# Patient Record
Sex: Male | Born: 1960 | Hispanic: Yes | Marital: Single | Smoking: Former smoker
Health system: Southern US, Community
[De-identification: ages and names within clinical notes are randomized; demographics above are authoritative.]

## PROBLEM LIST (undated history)

## (undated) MED FILL — Doxorubicin HCl Inj 2 MG/ML: INTRAVENOUS | Qty: 11 | Status: AC

---

## 2006-11-22 ENCOUNTER — Ambulatory Visit: Payer: Self-pay | Admitting: Urology

## 2006-12-04 ENCOUNTER — Ambulatory Visit: Payer: Self-pay | Admitting: Internal Medicine

## 2006-12-18 ENCOUNTER — Ambulatory Visit: Payer: Self-pay | Admitting: Family Medicine

## 2006-12-29 ENCOUNTER — Ambulatory Visit: Payer: Self-pay | Admitting: Internal Medicine

## 2007-01-02 ENCOUNTER — Ambulatory Visit: Payer: Self-pay | Admitting: Gastroenterology

## 2007-01-12 ENCOUNTER — Ambulatory Visit: Payer: Self-pay | Admitting: Internal Medicine

## 2007-12-11 IMAGING — US US RENAL KIDNEY
1 series · 17 of 19 positions shown · non-contrast
Comparison: none

REASON FOR EXAM: Flank pain
COMMENTS:

[Series 1: us renal kidney · 17 of 19 slices shown]
[im 1/19]
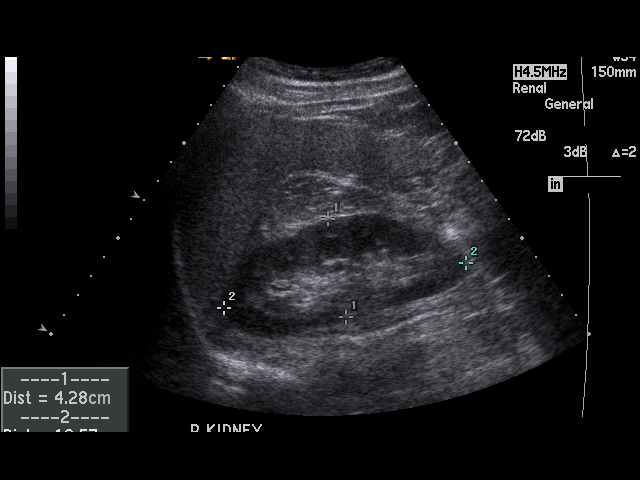
[im 2/19]
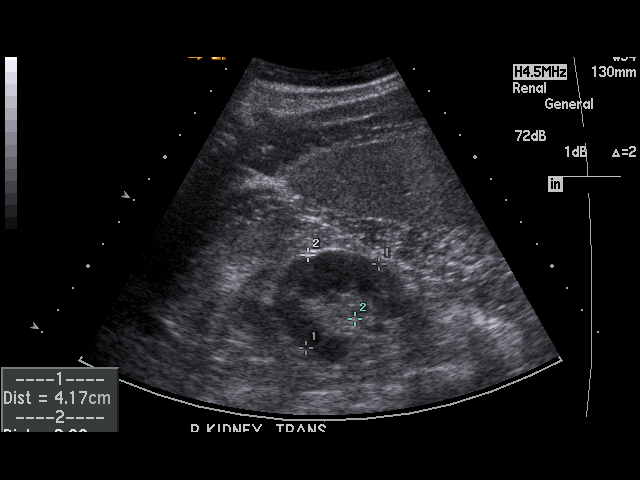
[im 3/19]
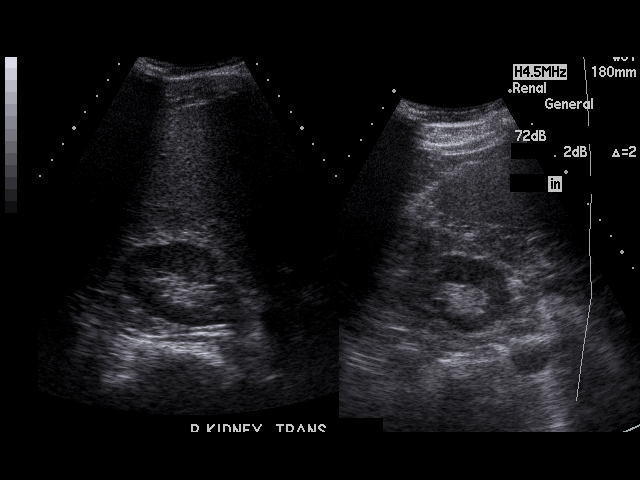
[im 4/19]
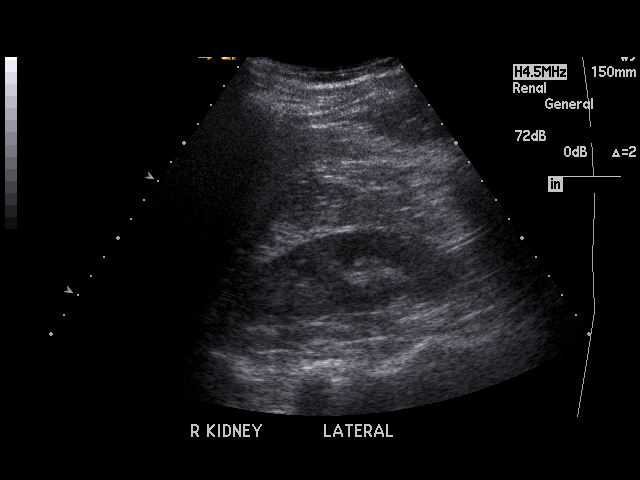
[im 6/19]
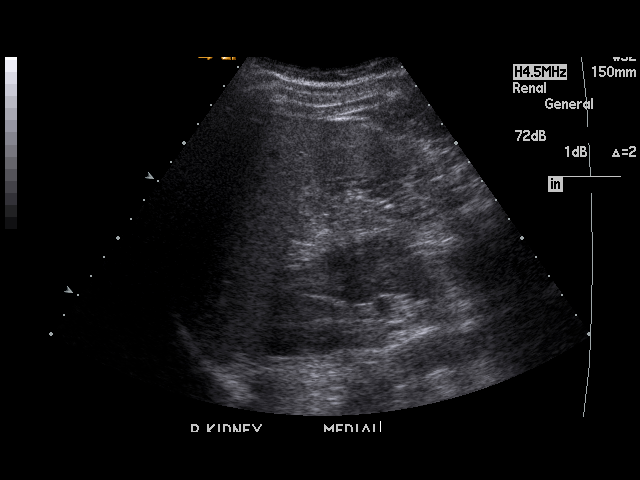
[im 7/19]
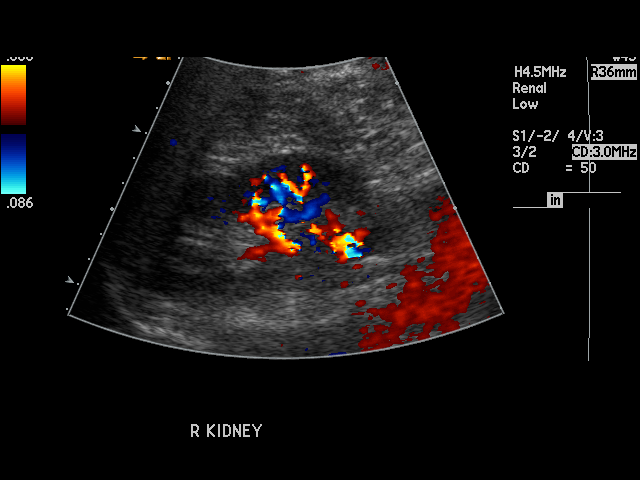
[im 8/19]
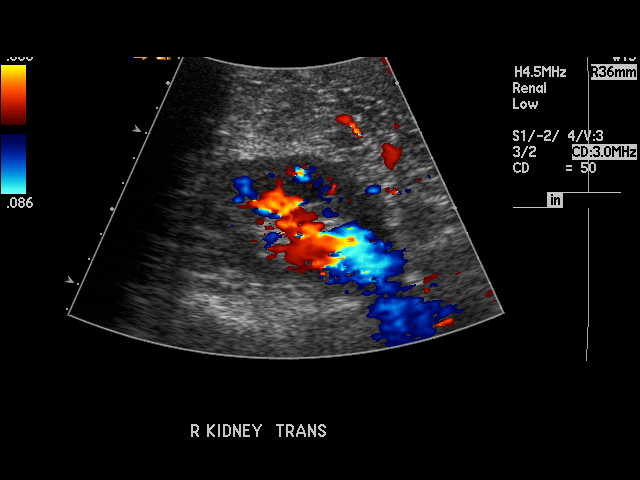
[im 9/19]
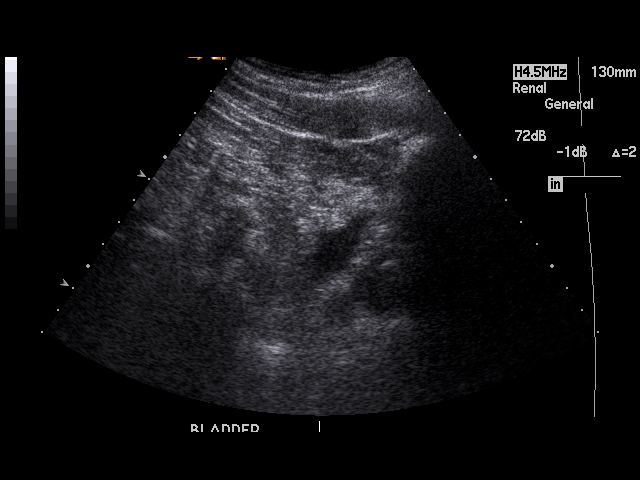
[im 10/19]
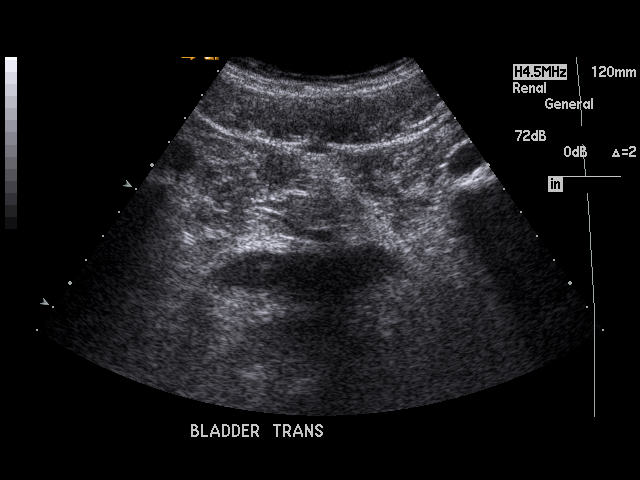
[im 11/19]
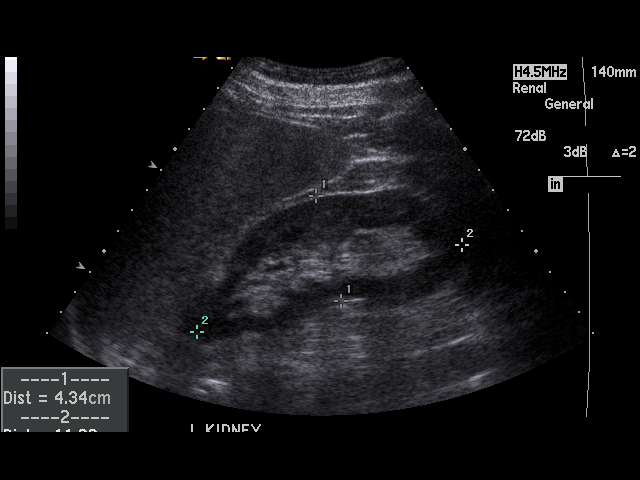
[im 12/19]
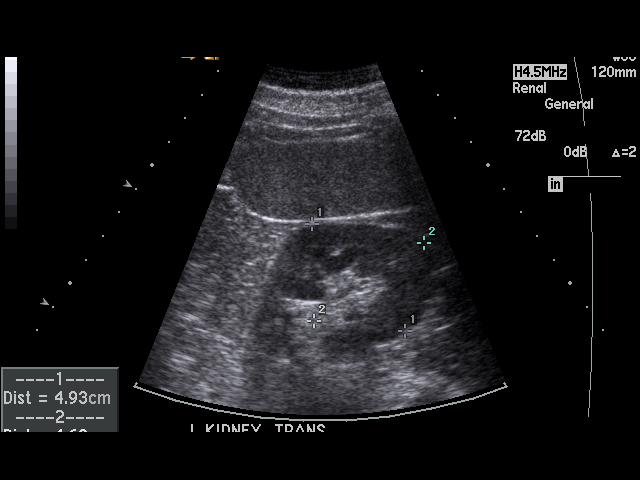
[im 13/19]
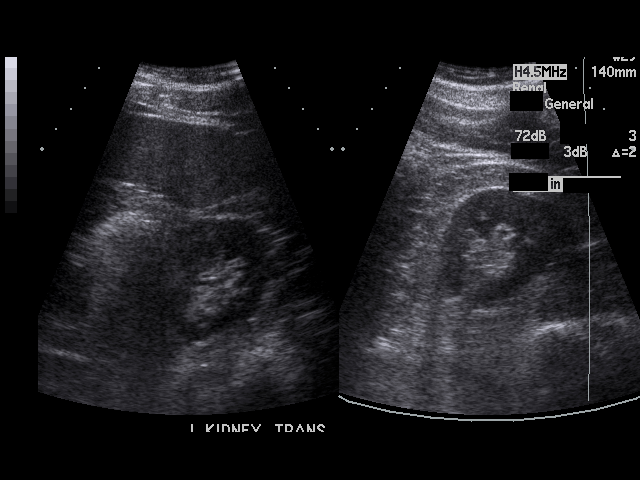
[im 14/19]
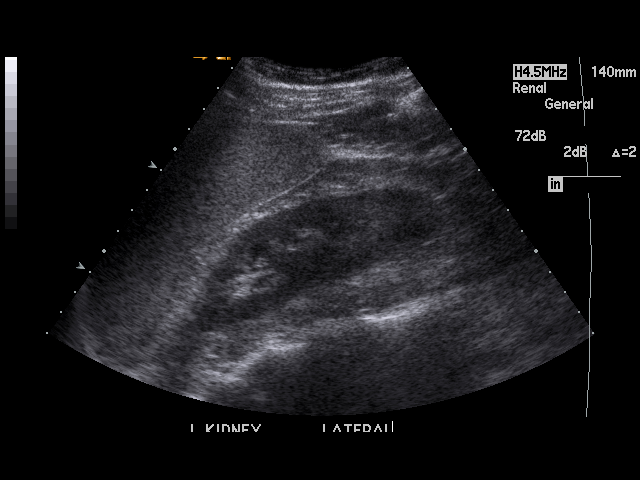
[im 16/19]
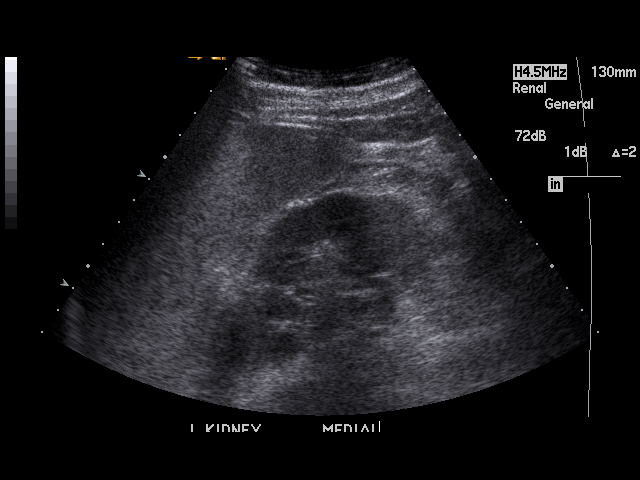
[im 17/19]
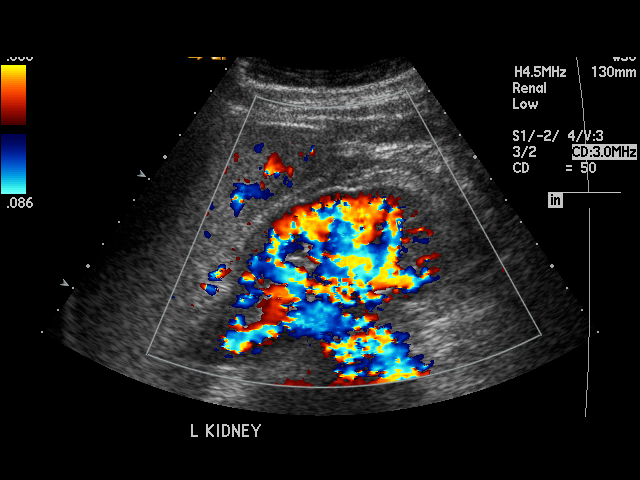
[im 18/19]
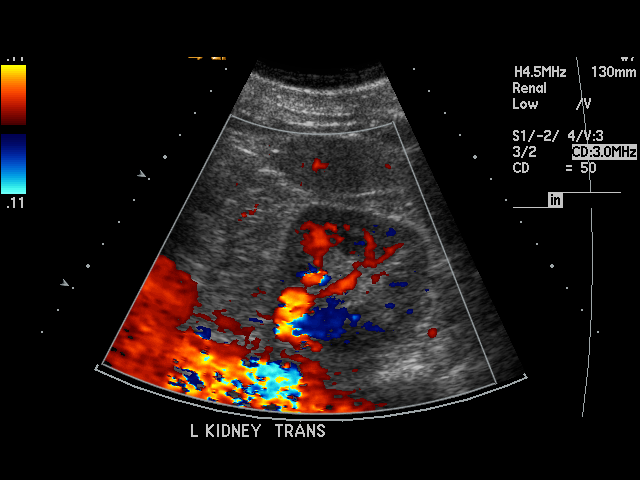
[im 19/19]
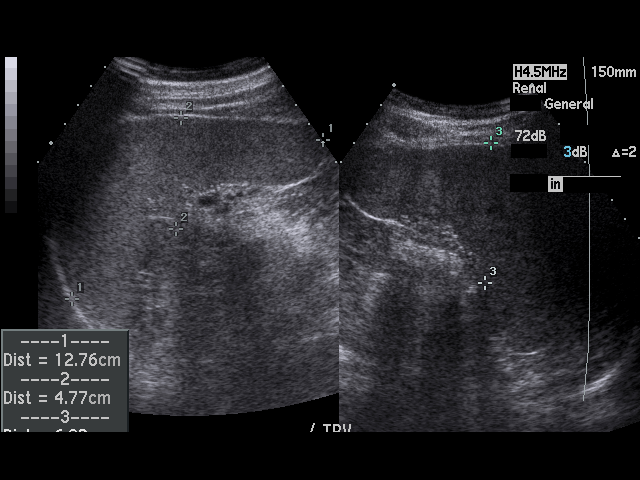

[17 of 19 positions shown; findings below may reference images not displayed]

PROCEDURE:     US  - US KIDNEY BILATERAL  - November 22, 2006  [DATE]

RESULT:       The RIGHT kidney measures 10.57 cm x 4.28 cm x 4.17 cm and the
LEFT kidney measures 11.22 cm x 4.34 cm x 4.69 cm.  No renal mass lesions
are seen.  No renal calcifications are identified.  There is no
hydronephrosis.  The renal cortical margins are smooth.  Incidental note is
made that the spleen is upper limits for normal in size or slightly
enlarged.  The spleen measures approximately 12.75 cm x 4.77 cm x 6.02 cm.
IMPRESSION: 1.     No specific renal abnormalities are identified.
2.     The spleen is upper limits for normal in size or slightly enlarged.

## 2008-01-06 IMAGING — US US PELVIS LIMITED
1 series · 17 of 25 positions shown · non-contrast
Comparison: none

REASON FOR EXAM: Testiculalr pain urethritis
COMMENTS:

[Series 1: us pelvis limited · 17 of 64 slices shown]
[im 1/64]
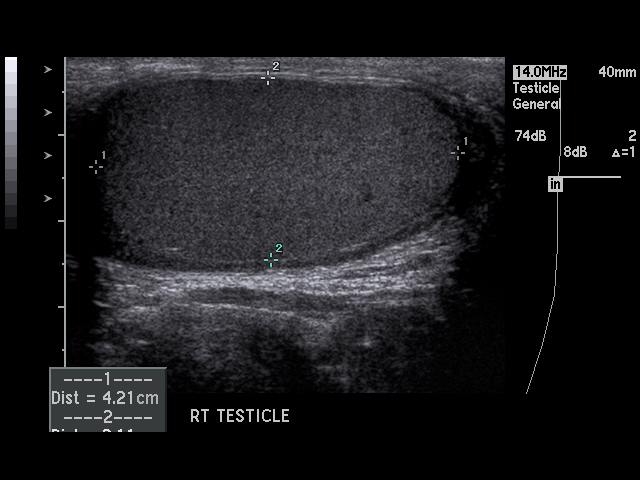
[im 6/64]
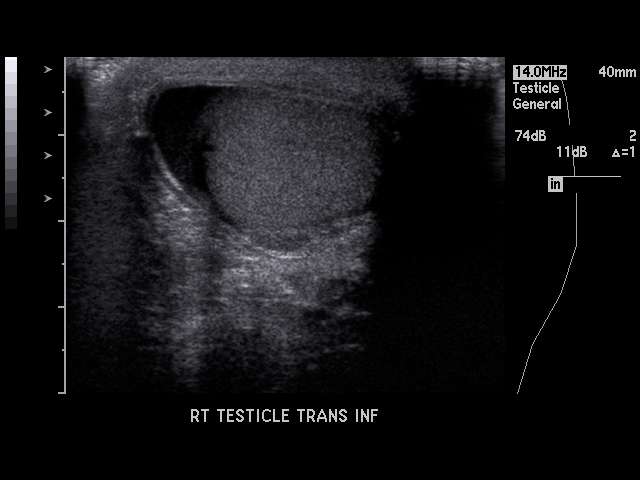
[im 8/64]
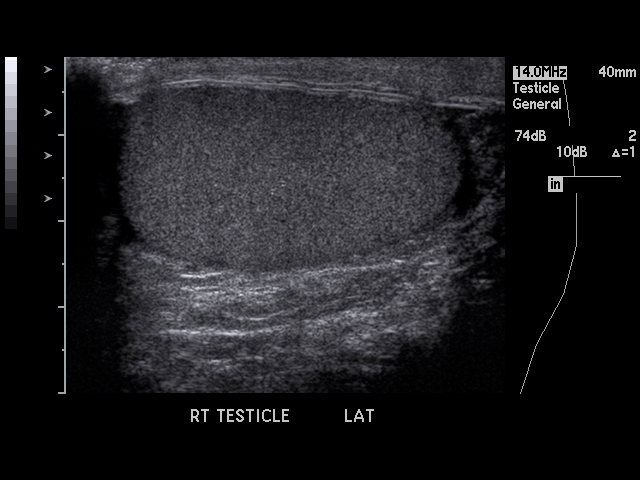
[im 14/64]
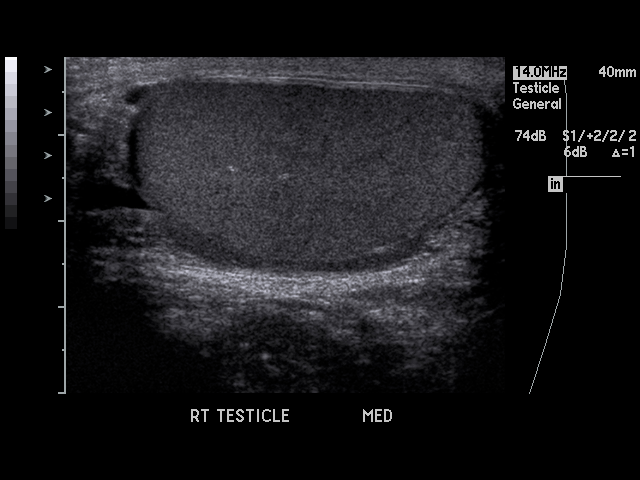
[im 16/64]
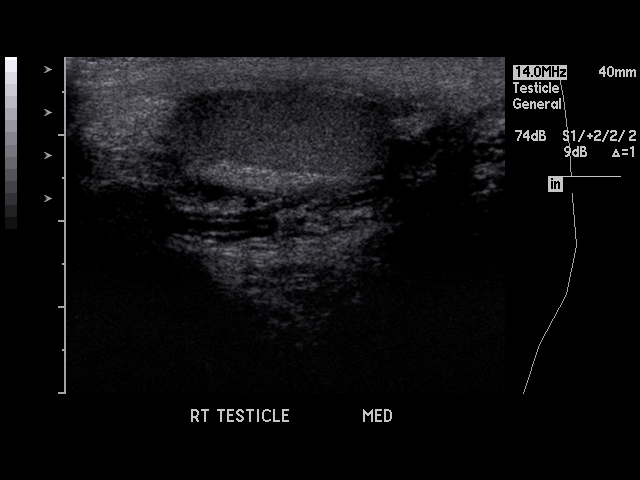
[im 22/64]
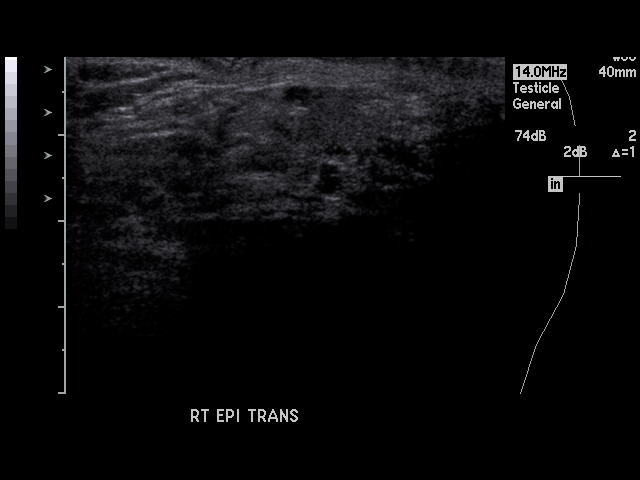
[im 24/64]
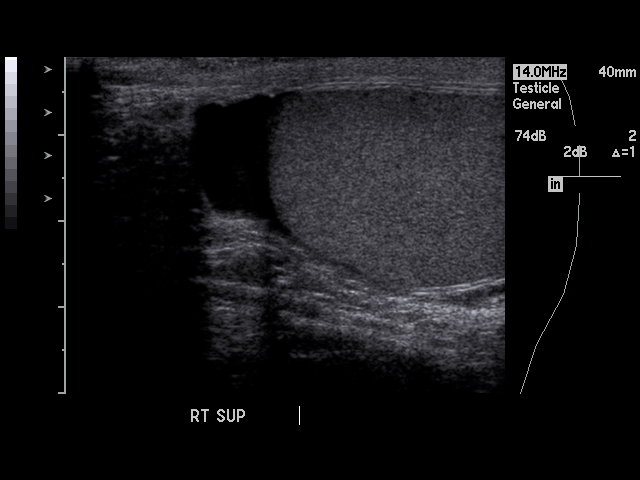
[im 29/64]
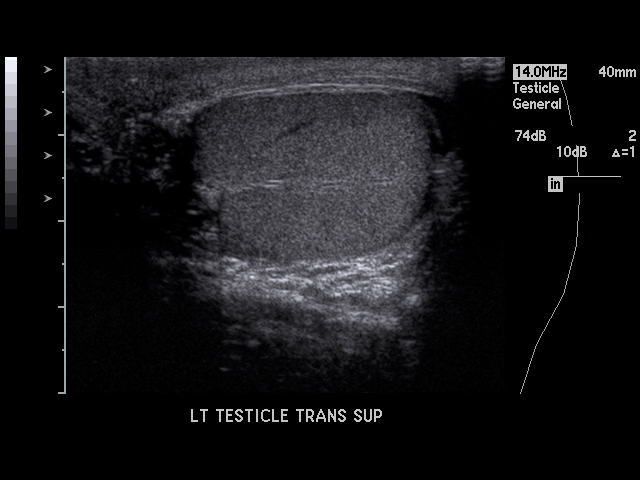
[im 32/64]
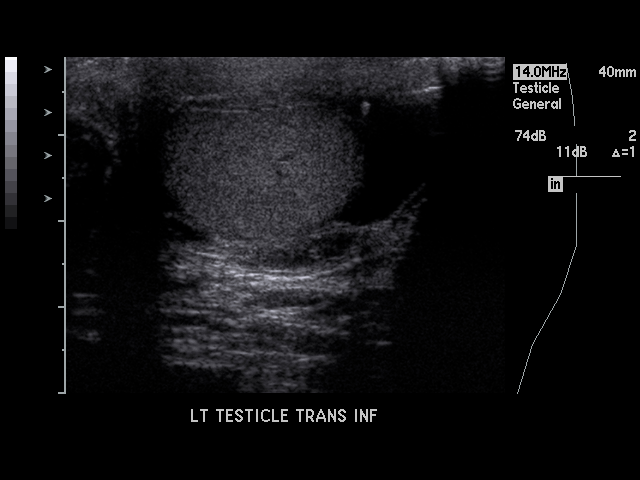
[im 35/64]
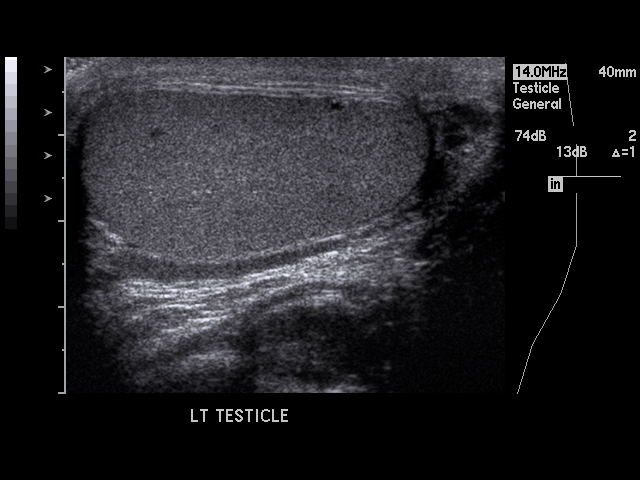
[im 40/64]
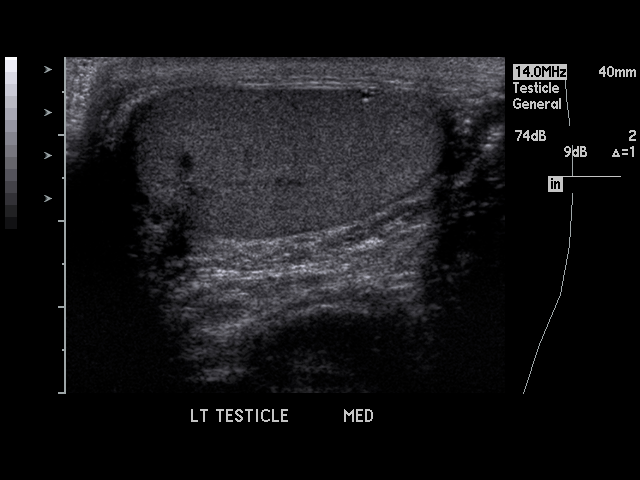
[im 43/64]
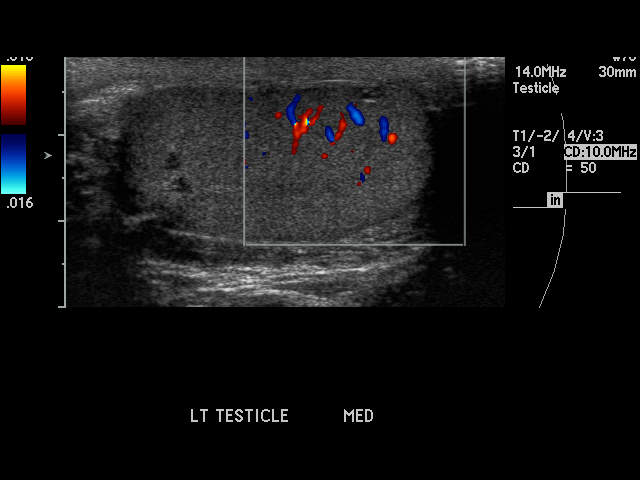
[im 48/64]
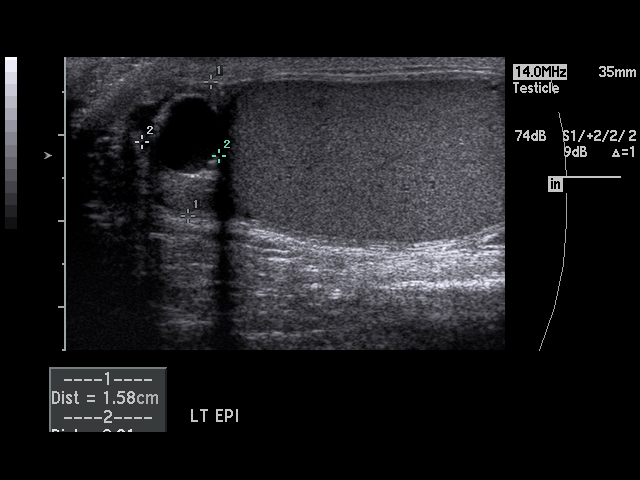
[im 50/64]
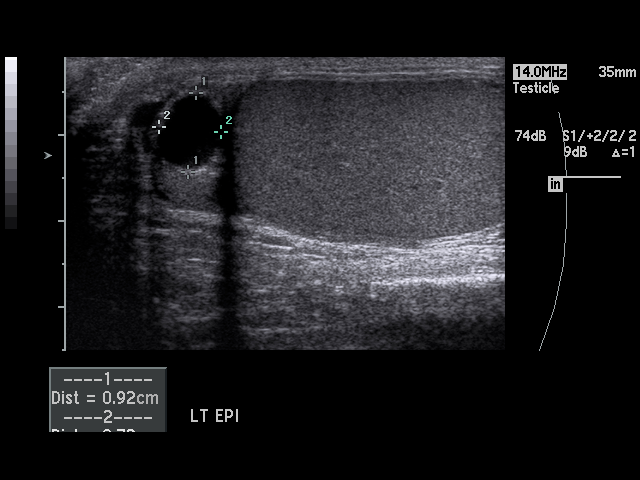
[im 56/64]
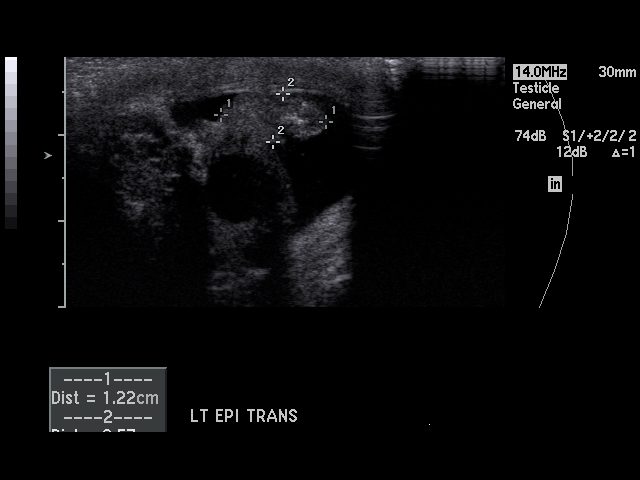
[im 58/64]
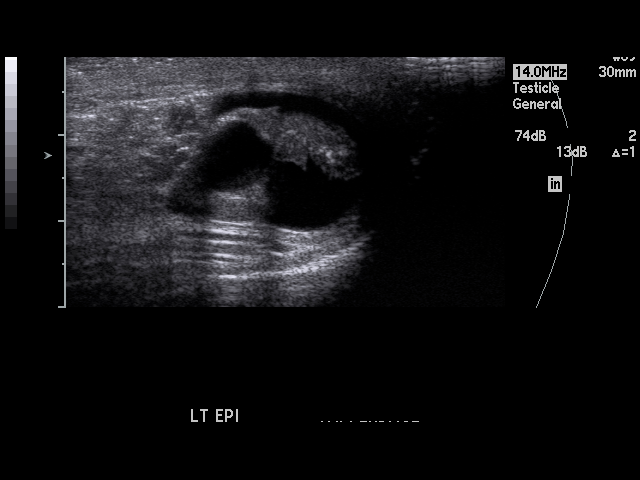
[im 64/64]
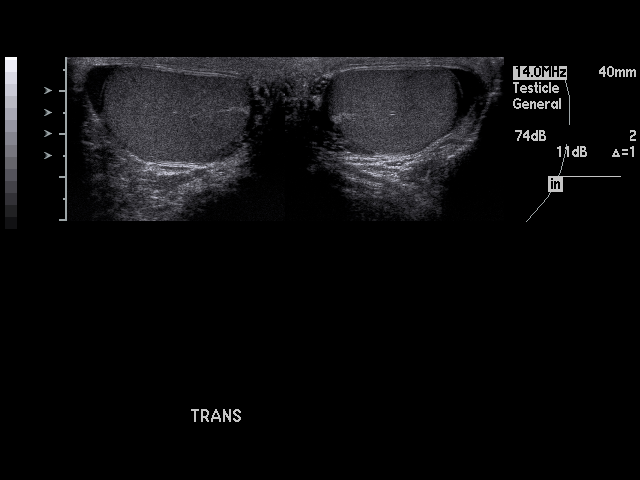

[17 of 25 positions shown; findings below may reference images not displayed]

PROCEDURE:     US  - US TESTICULAR  - December 18, 2006  [DATE]

RESULT:      The patient is complaining of testicular discomfort.

The testicles demonstrate normal echotexture.  No intratesticular masses are
identified.  Vascularity of the testicles is normal.  There are epididymal
cysts present.  These measure less than 1.0 cm in diameter.  There may be a
tiny appendix from the epididymis on the LEFT.  There are small hydroceles
bilaterally.

The RIGHT testicle measures 4.2 x 2.9 x 2.1 cm.  The LEFT testicle measures
4.1 x 2.9 x 2.0 cm.
IMPRESSION: 1.     I do not see evidence of a intratesticular mass.
2.     There are epididymal cysts bilaterally and there are small hydroceles.
3.     The vascularity of the testes is normal and symmetrical.

## 2016-10-18 ENCOUNTER — Encounter: Payer: Self-pay | Admitting: Emergency Medicine

## 2016-10-18 ENCOUNTER — Emergency Department: Payer: No Typology Code available for payment source

## 2016-10-18 ENCOUNTER — Emergency Department
Admission: EM | Admit: 2016-10-18 | Discharge: 2016-10-18 | Disposition: A | Discharge status: Home / Self Care | Payer: No Typology Code available for payment source | Attending: Emergency Medicine | Admitting: Emergency Medicine

## 2016-10-18 DIAGNOSIS — S46911A Strain of unspecified muscle, fascia and tendon at shoulder and upper arm level, right arm, initial encounter: Secondary | ICD-10-CM | POA: Insufficient documentation

## 2016-10-18 DIAGNOSIS — Y92410 Unspecified street and highway as the place of occurrence of the external cause: Secondary | ICD-10-CM | POA: Diagnosis not present

## 2016-10-18 DIAGNOSIS — S161XXA Strain of muscle, fascia and tendon at neck level, initial encounter: Secondary | ICD-10-CM

## 2016-10-18 DIAGNOSIS — Y9389 Activity, other specified: Secondary | ICD-10-CM | POA: Diagnosis not present

## 2016-10-18 DIAGNOSIS — Y999 Unspecified external cause status: Secondary | ICD-10-CM | POA: Diagnosis not present

## 2016-10-18 DIAGNOSIS — M7918 Myalgia, other site: Secondary | ICD-10-CM

## 2016-10-18 DIAGNOSIS — S199XXA Unspecified injury of neck, initial encounter: Secondary | ICD-10-CM | POA: Diagnosis present

## 2016-10-18 MED ORDER — CYCLOBENZAPRINE HCL 10 MG PO TABS
10.0000 mg | ORAL_TABLET | Freq: Once | ORAL | Status: DC
  Filled 2016-10-18: qty 1

## 2016-10-18 MED ORDER — IBUPROFEN 600 MG PO TABS
ORAL_TABLET | ORAL | Status: AC
  Filled 2016-10-18: qty 1

## 2016-10-18 MED ORDER — IBUPROFEN 600 MG PO TABS: 600.0000 mg | ORAL_TABLET | Freq: Three times a day (TID) | ORAL | 0 refills | Status: DC | PRN

## 2016-10-18 MED ORDER — TRAMADOL HCL 50 MG PO TABS: 50.0000 mg | ORAL_TABLET | Freq: Four times a day (QID) | ORAL | 0 refills | Status: DC | PRN

## 2016-10-18 MED ORDER — IBUPROFEN 100 MG/5ML PO SUSP: 600.0000 mg | Freq: Once | ORAL | Status: DC

## 2016-10-18 MED ORDER — CYCLOBENZAPRINE HCL 10 MG PO TABS: 10.0000 mg | ORAL_TABLET | Freq: Three times a day (TID) | ORAL | 0 refills | Status: DC | PRN

## 2016-10-18 MED ORDER — IBUPROFEN 600 MG PO TABS: 600.0000 mg | ORAL_TABLET | Freq: Once | ORAL | Status: DC

## 2016-10-18 MED ORDER — TRAMADOL HCL 50 MG PO TABS
50.0000 mg | ORAL_TABLET | Freq: Once | ORAL | Status: DC
  Filled 2016-10-18: qty 1
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
## 2016-12-27 DIAGNOSIS — K648 Other hemorrhoids: Secondary | ICD-10-CM | POA: Diagnosis not present
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
## 2017-12-01 DIAGNOSIS — Z13220 Encounter for screening for lipoid disorders: Secondary | ICD-10-CM | POA: Diagnosis not present

## 2017-12-01 DIAGNOSIS — Z0001 Encounter for general adult medical examination with abnormal findings: Secondary | ICD-10-CM | POA: Diagnosis not present

## 2017-12-28 DIAGNOSIS — K603 Anal fistula: Secondary | ICD-10-CM | POA: Diagnosis not present

## 2017-12-29 ENCOUNTER — Ambulatory Visit: Payer: Self-pay | Admitting: General Surgery

## 2017-12-29 NOTE — H&P (Signed)
PATIENT PROFILE: William Mathews is a 57 y.o. male who presents to the Clinic for consultation at the request of Dr. Edwina Barth for evaluation of hemorrhoids.  PCP:  Velna Ochs, MD  HISTORY OF PRESENT ILLNESS: William Mathews reports feeling a painful area on the right posterior anal area. Patient refers that it "popped spontaneously" and drained a purulent drainage. After that he has not feel pain but has to drain the area daily. Every day, the area gets filled with secretion and he drained with his finger. The patient refers that after draining the area he feels well and has no pain. Patient do refers that he needs to put toilet paper on the underwear to absorb secretions during the day. Denies fever or chills. Pain improved with drainage every day. Pain gets worse with drainage accumulation. Denies fever or chills.  Pain does not radiates to other part of the body. Denies rectal bleeding.    PROBLEM LIST:         Problem List  Date Reviewed: 12/01/2017         Noted   Hemorrhoids, external 12/01/2017   ED (erectile dysfunction) Unknown      GENERAL REVIEW OF SYSTEMS:   General ROS: negative for - chills, fatigue, fever, weight gain or weight loss Allergy and Immunology ROS: negative for - hives  Hematological and Lymphatic ROS: negative for - bleeding problems or bruising, negative for palpable nodes Endocrine ROS: negative for - heat or cold intolerance, hair changes Respiratory ROS: negative for - cough, shortness of breath or wheezing Cardiovascular ROS: no chest pain or palpitations GI ROS: negative for nausea, vomiting, abdominal pain, diarrhea, constipation Musculoskeletal ROS: negative for - joint swelling or muscle pain Neurological ROS: negative for - confusion, syncope Dermatological ROS: negative for pruritus and rash Psychiatric: negative for anxiety, depression, difficulty sleeping and memory loss  MEDICATIONS: Current Medications        Current Outpatient  Medications  Medication Sig Dispense Refill  . hydrocortisone (ANUSOL-HC) 25 mg suppository      No current facility-administered medications for this visit.       ALLERGIES: Patient has no known allergies.  PAST MEDICAL HISTORY:     Past Medical History:  Diagnosis Date  . ED (erectile dysfunction)   . ED (erectile dysfunction)     PAST SURGICAL HISTORY: History reviewed. No pertinent surgical history.   FAMILY HISTORY:      Family History  Problem Relation Age of Onset  . Alcohol abuse Father      SOCIAL HISTORY: Social History        Socioeconomic History  . Marital status: Married    Spouse name: Not on file  . Number of children: Not on file  . Years of education: Not on file  . Highest education level: Not on file  Social Needs  . Financial resource strain: Not on file  . Food insecurity - worry: Not on file  . Food insecurity - inability: Not on file  . Transportation needs - medical: Not on file  . Transportation needs - non-medical: Not on file  Occupational History  . Not on file  Tobacco Use  . Smoking status: Former Research scientist (life sciences)  . Smokeless tobacco: Never Used  Substance and Sexual Activity  . Alcohol use: No    Alcohol/week: 0.0 oz  . Drug use: Not on file  . Sexual activity: Not on file  Other Topics Concern  . Not on file  Social History Narrative  .  Not on file    PHYSICAL EXAM:    Vitals:   12/28/17 1505  BP: 127/76  Pulse: 79  Temp: 36.8 C (98.2 F)   Body mass index is 31.57 kg/m. Weight: 80.8 kg (178 lb 3.2 oz)   GENERAL: Alert, active, oriented x3  HEENT: Pupils equal reactive to light. Extraocular movements are intact. Sclera clear. Palpebral conjunctiva normal red color.Pharynx clear.  NECK: Supple with no palpable mass and no adenopathy.  LUNGS: Sound clear with no rales rhonchi or wheezes.  HEART: Regular rhythm S1 and S2 without murmur.  ABDOMEN: Soft and depressible, nontender with  no palpable mass, no hepatomegaly.   RECTAL: posterior fistula opening with purulent secretions. Fistula probed but to painful to found the internal opening. No significant hemorrhoid palpated.   EXTREMITIES: Well-developed well-nourished symmetrical with no dependent edema.  NEUROLOGICAL: Awake alert oriented, facial expression symmetrical, moving all extremities.  REVIEW OF DATA: I have reviewed the following data today:      Office Visit on 12/01/2017  Component Date Value  . WBC (White Blood Cell Co* 12/01/2017 8.9   . RBC (Red Blood Cell Coun* 12/01/2017 4.84   . Hemoglobin 12/01/2017 14.9   . Hematocrit 12/01/2017 43.7   . MCV (Mean Corpuscular Vo* 12/01/2017 90.3   . MCH (Mean Corpuscular He* 12/01/2017 30.8   . MCHC (Mean Corpuscular H* 12/01/2017 34.1   . Platelet Count 12/01/2017 225   . RDW-CV (Red Cell Distrib* 12/01/2017 12.3   . MPV (Mean Platelet Volum* 12/01/2017 11.8   . Neutrophils 12/01/2017 6.20   . Lymphocytes 12/01/2017 1.78   . Monocytes 12/01/2017 0.67   . Eosinophils 12/01/2017 0.18   . Basophils 12/01/2017 0.03   . Neutrophil % 12/01/2017 69.8   . Lymphocyte % 12/01/2017 20.0   . Monocyte % 12/01/2017 7.5   . Eosinophil % 12/01/2017 2.0   . Basophil% 12/01/2017 0.3   . Immature Granulocyte % 12/01/2017 0.4   . Immature Granulocyte Cou* 12/01/2017 0.04   . Glucose 12/01/2017 86   . Sodium 12/01/2017 140   . Potassium 12/01/2017 4.0   . Chloride 12/01/2017 103   . Carbon Dioxide (CO2) 12/01/2017 28.8   . Urea Nitrogen (BUN) 12/01/2017 15   . Creatinine 12/01/2017 1.0   . Glomerular Filtration Ra* 12/01/2017 77   . Calcium 12/01/2017 9.2   . AST  12/01/2017 24   . ALT  12/01/2017 30   . Alk Phos (alkaline Phosp* 12/01/2017 63   . Albumin 12/01/2017 4.3   . Bilirubin, Total 12/01/2017 0.4   . Protein, Total 12/01/2017 7.1   . A/G Ratio 12/01/2017 1.5      ASSESSMENT: William Mathews is a 56 y.o. male presenting for consultation for  hemorrhoids. On physical exam the patient was not found with hemorrhoid but he has perianal fistula. Dr. Smith assisted on the physical exam probing the fistula canal but it was to tender for the patient to be able to find the internal opening. 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 Patient was oriented about the surgical procedure, fistulotomy, its benefits and its complication. Having the discussion between the patient, Dr. Smith and myself we agree that the patient will benefit to proceed with fistulotomy to heal the fistula.   PLAN: 1. Schedule for rectal exam under anesthesia (45990) and fistulotomy (46275) 2. CBC, CMP - already done 3. Internal Medicine clearance  4. Follow up staff instruction regarding surgery pre admission process  Patient   verbalized understanding, all questions were answered, and were agreeable with the plan outlined above.   Pressley Barsky Cintron-Diaz, MD  Electronically signed by Kennon Encinas Cintron-Diaz, MD  

## 2017-12-29 NOTE — H&P (View-Only) (Signed)
PATIENT PROFILE: NYLE LIMB is a 57 y.o. male who presents to the Clinic for consultation at the request of Dr. Edwina Barth for evaluation of hemorrhoids.  PCP:  Velna Ochs, MD  HISTORY OF PRESENT ILLNESS: [name] reports feeling a painful area on the right posterior anal area. Patient refers that it "popped spontaneously" and drained a purulent drainage. After that he has not feel pain but has to drain the area daily. Every day, the area gets filled with secretion and he drained with his finger. The patient refers that after draining the area he feels well and has no pain. Patient do refers that he needs to put toilet paper on the underwear to absorb secretions during the day. Denies fever or chills. Pain improved with drainage every day. Pain gets worse with drainage accumulation. Denies fever or chills.  Pain does not radiates to other part of the body. Denies rectal bleeding.    PROBLEM LIST:         Problem List  Date Reviewed: 12/01/2017         Noted   Hemorrhoids, external 12/01/2017   ED (erectile dysfunction) Unknown      GENERAL REVIEW OF SYSTEMS:   General ROS: negative for - chills, fatigue, fever, weight gain or weight loss Allergy and Immunology ROS: negative for - hives  Hematological and Lymphatic ROS: negative for - bleeding problems or bruising, negative for palpable nodes Endocrine ROS: negative for - heat or cold intolerance, hair changes Respiratory ROS: negative for - cough, shortness of breath or wheezing Cardiovascular ROS: no chest pain or palpitations GI ROS: negative for nausea, vomiting, abdominal pain, diarrhea, constipation Musculoskeletal ROS: negative for - joint swelling or muscle pain Neurological ROS: negative for - confusion, syncope Dermatological ROS: negative for pruritus and rash Psychiatric: negative for anxiety, depression, difficulty sleeping and memory loss  MEDICATIONS: Current Medications        Current Outpatient  Medications  Medication Sig Dispense Refill  . hydrocortisone (ANUSOL-HC) 25 mg suppository      No current facility-administered medications for this visit.       ALLERGIES: Patient has no known allergies.  PAST MEDICAL HISTORY:     Past Medical History:  Diagnosis Date  . ED (erectile dysfunction)   . ED (erectile dysfunction)     PAST SURGICAL HISTORY: History reviewed. No pertinent surgical history.   FAMILY HISTORY:      Family History  Problem Relation Age of Onset  . Alcohol abuse Father      SOCIAL HISTORY: Social History        Socioeconomic History  . Marital status: Married    Spouse name: Not on file  . Number of children: Not on file  . Years of education: Not on file  . Highest education level: Not on file  Social Needs  . Financial resource strain: Not on file  . Food insecurity - worry: Not on file  . Food insecurity - inability: Not on file  . Transportation needs - medical: Not on file  . Transportation needs - non-medical: Not on file  Occupational History  . Not on file  Tobacco Use  . Smoking status: Former Research scientist (life sciences)  . Smokeless tobacco: Never Used  Substance and Sexual Activity  . Alcohol use: No    Alcohol/week: 0.0 oz  . Drug use: Not on file  . Sexual activity: Not on file  Other Topics Concern  . Not on file  Social History Narrative  .  Not on file    PHYSICAL EXAM:    Vitals:   12/28/17 1505  BP: 127/76  Pulse: 79  Temp: 36.8 C (98.2 F)   Body mass index is 31.57 kg/m. Weight: 80.8 kg (178 lb 3.2 oz)   GENERAL: Alert, active, oriented x3  HEENT: Pupils equal reactive to light. Extraocular movements are intact. Sclera clear. Palpebral conjunctiva normal red color.Pharynx clear.  NECK: Supple with no palpable mass and no adenopathy.  LUNGS: Sound clear with no rales rhonchi or wheezes.  HEART: Regular rhythm S1 and S2 without murmur.  ABDOMEN: Soft and depressible, nontender with  no palpable mass, no hepatomegaly.   RECTAL: posterior fistula opening with purulent secretions. Fistula probed but to painful to found the internal opening. No significant hemorrhoid palpated.   EXTREMITIES: Well-developed well-nourished symmetrical with no dependent edema.  NEUROLOGICAL: Awake alert oriented, facial expression symmetrical, moving all extremities.  REVIEW OF DATA: I have reviewed the following data today:      Office Visit on 12/01/2017  Component Date Value  . WBC (White Blood Cell Co* 12/01/2017 8.9   . RBC (Red Blood Cell Coun* 12/01/2017 4.84   . Hemoglobin 12/01/2017 14.9   . Hematocrit 12/01/2017 43.7   . MCV (Mean Corpuscular Vo* 12/01/2017 90.3   . MCH (Mean Corpuscular He* 12/01/2017 30.8   . MCHC (Mean Corpuscular H* 12/01/2017 34.1   . Platelet Count 12/01/2017 225   . RDW-CV (Red Cell Distrib* 12/01/2017 12.3   . MPV (Mean Platelet Volum* 12/01/2017 11.8   . Neutrophils 12/01/2017 6.20   . Lymphocytes 12/01/2017 1.78   . Monocytes 12/01/2017 0.67   . Eosinophils 12/01/2017 0.18   . Basophils 12/01/2017 0.03   . Neutrophil % 12/01/2017 69.8   . Lymphocyte % 12/01/2017 20.0   . Monocyte % 12/01/2017 7.5   . Eosinophil % 12/01/2017 2.0   . Basophil% 12/01/2017 0.3   . Immature Granulocyte % 12/01/2017 0.4   . Immature Granulocyte Cou* 12/01/2017 0.04   . Glucose 12/01/2017 86   . Sodium 12/01/2017 140   . Potassium 12/01/2017 4.0   . Chloride 12/01/2017 103   . Carbon Dioxide (CO2) 12/01/2017 28.8   . Urea Nitrogen (BUN) 12/01/2017 15   . Creatinine 12/01/2017 1.0   . Glomerular Filtration Ra* 12/01/2017 77   . Calcium 12/01/2017 9.2   . AST  12/01/2017 24   . ALT  12/01/2017 30   . Alk Phos (alkaline Phosp* 12/01/2017 63   . Albumin 12/01/2017 4.3   . Bilirubin, Total 12/01/2017 0.4   . Protein, Total 12/01/2017 7.1   . A/G Ratio 12/01/2017 1.5      ASSESSMENT: Mr. Bachar is a 57 y.o. male presenting for consultation for  hemorrhoids. On physical exam the patient was not found with hemorrhoid but he has perianal fistula. Dr. Tamala Julian assisted on the physical exam probing the fistula canal but it was to tender for the patient to be able to find the internal opening. 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 Patient was oriented about the surgical procedure, fistulotomy, its benefits and its complication. Having the discussion between the patient, Dr. Tamala Julian and myself we agree that the patient will benefit to proceed with fistulotomy to heal the fistula.   PLAN: 1. Schedule for rectal exam under anesthesia (50093) and fistulotomy (81829) 2. CBC, CMP - already done 3. Internal Medicine clearance  4. Follow up staff instruction regarding surgery pre admission process  Patient  verbalized understanding, all questions were answered, and were agreeable with the plan outlined above.   Herbert Pun, MD  Electronically signed by Herbert Pun, MD

## 2018-01-03 ENCOUNTER — Ambulatory Visit: Payer: Self-pay | Admitting: General Surgery

## 2018-01-04 ENCOUNTER — Other Ambulatory Visit: Payer: Self-pay

## 2018-01-04 ENCOUNTER — Encounter
Admission: RE | Admit: 2018-01-04 | Discharge: 2018-01-04 | Disposition: A | Discharge status: Home / Self Care | Payer: 59 | Source: Ambulatory Visit | Attending: General Surgery | Admitting: General Surgery

## 2018-01-04 NOTE — Patient Instructions (Signed)
Your procedure is scheduled on: Su procedimiento est programado para: 01/10/2018 Report to [facility] P[facility] Presntese a: To find out your arrival time please call [phone] between 1PM - 3PM on . Para saber su hora de llegada por favor llame al [phone] entre la 1PM - 3PM el da: 01/09/2018  Remember: Instructions that are not followed completely may result in serious medical risk, up to and including death, or upon the discretion of your surgeon and anesthesiologist your surgery may need to be rescheduled.  Recuerde: Las instrucciones que no se siguen completamente Armed forces logistics/support/administrative officerpueden resultar en un riesgo de salud grave, incluyendo hasta la [place] o a discrecin de su cirujano y Scientific laboratory techniciananestesilogo, su ciruga se puede posponer.   __X_ 1.Do not eat food after midnight the night before your procedure. No   gum chewing or hard candies. You may drink clear liquids up to 2 hours   before you are scheduled to arrive for your surgery- DO not drink clear   liquids within 2 hours of the start of your surgery.     Clear Liquids include:    water, apple juice without pulp, clear carbohydrate drink such as    Clearfast of Gartorade, Black Coffee or Tea (Do not add anything to   coffee or tea).      No coma nada despus de la medianoche de la noche anterior a su procedimiento. No coma chicles ni caramelos duros. Puede tomar lquidos claros hasta 2 horas antes de su hora programada de llegada al   hospital para su procedimiento. No tome lquidos claros durante el  transcurso de las 2 horas de su llegada programada al hospital para su procedimiento, ya que esto puede llevar a que su procedimiento se retrase o tenga que volver a Magazine features editorprogramarse.  Los lquidos claros incluyen:         - Agua o jugo de [place] sin pulpa         - Bebidas claras con carbohidratos como ClearFast o Gatorade         - Caf negro o t claro (sin leche, sin cremas, no agregue nada al caf ni al  t)  No tome nada que no est en esta  lista.  Los pacientes con diabetes tipo 1 y tipo 2 solo deben Printmakertomar agua.  Llame a la clnica de PreCare o a la unidad de Same Day Surgery si  tiene alguna pregunta sobre estas instrucciones.              _X__ 2.Do Not Smoke or use e-cigarettes For 24 Hours Prior to Your    Surgery.  Do not use any chewable tobacco products for at least 6   hours prior to surgery.    No fume ni use cigarrillos electrnicos durante las 24 horas previas   a su [place].  No use ningn producto de tabaco masticable durante  al menos 6 horas antes de la [place].     __X_ 3. No alcohol for 24 hours before or after surgery.    No tome alcohol durante las 24 horas antes ni despus de la [place].   ____4. Bring all medications with you on the day of surgery if instructed.    Lleve todos los medicamentos neuvos con usted el da de su ciruga si se le   ha indicado as.   ____ 5. Notify your doctor if there is any change in your medical condition (cold,   fever, infections).    Informe a su mdico  si hay algn cambio en su condicin mdica   (resfriado, fiebre, infecciones).   Do not wear jewelry, make-up, hairpins, clips or nail polish.  No use joyas, maquillajes, pinzas/ganchos para el cabello ni esmalte de uas.  Do not wear lotions, powders, or perfumes. You may  Not wear deodorant.  No use lociones, polvos o perfumes. No Puede usar desodorante.    Do not shave 48 hours prior to surgery. Men may shave face and neck.  No se afeite 48 horas antes de la [place].  Los hombres pueden [employer] cara  y el cuello.   Do not bring valuables to the hospital.   No lleve objetos de valor al hospital.  [facility] is not responsible for any belongings or valuables.  [place] no se hace responsable de ningn tipo de pertenencias u objetos de Licensed conveyancer.               Contacts, dentures or bridgework may not be worn into surgery.  Los lentes de [place], las dentaduras postizas o puentes no se pueden usar en la  [place].     For patients admitted to the hospital, discharge time is determined by your treatment team.  Para los pacientes que sean ingresados al hospital, el tiempo en el cual se le dar de alta es determinado por su equipo de [place].   Patients discharged the day of surgery will not be allowed to drive home. A los pacientes que se les da de alta el mismo da de la ciruga no se les permitir conducir a Higher education careers adviser.   Please read over the following fact sheets that you were given: Por favor lea las siguientes hojas de informacin que le dieron:      __x__ Take these medicines the morning of surgery with A SIP OF WATER:          [employer] medicinas la maana de la ciruga con UN SORBO DE AGUA:  1. Nada  2.   3.   4.       5.  6.   __x__ Stop Anti-inflammatories           Deje de tomar antiinflamatorios el da:  Tome solo Tylenol si lo necesita   __x__ Stop supplements until after surgery            Deje de tomar suplementos hasta despus de la [place]

## 2018-01-04 NOTE — Pre-Procedure Instructions (Signed)
Clearance request faxed to Dr Letitia LibraJohnston office

## 2018-01-09 MED ORDER — METRONIDAZOLE IN NACL 5-0.79 MG/ML-% IV SOLN
500.0000 mg | INTRAVENOUS | Status: AC
  Administered 2018-01-10: 10 mg via INTRAVENOUS
  Filled 2018-01-09: qty 100

## 2018-01-09 MED ORDER — CEFAZOLIN SODIUM-DEXTROSE 2-4 GM/100ML-% IV SOLN
2.0000 g | INTRAVENOUS | Status: AC
  Administered 2018-01-10: 2 g via INTRAVENOUS

## 2018-01-10 ENCOUNTER — Ambulatory Visit: Payer: Commercial Managed Care - HMO | Admitting: Anesthesiology

## 2018-01-10 ENCOUNTER — Ambulatory Visit
Admission: RE | Admit: 2018-01-10 | Discharge: 2018-01-10 | Disposition: A | Discharge status: Home / Self Care | Payer: Commercial Managed Care - HMO | Source: Ambulatory Visit | Attending: General Surgery | Admitting: General Surgery

## 2018-01-10 ENCOUNTER — Other Ambulatory Visit: Payer: Self-pay

## 2018-01-10 ENCOUNTER — Encounter: Payer: Self-pay | Admitting: *Deleted

## 2018-01-10 ENCOUNTER — Encounter
Admission: RE | Disposition: A | Discharge status: Home / Self Care | Payer: Self-pay | Source: Ambulatory Visit | Attending: General Surgery

## 2018-01-10 DIAGNOSIS — K640 First degree hemorrhoids: Secondary | ICD-10-CM | POA: Diagnosis not present

## 2018-01-10 DIAGNOSIS — N529 Male erectile dysfunction, unspecified: Secondary | ICD-10-CM | POA: Diagnosis not present

## 2018-01-10 DIAGNOSIS — Z87891 Personal history of nicotine dependence: Secondary | ICD-10-CM | POA: Insufficient documentation

## 2018-01-10 DIAGNOSIS — K603 Anal fistula: Secondary | ICD-10-CM | POA: Diagnosis not present

## 2018-01-10 DIAGNOSIS — K620 Anal polyp: Secondary | ICD-10-CM | POA: Diagnosis not present

## 2018-01-10 HISTORY — PX: FISTULOTOMY: SHX6413

## 2018-01-10 LAB — URINE DRUG SCREEN, QUALITATIVE (ARMC ONLY)
Amphetamines, Ur Screen: NOT DETECTED
BARBITURATES, UR SCREEN: NOT DETECTED
Benzodiazepine, Ur Scrn: NOT DETECTED
COCAINE METABOLITE, UR ~~LOC~~: NOT DETECTED
Cannabinoid 50 Ng, Ur ~~LOC~~: NOT DETECTED
MDMA (Ecstasy)Ur Screen: NOT DETECTED
Methadone Scn, Ur: NOT DETECTED
Opiate, Ur Screen: NOT DETECTED
Phencyclidine (PCP) Ur S: NOT DETECTED
TRICYCLIC, UR SCREEN: NOT DETECTED

## 2018-01-10 SURGERY — FISTULOTOMY
Anesthesia: Choice | Wound class: Clean Contaminated

## 2018-01-10 MED ORDER — FAMOTIDINE 20 MG PO TABS
20.0000 mg | ORAL_TABLET | Freq: Once | ORAL | Status: AC
  Administered 2018-01-10: 20 mg via ORAL

## 2018-01-10 MED ORDER — MIDAZOLAM HCL 2 MG/2ML IJ SOLN
INTRAMUSCULAR | Status: AC
  Filled 2018-01-10: qty 2

## 2018-01-10 MED ORDER — DEXAMETHASONE SODIUM PHOSPHATE 10 MG/ML IJ SOLN
INTRAMUSCULAR | Status: AC
Start: 2018-01-10 — End: ?
  Filled 2018-01-10: qty 1

## 2018-01-10 MED ORDER — LACTATED RINGERS IV SOLN
INTRAVENOUS | Status: DC
  Administered 2018-01-10: 13:00:00 via INTRAVENOUS

## 2018-01-10 MED ORDER — ACETAMINOPHEN 10 MG/ML IV SOLN
INTRAVENOUS | Status: AC
  Filled 2018-01-10: qty 100

## 2018-01-10 MED ORDER — ONDANSETRON HCL 4 MG/2ML IJ SOLN
INTRAMUSCULAR | Status: DC | PRN
  Administered 2018-01-10: 4 mg via INTRAVENOUS

## 2018-01-10 MED ORDER — SUCCINYLCHOLINE CHLORIDE 20 MG/ML IJ SOLN
INTRAMUSCULAR | Status: AC
  Filled 2018-01-10: qty 1

## 2018-01-10 MED ORDER — HYDROCODONE-ACETAMINOPHEN 5-325 MG PO TABS: 1.0000 | ORAL_TABLET | ORAL | 0 refills | Status: AC | PRN

## 2018-01-10 MED ORDER — DEXAMETHASONE SODIUM PHOSPHATE 10 MG/ML IJ SOLN
INTRAMUSCULAR | Status: DC | PRN
  Administered 2018-01-10: 10 mg via INTRAVENOUS

## 2018-01-10 MED ORDER — CEFAZOLIN SODIUM-DEXTROSE 2-4 GM/100ML-% IV SOLN
INTRAVENOUS | Status: AC
  Filled 2018-01-10: qty 100

## 2018-01-10 MED ORDER — LIDOCAINE HCL (CARDIAC) 20 MG/ML IV SOLN
INTRAVENOUS | Status: DC | PRN
  Administered 2018-01-10: 100 mg via INTRAVENOUS

## 2018-01-10 MED ORDER — MIDAZOLAM HCL 2 MG/2ML IJ SOLN
INTRAMUSCULAR | Status: DC | PRN
  Administered 2018-01-10: 2 mg via INTRAVENOUS

## 2018-01-10 MED ORDER — ACETAMINOPHEN 10 MG/ML IV SOLN
INTRAVENOUS | Status: DC | PRN
  Administered 2018-01-10: 1000 mg via INTRAVENOUS

## 2018-01-10 MED ORDER — LIDOCAINE HCL (PF) 2 % IJ SOLN
INTRAMUSCULAR | Status: AC
  Filled 2018-01-10: qty 10

## 2018-01-10 MED ORDER — FENTANYL CITRATE (PF) 100 MCG/2ML IJ SOLN: 25.0000 ug | INTRAMUSCULAR | Status: DC | PRN

## 2018-01-10 MED ORDER — ROCURONIUM BROMIDE 50 MG/5ML IV SOLN
INTRAVENOUS | Status: AC
  Filled 2018-01-10: qty 1

## 2018-01-10 MED ORDER — FENTANYL CITRATE (PF) 100 MCG/2ML IJ SOLN
INTRAMUSCULAR | Status: DC | PRN
  Administered 2018-01-10 (×2): 50 ug via INTRAVENOUS
  Administered 2018-01-10: 100 ug via INTRAVENOUS

## 2018-01-10 MED ORDER — PROPOFOL 10 MG/ML IV BOLUS
INTRAVENOUS | Status: DC | PRN
  Administered 2018-01-10: 20 mg via INTRAVENOUS
  Administered 2018-01-10: 180 mg via INTRAVENOUS

## 2018-01-10 MED ORDER — ONDANSETRON HCL 4 MG/2ML IJ SOLN
INTRAMUSCULAR | Status: AC
  Filled 2018-01-10: qty 2

## 2018-01-10 MED ORDER — FENTANYL CITRATE (PF) 100 MCG/2ML IJ SOLN
INTRAMUSCULAR | Status: AC
  Filled 2018-01-10: qty 4

## 2018-01-10 MED ORDER — FAMOTIDINE 20 MG PO TABS
ORAL_TABLET | ORAL | Status: AC
  Administered 2018-01-10: 20 mg via ORAL
  Filled 2018-01-10: qty 1

## 2018-01-10 MED ORDER — ONDANSETRON HCL 4 MG/2ML IJ SOLN: 4.0000 mg | Freq: Once | INTRAMUSCULAR | Status: DC | PRN

## 2018-01-10 MED ORDER — BUPIVACAINE-EPINEPHRINE (PF) 0.5% -1:200000 IJ SOLN
INTRAMUSCULAR | Status: AC
  Filled 2018-01-10: qty 30

## 2018-01-10 MED ORDER — SEVOFLURANE IN SOLN
RESPIRATORY_TRACT | Status: AC
  Filled 2018-01-10: qty 250

## 2018-01-10 MED ORDER — BUPIVACAINE-EPINEPHRINE (PF) 0.5% -1:200000 IJ SOLN
INTRAMUSCULAR | Status: DC | PRN
  Administered 2018-01-10: 6 mL via PERINEURAL

## 2018-01-10 SURGICAL SUPPLY — 27 items
BLADE SURG 15 STRL LF DISP TIS (BLADE) ×1 IMPLANT
BLADE SURG 15 STRL SS (BLADE) ×1
CANISTER SUCT 1200ML W/VALVE (MISCELLANEOUS) ×2 IMPLANT
DRAPE LAPAROTOMY 100X77 ABD (DRAPES) ×2 IMPLANT
DRAPE LEGGINS SURG 28X43 STRL (DRAPES) ×2 IMPLANT
DRSG TELFA 3X8 NADH (GAUZE/BANDAGES/DRESSINGS) ×2 IMPLANT
ELECT REM PT RETURN 9FT ADLT (ELECTROSURGICAL) ×2
ELECTRODE REM PT RTRN 9FT ADLT (ELECTROSURGICAL) ×1 IMPLANT
GAUZE SPONGE 4X4 12PLY STRL (GAUZE/BANDAGES/DRESSINGS) ×2 IMPLANT
GLOVE BIO SURGEON STRL SZ7.5 (GLOVE) ×2 IMPLANT
GOWN STRL REUS W/ TWL LRG LVL3 (GOWN DISPOSABLE) ×2 IMPLANT
GOWN STRL REUS W/TWL LRG LVL3 (GOWN DISPOSABLE) ×2
HEMOSTAT SURGICEL 2X14 (HEMOSTASIS) ×2 IMPLANT
KIT TURNOVER CYSTO (KITS) ×2 IMPLANT
LABEL OR SOLS (LABEL) ×2 IMPLANT
NEEDLE HYPO 25X1 1.5 SAFETY (NEEDLE) ×2 IMPLANT
NS IRRIG 500ML POUR BTL (IV SOLUTION) ×2 IMPLANT
PACK BASIN MINOR ARMC (MISCELLANEOUS) ×2 IMPLANT
PAD PREP 24X41 OB/GYN DISP (PERSONAL CARE ITEMS) ×2 IMPLANT
SOL PREP PVP 2OZ (MISCELLANEOUS) ×2
SOLUTION PREP PVP 2OZ (MISCELLANEOUS) ×1 IMPLANT
SURGILUBE 2OZ TUBE FLIPTOP (MISCELLANEOUS) ×2 IMPLANT
SUT CHROMIC 3 0 SH 27 (SUTURE) ×2 IMPLANT
SUT CHROMIC 5 0 RB 1 27 (SUTURE) ×4 IMPLANT
SUT VIC AB 3-0 SH 27 (SUTURE) ×1
SUT VIC AB 3-0 SH 27X BRD (SUTURE) ×1 IMPLANT
SYR 10ML LL (SYRINGE) ×2 IMPLANT

## 2018-01-10 NOTE — Anesthesia Preprocedure Evaluation (Addendum)
Anesthesia Evaluation  Patient identified by MRN, date of birth, ID band Patient awake    Reviewed: Allergy & Precautions, NPO status , Patient's Chart, lab work & pertinent test results  History of Anesthesia Complications Negative for: history of anesthetic complications  Airway Mallampati: II       Dental   Pulmonary neg sleep apnea, neg COPD, former smoker,           Cardiovascular (-) hypertension(-) Past MI and (-) CHF (-) dysrhythmias (-) Valvular Problems/Murmurs     Neuro/Psych neg Seizures    GI/Hepatic Neg liver ROS, neg GERD  ,  Endo/Other  neg diabetes  Renal/GU negative Renal ROS     Musculoskeletal   Abdominal   Peds  Hematology   Anesthesia Other Findings   Reproductive/Obstetrics                            Anesthesia Physical Anesthesia Plan  ASA: II  Anesthesia Plan: General   Post-op Pain Management:    Induction: Intravenous  PONV Risk Score and Plan:   Airway Management Planned: LMA  Additional Equipment:   Intra-op Plan:   Post-operative Plan:   Informed Consent: I have reviewed the patients History and Physical, chart, labs and discussed the procedure including the risks, benefits and alternatives for the proposed anesthesia with the patient or authorized representative who has indicated his/her understanding and acceptance.     Plan Discussed with:   Anesthesia Plan Comments:         Anesthesia Quick Evaluation

## 2018-01-10 NOTE — Anesthesia Postprocedure Evaluation (Signed)
Anesthesia Post Note  Patient: William Mathews  Procedure(s) Performed: FISTULOTOMY (N/A )  Patient location during evaluation: PACU Anesthesia Type: General Level of consciousness: awake and alert Pain management: pain level controlled Vital Signs Assessment: post-procedure vital signs reviewed and stable Respiratory status: spontaneous breathing and respiratory function stable Cardiovascular status: stable Anesthetic complications: no     Last Vitals:  Vitals:   01/10/18 1505 01/10/18 1515  BP: 110/71   Pulse: 74 84  Resp: 12 13  Temp: 36.8 C   SpO2: 98% 99%    Last Pain:  Vitals:   01/10/18 1505  TempSrc:   PainSc: Asleep                 [name],WILLIAM K

## 2018-01-10 NOTE — OR Nursing (Signed)
Discharge instructions discussed with pt and daughter. Both voice understanding. Dr Hazle Quantintron-Diaz in to see pt.

## 2018-01-10 NOTE — Anesthesia Post-op Follow-up Note (Signed)
Anesthesia QCDR form completed.        

## 2018-01-10 NOTE — Discharge Instructions (Addendum)
Diet: Resume home heart healthy regular diet.   Activity: No heavy lifting > 20 pounds (children, pets, laundry, garbage) or strenuous activity until follow-up, but light activity and walking are encouraged. Do not drive or drink alcohol if taking narcotic pain medications.  Wound care: Remove dressing today before having a bowel movement. Once dressing removed, may shower with soapy water and pat dry (do not rub incisions).   Medications: Resume all home medications. For mild to moderate pain: acetaminophen (Tylenol) or ibuprofen (if no kidney disease). Combining Tylenol with alcohol can substantially increase your risk of causing liver disease. Narcotic pain medications, if prescribed, can be used for severe pain, though may cause nausea, constipation, and drowsiness. Do not combine Tylenol and Percocet within a 6 hour period as Percocet contains Tylenol. If you do not need the narcotic pain medication, you do not need to fill the prescription.  Call office [phone]) at any time if any questions, worsening pain, fevers/chills, bleeding, drainage from incision site, or other concerns.    AMBULATORY SURGERY  DISCHARGE INSTRUCTIONS   1) The drugs that you were given will stay in your system until tomorrow so for the next 24 hours you should not:  A) Drive an automobile B) Make any legal decisions C) Drink any alcoholic beverage   2) You may resume regular meals tomorrow.  Today it is better to start with liquids and gradually work up to solid foods.  You may eat anything you prefer, but it is better to start with liquids, then soup and crackers, and gradually work up to solid foods.   3) Please notify your doctor immediately if you have any unusual bleeding, trouble breathing, redness and pain at the surgery site, drainage, fever, or pain not relieved by medication.    4) Additional Instructions:        Please contact your physician with any problems or Same Day Surgery  at [phone], Monday through Friday 6 am to 4 pm, or [place] at [facility] number at [phone].        CIRUGIA AMBULATORIA       Instruccionnes de alta    01/10/18   1.  Las drogas que se Dispensing optician en su cuerpo [employer], asi      que por las proximas 24 horas usted no debe:   Conducir Field seismologist) un automovil   Hacer ninguna decision legal   Tomar ninguna bebida alcoholica  2.  A) Manana puede comenzar una dieta regular.  Es mejor que hoy empiece con                    liquidos y gradualmente anada [address]       B) Puede comer cualquier comida que desee pero es mejor empezar con liquidos,               luego sopitas con galletas saladas y gradualmente llegar a las comidas solidas.  3.  Por favor avise a su medico inmediatamente si usted tiene algun sangrado anormal,       tiene dificultad con la respiracion, enrojecimiento y Engineer, mining en el sitio de la cirugia,     [place], fiebro o dolor que se alivia con [place].  4.  A) Su visita posoperatoria (despues de su operacion) es con el  Dr.   Date              Time         B)  Por favor llame  para hacer la cita posoperatoria.  5.  Istrucciones especificas :

## 2018-01-10 NOTE — Op Note (Signed)
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 Hazle Quantintron Diaz  Assistant Surgeon: Dr. Katrinka BlazingSmith  Anesthesia: General (LMA)  Wound Classification: Contaminated  Indications: Patient is a 57 y.o. 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    Findings:  1. Posterior anus fistula opening.  2. Short distance fistula going not going through the sphincter.  3. Adequate hemostasis 4. Grade 1 internal hemorrhoid identified. No bleeding  Description of procedure: The patient was brought to the operating room and general anesthesia was induced. The patient was then positioned in the lithotomy position. A time-out was completed verifying correct patient, procedure, site, positioning, and implant(s) and/or special equipment prior to beginning this procedure. The perineum was prepped and draped in the usual sterile fashion. An anoscopy was performed evaluating all quadrants of the anal canal. Grade 1 hemorrhoids were identified. No masses, ulcers or lesions identified. The external opening of the fistula was identified. Local anesthetic was injected as a perianal block. The anus was gently dilated and a Hill-Ferguson retractor was inserted, exposing the anal crypts. The tract was cannulated from the external opening and the flexible probe passed through to the internal opening with care and without resistance. Electrocautery was used to divide the overlying soft tissues. This was accomplished in stages. A portion of the margin was resected as a biopsy and sent to pathology.   Hemostasis was achieved using electrocautery. A gauze pad was tucked between the buttocks.   Specimen: Perianal fistula tract  Complications: None  Estimated Blood Loss: 5mL

## 2018-01-10 NOTE — Interval H&P Note (Signed)
History and Physical Interval Note:  01/10/2018 1:27 PM  Agustina CaroliJose R Dunigan  has presented today for surgery, with the diagnosis of fistula  The various methods of treatment have been discussed with the patient and family. After consideration of risks, benefits and other options for treatment, the patient has consented to  Procedure(s): FISTULOTOMY (N/A) as a surgical intervention .  The patient's history has been reviewed, patient examined, no change in status, stable for surgery.  I have reviewed the patient's chart and labs.  Questions were answered to the patient's satisfaction.     Carolan ShiverEdgardo Cintron-Diaz

## 2018-01-10 NOTE — Transfer of Care (Signed)
Immediate Anesthesia Transfer of Care Note  Patient: William Mathews  Procedure(s) Performed: FISTULOTOMY (N/A )  Patient Location: PACU  Anesthesia Type:General  Level of Consciousness: sedated  Airway & Oxygen Therapy: Patient Spontanous Breathing and Patient connected to face mask oxygen  Post-op Assessment: Report given to RN and Post -op Vital signs reviewed and stable  Post vital signs: Reviewed and stable  Last Vitals:  Vitals:   01/10/18 1304  BP: (!) 144/98  Pulse: 92  Resp: 18  Temp: 36.8 C  SpO2: 100%    Last Pain:  Vitals:   01/10/18 1304  TempSrc: Temporal         Complications: No apparent anesthesia complications

## 2018-01-10 NOTE — Anesthesia Procedure Notes (Signed)
Procedure Name: LMA Insertion Date/Time: 01/10/2018 2:27 PM Performed by: Junious SilkNoles, Blen Ransome, CRNA Pre-anesthesia Checklist: Patient identified, Patient being monitored, Timeout performed, Emergency Drugs available and Suction available Patient Re-evaluated:Patient Re-evaluated prior to induction Oxygen Delivery Method: Circle system utilized Preoxygenation: Pre-oxygenation with 100% oxygen Induction Type: IV induction Ventilation: Mask ventilation without difficulty LMA: LMA inserted LMA Size: 4.0 Tube type: Oral Number of attempts: 1 Placement Confirmation: positive ETCO2 and breath sounds checked- equal and bilateral Tube secured with: Tape Dental Injury: Teeth and Oropharynx as per pre-operative assessment

## 2018-01-11 ENCOUNTER — Encounter: Payer: Self-pay | Admitting: General Surgery

## 2018-01-12 LAB — SURGICAL PATHOLOGY

## 2023-12-15 ENCOUNTER — Emergency Department
Admission: EM | Admit: 2023-12-15 | Discharge: 2023-12-15 | Disposition: A | Discharge status: Home / Self Care | Payer: 59 | Attending: Emergency Medicine | Admitting: Emergency Medicine

## 2023-12-15 ENCOUNTER — Emergency Department: Payer: 59

## 2023-12-15 ENCOUNTER — Other Ambulatory Visit: Payer: Self-pay

## 2023-12-15 DIAGNOSIS — R1030 Lower abdominal pain, unspecified: Secondary | ICD-10-CM

## 2023-12-15 DIAGNOSIS — R109 Unspecified abdominal pain: Secondary | ICD-10-CM | POA: Diagnosis present

## 2023-12-15 DIAGNOSIS — K603 Anal fistula, unspecified: Secondary | ICD-10-CM | POA: Insufficient documentation

## 2023-12-15 LAB — COMPREHENSIVE METABOLIC PANEL
ALT: 17 U/L (ref 0–44)
AST: 19 U/L (ref 15–41)
Albumin: 4 g/dL (ref 3.5–5.0)
Alkaline Phosphatase: 80 U/L (ref 38–126)
Anion gap: 9 (ref 5–15)
BUN: 16 mg/dL (ref 8–23)
CO2: 23 mmol/L (ref 22–32)
Calcium: 8.5 mg/dL — ABNORMAL LOW (ref 8.9–10.3)
Chloride: 106 mmol/L (ref 98–111)
Creatinine, Ser: 1.42 mg/dL — ABNORMAL HIGH (ref 0.61–1.24)
GFR, Estimated: 55 mL/min — ABNORMAL LOW (ref >60)
Glucose, Bld: 103 mg/dL — ABNORMAL HIGH (ref 70–99)
Potassium: 4.3 mmol/L (ref 3.5–5.1)
Sodium: 138 mmol/L (ref 135–145)
Total Bilirubin: 0.8 mg/dL (ref 0.0–1.2)
Total Protein: 6.5 g/dL (ref 6.5–8.1)

## 2023-12-15 LAB — CBC
HCT: 42.1 % (ref 39.0–52.0)
Hemoglobin: 13.9 g/dL (ref 13.0–17.0)
MCH: 29.3 pg (ref 26.0–34.0)
MCHC: 33 g/dL (ref 30.0–36.0)
MCV: 88.8 fL (ref 80.0–100.0)
Platelets: 290 10*3/uL (ref 150–400)
RBC: 4.74 MIL/uL (ref 4.22–5.81)
RDW: 13.2 % (ref 11.5–15.5)
WBC: 7.4 10*3/uL (ref 4.0–10.5)
nRBC: 0 % (ref 0.0–0.2)

## 2023-12-15 LAB — URINALYSIS, ROUTINE W REFLEX MICROSCOPIC
Bilirubin Urine: NEGATIVE
Glucose, UA: NEGATIVE mg/dL
Hgb urine dipstick: NEGATIVE
Ketones, ur: 5 mg/dL — AB
Leukocytes,Ua: NEGATIVE
Nitrite: NEGATIVE
Protein, ur: NEGATIVE mg/dL
Specific Gravity, Urine: 1.011 (ref 1.005–1.030)
pH: 5 (ref 5.0–8.0)

## 2023-12-15 LAB — LIPASE, BLOOD: Lipase: 32 U/L (ref 11–51)

## 2023-12-15 MED ORDER — IOHEXOL 300 MG/ML  SOLN
80.0000 mL | Freq: Once | INTRAMUSCULAR | Status: AC | PRN
  Administered 2023-12-15: 80 mL via INTRAVENOUS

## 2023-12-15 NOTE — ED Triage Notes (Signed)
Pt sts that he has been having nausea and bloating after he eats. Pt sts that little knot pop up on his abd after eating also. Pt last BM was this morning and pt sts that it was normal.

## 2023-12-15 NOTE — ED Provider Notes (Signed)
[facility] Provider Note    Event Date/Time   First MD Initiated Contact with Patient 12/15/23 1254     (approximate)   History   Abdominal Pain   HPI  William Mathews is a 63 y.o. malel with no significant past medical history presents emergency department with complaints of abdominal pain.  Patient states that when he eats he feels bloated and then gets a small knot in the left upper quadrant.  Also having some lower abdominal pain.  No diarrhea.      Physical Exam   Triage Vital Signs: ED Triage Vitals  Encounter Vitals Group     BP 12/15/23 1123 131/76     Systolic BP Percentile --      Diastolic BP Percentile --      Pulse Rate 12/15/23 1123 83     Resp 12/15/23 1123 17     Temp 12/15/23 1123 97.9 F (36.6 C)     Temp Source 12/15/23 1123 Oral     SpO2 12/15/23 1123 99 %     Weight 12/15/23 1124 175 lb (79.4 kg)     Height 12/15/23 1124 5\' 4"  (1.626 m)     Head Circumference --      Peak Flow --      Pain Score 12/15/23 1124 4     Pain Loc --      Pain Education --      Exclude from Growth Chart --     Most recent vital signs: Vitals:   12/15/23 1123 12/15/23 1401  BP: 131/76 122/81  Pulse: 83 84  Resp: 17 18  Temp: 97.9 F (36.6 C)   SpO2: 99% 98%     General: Awake, no distress.   CV:  Good peripheral perfusion. regular rate and  rhythm Resp:  Normal effort. Lungs cta Abd:  No distention.   Other:      ED Results / Procedures / Treatments   Labs (all labs ordered are listed, but only abnormal results are displayed) Labs Reviewed  COMPREHENSIVE METABOLIC PANEL - Abnormal; Notable for the following components:      Result Value   Glucose, Bld 103 (*)    Creatinine, Ser 1.42 (*)    Calcium 8.5 (*)    GFR, Estimated 55 (*)    All other components within normal limits  URINALYSIS, ROUTINE W REFLEX MICROSCOPIC - Abnormal; Notable for the following components:   Color, Urine YELLOW (*)    APPearance CLEAR (*)     Ketones, ur 5 (*)    All other components within normal limits  LIPASE, BLOOD  CBC     EKG     RADIOLOGY CT abdomen pelvis IV contrast    PROCEDURES:   Procedures   MEDICATIONS ORDERED IN ED: Medications  iohexol (OMNIPAQUE) 300 MG/ML solution 80 mL (80 mLs Intravenous Contrast Given 12/15/23 1416)     IMPRESSION / MDM / ASSESSMENT AND PLAN / ED COURSE  I reviewed the triage vital signs and the nursing notes.                              Differential diagnosis includes, but is not limited to, SBO, hernia, acute cholecystitis, pancreatitis, diverticulosis, diverticulitis  Patient's presentation is most consistent with acute illness / injury with system symptoms.    Patient's labs are reassuring CT abdomen pelvis IV contrast   Care transferred to Cruz Condon, PA-C  FINAL CLINICAL IMPRESSION(S) / ED DIAGNOSES   Final diagnoses:  Abdominal pain, unspecified abdominal location     Rx / DC Orders   ED Discharge Orders     None        Note:  This document was prepared using Dragon voice recognition software and may include unintentional dictation errors.    Faythe Ghee, PA-C 12/15/23 1516    Jene Every, MD 12/15/23 [phone]

## 2023-12-15 NOTE — Consult Note (Signed)
Hematology/Oncology Consult note [facility] Telephone:([phone] [phone]  Patient Care Team: Pcp, No as PCP - General   Name of the patient: William Mathews  [id number]  09/20/1961    Reason for consult: Mesenteric mass   Requesting physician/provider: Cruz Condon, PA  Date of visit: 12/15/2023    History of presenting illness- Patient is a 63 year old Hispanic male with no significant past medical history presented to the ER with symptoms of abdominal pain especially after eating and feeling of abdominal distention.  He had a CT abdomen and pelvis with contrast which showed large soft tissue mass along the mesentery measuring up to 21 cm with encasement of central mesenteric vessels and loss of tissue planes between the mass and several bowel loops.  Caliber change along the third portion of duodenum with partial mild obstruction.  Findings concerning for lymphoma.  Serum creatinine mildly elevated at 1.4.  Spleen slightly enlarged without intrinsic splenic mass.  Patient denies any significant nausea or vomiting.  His last bowel movement was today.  He reports early satiety.  Denies any significant weight loss  ECOG PS- 0  Pain scale- 0   Review of systems- Review of Systems  Constitutional:  Negative for chills, fever, malaise/fatigue and weight loss.  HENT:  Negative for congestion, ear discharge and nosebleeds.   Eyes:  Negative for blurred vision.  Respiratory:  Negative for cough, hemoptysis, sputum production, shortness of breath and wheezing.   Cardiovascular:  Negative for chest pain, palpitations, orthopnea and claudication.  Gastrointestinal:  Positive for abdominal pain. Negative for blood in stool, constipation, diarrhea, heartburn, melena, nausea and vomiting.       Early satiety  Genitourinary:  Negative for dysuria, flank pain, frequency, hematuria and urgency.  Musculoskeletal:  Negative for back pain, joint pain and myalgias.   Skin:  Negative for rash.  Neurological:  Negative for dizziness, tingling, focal weakness, seizures, weakness and headaches.  Endo/Heme/Allergies:  Does not bruise/bleed easily.  Psychiatric/Behavioral:  Negative for depression and suicidal ideas. The patient does not have insomnia.     No Known Allergies  There are no active problems to display for this patient.    No past medical history on file.   Past Surgical History:  Procedure Laterality Date   FISTULOTOMY N/A 01/10/2018   Procedure: FISTULOTOMY;  Surgeon: Carolan Shiver, MD;  Location: [facility] ORS;  Service: General;  Laterality: N/A;    Social History   Socioeconomic History   Marital status: Married    Spouse name: Not on file   Number of children: Not on file   Years of education: Not on file   Highest education level: Not on file  Occupational History   Not on file  Tobacco Use   Smoking status: Former   Smokeless tobacco: Never  Vaping Use   Vaping status: Never Used  Substance and Sexual Activity   Alcohol use: Yes    Alcohol/week: 12.0 standard drinks of alcohol    Types: 12 Cans of beer per week    Comment: week   Drug use: Yes    Types: Cocaine    Comment: years ago    Sexual activity: Not on file  Other Topics Concern   Not on file  Social History Narrative   Not on file   Social Drivers of Health   Financial Resource Strain: Not on file  Food Insecurity: Not on file  Transportation Needs: Not on file  Physical Activity: Not on file  Stress:  Not on file  Social Connections: Not on file  Intimate Partner Violence: Not on file     No family history on file.  No current facility-administered medications for this encounter.  Current Outpatient Medications:    cyclobenzaprine (FLEXERIL) 10 MG tablet, Take 1 tablet (10 mg total) by mouth 3 (three) times daily as needed. (Patient not taking: Reported on 01/10/2018), Disp: 15 tablet, Rfl: 0   hydrocortisone (ANUSOL-HC) 25 MG  suppository, Place 25 mg rectally 2 (two) times daily., Disp: , Rfl:    ibuprofen (ADVIL,MOTRIN) 600 MG tablet, Take 1 tablet (600 mg total) by mouth every 8 (eight) hours as needed., Disp: 15 tablet, Rfl: 0   traMADol (ULTRAM) 50 MG tablet, Take 1 tablet (50 mg total) by mouth every 6 (six) hours as needed for moderate pain. (Patient not taking: Reported on 01/10/2018), Disp: 12 tablet, Rfl: 0   Physical exam:  Vitals:   12/15/23 1123 12/15/23 1124 12/15/23 1401  BP: 131/76  122/81  Pulse: 83  84  Resp: 17  18  Temp: 97.9 F (36.6 C)    TempSrc: Oral    SpO2: 99%  98%  Weight:  175 lb (79.4 kg)   Height:  5\' 4"  (1.626 m)    Physical Exam Cardiovascular:     Rate and Rhythm: Normal rate and regular rhythm.     Heart sounds: Normal heart sounds.  Pulmonary:     Effort: Pulmonary effort is normal.     Breath sounds: Normal breath sounds.  Abdominal:     Palpations: Abdomen is soft.     Comments: There is a palpable mass in the left upper quadrant and epigastrium that is ill-defined  Musculoskeletal:     Cervical back: Normal range of motion.  Lymphadenopathy:     Comments: No palpable cervical, supraclavicular, axillary or inguinal adenopathy    Skin:    General: Skin is warm and dry.  Neurological:     Mental Status: He is alert and oriented to person, place, and time.           Latest Ref Rng & Units 12/15/2023   11:25 AM  CMP  Glucose 70 - 99 mg/dL 161   BUN 8 - 23 mg/dL 16   Creatinine 0.96 - 1.24 mg/dL 0.45   Sodium 409 - 811 mmol/L 138   Potassium 3.5 - 5.1 mmol/L 4.3   Chloride 98 - 111 mmol/L 106   CO2 22 - 32 mmol/L 23   Calcium 8.9 - 10.3 mg/dL 8.5   Total Protein 6.5 - 8.1 g/dL 6.5   Total Bilirubin 0.0 - 1.2 mg/dL 0.8   Alkaline Phos 38 - 126 U/L 80   AST 15 - 41 U/L 19   ALT 0 - 44 U/L 17       Latest Ref Rng & Units 12/15/2023   11:25 AM  CBC  WBC 4.0 - 10.5 K/uL 7.4   Hemoglobin 13.0 - 17.0 g/dL 91.4   Hematocrit 78.2 - 52.0 % 42.1    Platelets 150 - 400 K/uL 290     @IMAGES @  CT ABDOMEN PELVIS W CONTRAST Result Date: 12/15/2023 CLINICAL DATA:  Nausea and bloating. Mass lesion. * Tracking Code: BO * EXAM: CT ABDOMEN AND PELVIS WITH CONTRAST TECHNIQUE: Multidetector CT imaging of the abdomen and pelvis was performed using the standard protocol following bolus administration of intravenous contrast. RADIATION DOSE REDUCTION: This exam was performed according to the departmental dose-optimization program which includes automated exposure control, adjustment  of the mA and/or kV according to patient size and/or use of iterative reconstruction technique. CONTRAST:  80mL OMNIPAQUE IOHEXOL 300 MG/ML  SOLN COMPARISON:  CT 2008 FINDINGS: Lower chest: Breathing motion lung bases. There is some linear opacity at the bases likely scar or atelectasis. Mild ground-glass. No pleural effusion. Hepatobiliary: Gallbladder is mildly distended. No intrahepatic biliary ductal dilatation. Pancreas: No intrinsic pancreatic mass Spleen: Spleen is enlarged with a cephalocaudal length of 14.8 cm. Preserved enhancement. Adrenals/Urinary Tract: Adrenal glands are preserved. No enhancing renal mass. There is mild right-sided renal collecting system dilatation. The course of the ureter in the mid retroperitoneum becomes encased by retroperitoneal mass and may be contributing to the collecting system dilatation. No left-sided renal collecting system dilatation. The left ureter has a normal course and caliber down to the bladder. Preserved contours of the underdistended urinary bladder. Stomach/Bowel: Large bowel has a normal course and caliber scattered colonic stool. Left-sided colonic diverticula. Stomach is distended with fluid. The proximal duodenal is distended with fluid. The third portion is encased by soft tissue mass. The small bowel more distally is nondilated. There are several areas of small bowel which are poorly defined with the extensive mesenteric mass.  Vascular/Lymphatic: Normal caliber aorta with scattered vascular calcifications along the aorta and branch vessels. The IVC is displaced anterior by a retroperitoneal mass and is somewhat narrowed and encased. Lumen is patent. There is also encasement of the renal vasculature, mesenteric vasculature. There is large confluence central mesenteric mass again encasing vessels. In the axial plane on series 3, image 51 for example of the mesenteric lesion measures 21.8 x 8.9 cm. Cephalocaudal extent of the lesion which is confluence approaches 20.7 cm. There is also significant components retroperitoneal as stated above. The mass lesion for example encasing the retroperitoneal vessels and structures on image 36 of series 3 would measured 12.2 x 7.3 cm. Separate retrocrural lesion on the right measuring 2.9 by 1.6 cm. Reproductive: Prostate is unremarkable. Other: Mild ascites. Scattered mesenteric haziness. Few varices identified in the mesentery Musculoskeletal: Scattered degenerative changes of the spine and pelvis. Critical Value/emergent results were called by telephone at the time of interpretation on 12/15/2023 at 15:30 pm to provider PA Cruz Condon, who verbally acknowledged these results. IMPRESSION: Large soft tissue mass along the mesentery measuring up to 21 cm. There is encasement of central mesenteric vessels as well as loss of tissue planes between mass and several bowel loops. There is also a caliber change noted along the bowel involving the third portion of the duodenum. A very mild partial obstruction would be in the differential associated with this mass. Please correlate with any specific symptoms of slow gastric emptying. There are also components of mass along the retroperitoneum with encasement of retroperitoneal structures and narrowing of vasculature with some varices. Overall appearance would be worrisome for malignancy such as lymphoma. The spleen is slightly enlarged without intrinsic  splenic mass. Mild ascites. Colonic diverticula. Electronically Signed   By: Karen Kays M.D.   On: 12/15/2023 15:38    Assessment and plan- Patient is a 63 y.o. male presenting with abdominal pain and early satiety found to have a large 20 cm mesenteric mass  I have reviewed CT abdomen and pelvis images independently and discussed findings with the patient which shows a central mesenteric mass measuring 21.8 x 8.9 cm with involvement of bowel loops causing very mild partial obstruction of the duodenum.  Clinically patient is not overtly in bowel obstruction.  He is able to  tolerate oral intake without significant nausea or vomiting.  He is moving his bowels.  He does report early satiety.  Findings and radiology was concerning for lymphoma although just also remains in the differential.  We discussed admission and inpatient workup with a biopsy on Monday versus outpatient workup.  Given that patient is not overtly in bowel obstruction at this time it would be okay for the patient to be discharged from oncology standpoint and I will plan to get a CT-guided biopsy of the mesenteric mass.  I already discussed this case with Dr. Archer Asa from IR and I hope to get the biopsy early next week.  [facility] will be calling him on Monday with those details.  I will also consider getting a PET scan as an outpatient.  Patient comprehends my plan well   Thank you for this kind referral and the opportunity to participate in the care of this patient   Visit Diagnosis 1. Abdominal pain, unspecified abdominal location   2. Mesenteric mass  Dr. Owens Shark, MD, MPH [facility] [id number] 12/15/2023

## 2023-12-15 NOTE — ED Provider Notes (Addendum)
Shared visit   CT scan concerning for malignancy.  Dr. Smith Robert evaluated the patient in the emergency department.  Shared decision making and offered admission for biopsy patient wants to follow-up as an outpatient.  Pain well-controlled.  Will follow-up as an outpatient with oncology.  Given return precautions.   Corena Herter, MD 12/15/23 1610    Corena Herter, MD 12/15/23 2340

## 2023-12-15 NOTE — ED Provider Notes (Addendum)
----------------------------------------- 4:43 PM on 12/15/2023 -----------------------------------------  Blood pressure 122/81, pulse 84, temperature 97.9 F (36.6 C), temperature source Oral, resp. rate 18, height 5\' 4"  (1.626 m), weight 79.4 kg, SpO2 98%.  Assuming care from Greig Right, PA-C.  In short, William Mathews is a 63 y.o. male with a chief complaint of Abdominal Pain .  Refer to the original H&P for additional details.  The current plan of care is to wait for CT results, discharge if negative.  ____________________________________________    ED Results / Procedures / Treatments   Labs (all labs ordered are listed, but only abnormal results are displayed) Labs Reviewed  COMPREHENSIVE METABOLIC PANEL - Abnormal; Notable for the following components:      Result Value   Glucose, Bld 103 (*)    Creatinine, Ser 1.42 (*)    Calcium 8.5 (*)    GFR, Estimated 55 (*)    All other components within normal limits  URINALYSIS, ROUTINE W REFLEX MICROSCOPIC - Abnormal; Notable for the following components:   Color, Urine YELLOW (*)    APPearance CLEAR (*)    Ketones, ur 5 (*)    All other components within normal limits  LIPASE, BLOOD  CBC    RADIOLOGY  I personally viewed and evaluated these images as part of my medical decision making, as well as reviewing the written report by the radiologist.  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  CT ABDOMEN PELVIS W CONTRAST Result Date: 12/15/2023 CLINICAL DATA:  Nausea and bloating. Mass lesion. * Tracking Code: BO * EXAM: CT ABDOMEN AND PELVIS WITH CONTRAST TECHNIQUE: Multidetector CT imaging of the abdomen and pelvis was performed using the standard protocol following bolus administration of intravenous contrast. RADIATION DOSE REDUCTION: This exam was performed according to the departmental dose-optimization program which includes automated  exposure control, adjustment of the mA and/or kV according to patient size and/or use of iterative reconstruction technique. CONTRAST:  80mL OMNIPAQUE IOHEXOL 300 MG/ML  SOLN COMPARISON:  CT 2008 FINDINGS: Lower chest: Breathing motion lung bases. There is some linear opacity at the bases likely scar or atelectasis. Mild ground-glass. No pleural effusion. Hepatobiliary: Gallbladder is mildly distended. No intrahepatic biliary ductal dilatation. Pancreas: No intrinsic pancreatic mass Spleen: Spleen is enlarged with a cephalocaudal length of 14.8 cm. Preserved enhancement. Adrenals/Urinary Tract: Adrenal glands are preserved. No enhancing renal mass. There is mild right-sided renal collecting system dilatation. The course of the ureter in the mid retroperitoneum becomes encased by retroperitoneal mass and may be contributing to the collecting system dilatation. No left-sided renal collecting system dilatation. The left ureter has a normal course and caliber down to the bladder. Preserved contours of the underdistended urinary bladder. Stomach/Bowel: Large bowel has a normal course and caliber scattered colonic stool. Left-sided colonic diverticula. Stomach is distended with fluid. The proximal duodenal is distended with fluid. The third portion is encased by soft tissue mass. The small bowel more distally is nondilated. There are several areas of small bowel which are poorly defined with the extensive mesenteric mass. Vascular/Lymphatic: Normal caliber aorta with scattered vascular calcifications along the aorta and branch vessels. The IVC is displaced anterior by a retroperitoneal mass and is somewhat narrowed and encased. Lumen is patent. There is also encasement of the renal vasculature, mesenteric vasculature. There is large confluence central mesenteric mass again encasing vessels. In the axial plane on series 3, image 51 for example of the mesenteric  lesion measures 21.8 x 8.9 cm. Cephalocaudal extent of the  lesion which is confluence approaches 20.7 cm. There is also significant components retroperitoneal as stated above. The mass lesion for example encasing the retroperitoneal vessels and structures on image 36 of series 3 would measured 12.2 x 7.3 cm. Separate retrocrural lesion on the right measuring 2.9 by 1.6 cm. Reproductive: Prostate is unremarkable. Other: Mild ascites. Scattered mesenteric haziness. Few varices identified in the mesentery Musculoskeletal: Scattered degenerative changes of the spine and pelvis. Critical Value/emergent results were called by telephone at the time of interpretation on 12/15/2023 at 15:30 pm to provider PA Cruz Condon, who verbally acknowledged these results. IMPRESSION: Large soft tissue mass along the mesentery measuring up to 21 cm. There is encasement of central mesenteric vessels as well as loss of tissue planes between mass and several bowel loops. There is also a caliber change noted along the bowel involving the third portion of the duodenum. A very mild partial obstruction would be in the differential associated with this mass. Please correlate with any specific symptoms of slow gastric emptying. There are also components of mass along the retroperitoneum with encasement of retroperitoneal structures and narrowing of vasculature with some varices. Overall appearance would be worrisome for malignancy such as lymphoma. The spleen is slightly enlarged without intrinsic splenic mass. Mild ascites. Colonic diverticula. Electronically Signed   By: Karen Kays M.D.   On: 12/15/2023 15:38     PROCEDURES:  Critical Care performed: No  Procedures   MEDICATIONS ORDERED IN ED: Medications  iohexol (OMNIPAQUE) 300 MG/ML solution 80 mL (80 mLs Intravenous Contrast Given 12/15/23 1416)     IMPRESSION / MDM / ASSESSMENT AND PLAN / ED COURSE  I reviewed the triage vital signs and the nursing notes.                             63 year old male presents for  evaluation of nausea and bloating after eating for about a month.  Vital signs are stable patient NAD on exam.  Differential diagnosis includes, but is not limited to, acute appendicitis, renal colic, testicular torsion, urinary tract infection/pyelonephritis, prostatitis,  epididymitis, diverticulitis, small bowel obstruction or ileus, colitis, abdominal aortic aneurysm, gastroenteritis, hernia, etc.  Patient's presentation is most consistent with acute complicated illness / injury requiring diagnostic workup.  Labs unremarkable aside from mild elevated creatinine.  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  I spoke with the on-call oncologist, Dr. Smith Robert, who recommended patient be admitted today for biopsy, however due to it being Friday, IR is unavailable at this time to perform the procedure. Patient would prefer to be discharged home with close outpatient follow up with Dr. Smith Robert. She will schedule a biopsy for early next week. Patient and care team were all in agreement with plan. Patient was discharged in stable condition.     FINAL CLINICAL IMPRESSION(S) / ED DIAGNOSES   Final diagnoses:  Abdominal pain, unspecified abdominal location     Rx / DC Orders   ED Discharge Orders     None        Note:  This document was prepared using Dragon voice recognition software and may include unintentional dictation errors.    Cameron Ali, PA-C 12/15/23 1659    Cameron Ali, PA-C 12/15/23 1751    Corena Herter, MD 12/15/23 2340

## 2023-12-15 NOTE — Discharge Instructions (Addendum)
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 It is very important that you follow up with Dr. Smith Robert, who is a cancer doctor. She will be overseeing your care as an outpatient. I believe you will be scheduled for a biopsy as an outpatient next week. Watch for a phone call from them. I also recommend calling the office on Monday.   It was a pleasure to care for you today and I wish you the best!  Cruz Condon PA-C

## 2023-12-18 ENCOUNTER — Telehealth: Payer: Self-pay | Admitting: *Deleted

## 2023-12-18 ENCOUNTER — Other Ambulatory Visit: Payer: Self-pay | Admitting: *Deleted

## 2023-12-18 DIAGNOSIS — K6389 Other specified diseases of intestine: Secondary | ICD-10-CM

## 2023-12-18 NOTE — Telephone Encounter (Signed)
RN called and spoke with daughter Annabell and gave her appointments for PET Scan on 12/20/23 @ 830am at the medical mall at [facility], biopsy on 12/21/23 arrive at 1030a for 1130a procedure at the heart and vascular dept/entrance at [facility], instructed to be NPO 8 hours prior, needs a driver, and appointment on 12/27/23 1045am with Dr Smith Robert at the [facility]. Daughter verbalized understanding of above.  Instructed to call for any questions or concerns.

## 2023-12-19 ENCOUNTER — Other Ambulatory Visit: Payer: Self-pay | Admitting: Radiology

## 2023-12-19 DIAGNOSIS — K6389 Other specified diseases of intestine: Secondary | ICD-10-CM

## 2023-12-20 ENCOUNTER — Other Ambulatory Visit: Payer: Self-pay | Admitting: Radiology

## 2023-12-20 ENCOUNTER — Telehealth: Payer: Self-pay

## 2023-12-20 ENCOUNTER — Ambulatory Visit
Admission: RE | Admit: 2023-12-20 | Discharge: 2023-12-20 | Disposition: A | Discharge status: Home / Self Care | Payer: 59 | Source: Ambulatory Visit | Attending: Oncology

## 2023-12-20 DIAGNOSIS — R109 Unspecified abdominal pain: Secondary | ICD-10-CM | POA: Insufficient documentation

## 2023-12-20 DIAGNOSIS — R590 Localized enlarged lymph nodes: Secondary | ICD-10-CM | POA: Insufficient documentation

## 2023-12-20 DIAGNOSIS — R1902 Left upper quadrant abdominal swelling, mass and lump: Secondary | ICD-10-CM | POA: Insufficient documentation

## 2023-12-20 DIAGNOSIS — C8513 Unspecified B-cell lymphoma, intra-abdominal lymph nodes: Secondary | ICD-10-CM | POA: Insufficient documentation

## 2023-12-20 DIAGNOSIS — K6389 Other specified diseases of intestine: Secondary | ICD-10-CM

## 2023-12-20 NOTE — Progress Notes (Signed)
Patient for CT guided Mesenteric mass biopsy on Thurs 12/21/2023, I called and spoke with the patient's daughter, Wilhemina Cash on the phone and gave pre-procedure instructions. William Mathews was made aware to be here at 10:30a, NPO after MN prior to procedure as well as driver post procedure/recovery/discharge. William Mathews stated understanding.  Called 12/20/2023  Interpreter requested

## 2023-12-20 NOTE — Progress Notes (Signed)
William Big, MD sent to William Mathews Discussed with Dr. Smith Robert - approved for CT Guided biopsy of massive mesenteric mass.  There appears to be a good window.  HKM

## 2023-12-20 NOTE — H&P (Signed)
Chief Complaint: Patient was seen in consultation today for mesenteric mass   at the request of Rao,Archana C  Referring Physician(s): Creig Hines  Supervising Physician: Irish Lack  Patient Status: [facility] - Out-pt  Patient does not accept blood products. Paperwork on file.   History of Present Illness: William Mathews is a 63 y.o. male without previous significant medical history. Patient presented to ER 12/15/23 with c/o abdominal pain after eating and abdominal distension. CT abdomen/pelvis 12/15/23 reported a large soft tissue mass along the mesentery measuring up to 21 cm. This encased the central mesenteric vessels. There was evidence of caliber change along the bowel involving the third portion of the duodenum. Dr. Pete Glatter. Smith Robert was consulted while the patient was in the hospital. On the initial exam, there was a palpable mass in the left upper quadrant and epigastrium. Patient was discharged home with a outpatient follow up for a biopsy.   Patient reports that for 1 month he has experienced sensations of food getting stuck. Patient also reports abdominal bloating. Patient's daughter is present today. Denies: chest pain, shortness of breath, nausea, vomiting, diarrhea, abdominal pain, fever, and/or cough.  No past medical history on file.  Past Surgical History:  Procedure Laterality Date   FISTULOTOMY N/A 01/10/2018   Procedure: FISTULOTOMY;  Surgeon: Carolan Shiver, MD;  Location: [facility] ORS;  Service: General;  Laterality: N/A;    Allergies: Patient has no known allergies.  Medications: Prior to Admission medications   Medication Sig Start Date End Date Taking? Authorizing Provider  cyclobenzaprine (FLEXERIL) 10 MG tablet Take 1 tablet (10 mg total) by mouth 3 (three) times daily as needed. Patient not taking: Reported on 01/10/2018 10/18/16   Joni Reining, PA-C  hydrocortisone (ANUSOL-HC) 25 MG suppository Place 25 mg rectally 2 (two) times daily.     [provider]  ibuprofen (ADVIL,MOTRIN) 600 MG tablet Take 1 tablet (600 mg total) by mouth every 8 (eight) hours as needed. 10/18/16   Joni Reining, PA-C  traMADol (ULTRAM) 50 MG tablet Take 1 tablet (50 mg total) by mouth every 6 (six) hours as needed for moderate pain. Patient not taking: Reported on 01/10/2018 10/18/16   Joni Reining, PA-C     No family history on file.  Social History   Socioeconomic History   Marital status: Married    Spouse name: Not on file   Number of children: Not on file   Years of education: Not on file   Highest education level: Not on file  Occupational History   Not on file  Tobacco Use   Smoking status: Former   Smokeless tobacco: Never  Vaping Use   Vaping status: Never Used  Substance and Sexual Activity   Alcohol use: Yes    Alcohol/week: 12.0 standard drinks of alcohol    Types: 12 Cans of beer per week    Comment: week   Drug use: Yes    Types: Cocaine    Comment: years ago    Sexual activity: Not on file  Other Topics Concern   Not on file  Social History Narrative   Not on file   Social Drivers of Health   Financial Resource Strain: Not on file  Food Insecurity: Not on file  Transportation Needs: Not on file  Physical Activity: Not on file  Stress: Not on file  Social Connections: Not on file      Review of Systems: A 12 point ROS discussed and pertinent positives are  indicated in the HPI above.  All other systems are negative.  Review of Systems  Respiratory:  Negative for cough and shortness of breath.   Cardiovascular:  Negative for chest pain.  Gastrointestinal:  Positive for abdominal distention and nausea. Negative for abdominal pain, diarrhea and vomiting.       Positive for food getting stuck  Psychiatric/Behavioral:  Negative for confusion.     Vital Signs: There were no vitals taken for this visit.    Physical Exam HENT:     Head: Normocephalic and atraumatic.     Mouth/Throat:      Mouth: Mucous membranes are moist.     Pharynx: Oropharynx is clear.  Cardiovascular:     Rate and Rhythm: Regular rhythm. Tachycardia present.  Pulmonary:     Effort: Pulmonary effort is normal. No respiratory distress.     Breath sounds: Normal breath sounds.  Abdominal:     Palpations: Abdomen is soft.     Tenderness: There is no abdominal tenderness.  Skin:    General: Skin is warm.  Neurological:     General: No focal deficit present.     Mental Status: He is alert and oriented to person, place, and time.  Psychiatric:        Mood and Affect: Mood normal.        Thought Content: Thought content normal.        Judgment: Judgment normal.     Imaging: CT ABDOMEN PELVIS W CONTRAST Result Date: 12/15/2023 CLINICAL DATA:  Nausea and bloating. Mass lesion. * Tracking Code: BO * EXAM: CT ABDOMEN AND PELVIS WITH CONTRAST TECHNIQUE: Multidetector CT imaging of the abdomen and pelvis was performed using the standard protocol following bolus administration of intravenous contrast. RADIATION DOSE REDUCTION: This exam was performed according to the departmental dose-optimization program which includes automated exposure control, adjustment of the mA and/or kV according to patient size and/or use of iterative reconstruction technique. CONTRAST:  80mL OMNIPAQUE IOHEXOL 300 MG/ML  SOLN COMPARISON:  CT 2008 FINDINGS: Lower chest: Breathing motion lung bases. There is some linear opacity at the bases likely scar or atelectasis. Mild ground-glass. No pleural effusion. Hepatobiliary: Gallbladder is mildly distended. No intrahepatic biliary ductal dilatation. Pancreas: No intrinsic pancreatic mass Spleen: Spleen is enlarged with a cephalocaudal length of 14.8 cm. Preserved enhancement. Adrenals/Urinary Tract: Adrenal glands are preserved. No enhancing renal mass. There is mild right-sided renal collecting system dilatation. The course of the ureter in the mid retroperitoneum becomes encased by retroperitoneal  mass and may be contributing to the collecting system dilatation. No left-sided renal collecting system dilatation. The left ureter has a normal course and caliber down to the bladder. Preserved contours of the underdistended urinary bladder. Stomach/Bowel: Large bowel has a normal course and caliber scattered colonic stool. Left-sided colonic diverticula. Stomach is distended with fluid. The proximal duodenal is distended with fluid. The third portion is encased by soft tissue mass. The small bowel more distally is nondilated. There are several areas of small bowel which are poorly defined with the extensive mesenteric mass. Vascular/Lymphatic: Normal caliber aorta with scattered vascular calcifications along the aorta and branch vessels. The IVC is displaced anterior by a retroperitoneal mass and is somewhat narrowed and encased. Lumen is patent. There is also encasement of the renal vasculature, mesenteric vasculature. There is large confluence central mesenteric mass again encasing vessels. In the axial plane on series 3, image 51 for example of the mesenteric lesion measures 21.8 x 8.9 cm. Cephalocaudal extent of  the lesion which is confluence approaches 20.7 cm. There is also significant components retroperitoneal as stated above. The mass lesion for example encasing the retroperitoneal vessels and structures on image 36 of series 3 would measured 12.2 x 7.3 cm. Separate retrocrural lesion on the right measuring 2.9 by 1.6 cm. Reproductive: Prostate is unremarkable. Other: Mild ascites. Scattered mesenteric haziness. Few varices identified in the mesentery Musculoskeletal: Scattered degenerative changes of the spine and pelvis. Critical Value/emergent results were called by telephone at the time of interpretation on 12/15/2023 at 15:30 pm to provider PA Cruz Condon, who verbally acknowledged these results. IMPRESSION: Large soft tissue mass along the mesentery measuring up to 21 cm. There is encasement of  central mesenteric vessels as well as loss of tissue planes between mass and several bowel loops. There is also a caliber change noted along the bowel involving the third portion of the duodenum. A very mild partial obstruction would be in the differential associated with this mass. Please correlate with any specific symptoms of slow gastric emptying. There are also components of mass along the retroperitoneum with encasement of retroperitoneal structures and narrowing of vasculature with some varices. Overall appearance would be worrisome for malignancy such as lymphoma. The spleen is slightly enlarged without intrinsic splenic mass. Mild ascites. Colonic diverticula. Electronically Signed   By: Karen Kays M.D.   On: 12/15/2023 15:38    Labs:  CBC: Recent Labs    12/15/23 1125  WBC 7.4  HGB 13.9  HCT 42.1  PLT 290    COAGS: No results for input(s): "INR", "APTT" in the last 8760 hours.  BMP: Recent Labs    12/15/23 1125  NA 138  K 4.3  CL 106  CO2 23  GLUCOSE 103*  BUN 16  CALCIUM 8.5*  CREATININE 1.42*  GFRNONAA 55*    LIVER FUNCTION TESTS: Recent Labs    12/15/23 1125  BILITOT 0.8  AST 19  ALT 17  ALKPHOS 80  PROT 6.5  ALBUMIN 4.0    TUMOR MARKERS: No results for input(s): "AFPTM", "CEA", "CA199", "CHROMGRNA" in the last 8760 hours.  Assessment and Plan: William Mathews is a 63 y.o. male without previous significant medical history. Patient presented to ER 12/15/23 with c/o abdominal pain after eating and abdominal distension. CT abdomen/pelvis 12/15/23 reported a large soft tissue mass along the mesentery measuring up to 21 cm. This encased the central mesenteric vessels. There was evidence of caliber change along the bowel involving the third portion of the duodenum. Dr. Pete Glatter. Smith Robert was consulted while the patient was in the hospital. On the initial exam, there was a palpable mass in the left upper quadrant and epigastrium. Patient was discharged home with a  outpatient follow up for a biopsy.   Patient reports that for 1 month he has experienced sensations of food getting stuck. Patient also reports abdominal bloating. Patient's daughter is present today. Denies: chest pain, shortness of breath, nausea, vomiting, diarrhea, abdominal pain, fever, and/or cough.   Risks and benefits of mesenteric biopsy was discussed with the patient and/or patient's family including, but not limited to bleeding, infection, damage to adjacent structures or low yield requiring additional tests.  All of the questions were answered and there is agreement to proceed.  Consent signed and in chart.  Thank you for this interesting consult.  I greatly enjoyed meeting [address] and look forward to participating in their care.  A copy of this report was sent to the requesting provider  on this date.  Electronically Signed: Rosalita Levan, PA 12/20/2023, 1:37 PM   I spent a total of  15 Minutes   in face to face in clinical consultation, greater than 50% of which was counseling/coordinating care for mesenteric mass

## 2023-12-20 NOTE — Telephone Encounter (Signed)
Called & spoke to Annabelle (patient's daughter) to let her know that Dr. Smith Robert says it is very important that they make it to Mr. Polman biopsy appointment tomorrow. The daughter verbally confirmed that they will be at the appointment tomorrow.

## 2023-12-21 ENCOUNTER — Ambulatory Visit
Admission: RE | Admit: 2023-12-21 | Discharge: 2023-12-21 | Disposition: A | Discharge status: Home / Self Care | Payer: 59 | Source: Ambulatory Visit | Attending: Oncology | Admitting: Oncology

## 2023-12-21 ENCOUNTER — Other Ambulatory Visit: Payer: Self-pay

## 2023-12-21 DIAGNOSIS — K6389 Other specified diseases of intestine: Secondary | ICD-10-CM

## 2023-12-21 DIAGNOSIS — R590 Localized enlarged lymph nodes: Secondary | ICD-10-CM | POA: Diagnosis not present

## 2023-12-21 DIAGNOSIS — R109 Unspecified abdominal pain: Secondary | ICD-10-CM | POA: Diagnosis not present

## 2023-12-21 DIAGNOSIS — R1902 Left upper quadrant abdominal swelling, mass and lump: Secondary | ICD-10-CM | POA: Diagnosis not present

## 2023-12-21 DIAGNOSIS — R1909 Other intra-abdominal and pelvic swelling, mass and lump: Secondary | ICD-10-CM | POA: Diagnosis present

## 2023-12-21 DIAGNOSIS — C8513 Unspecified B-cell lymphoma, intra-abdominal lymph nodes: Secondary | ICD-10-CM | POA: Diagnosis not present

## 2023-12-21 LAB — CBC
HCT: 43.8 % (ref 39.0–52.0)
Hemoglobin: 14.9 g/dL (ref 13.0–17.0)
MCH: 29.6 pg (ref 26.0–34.0)
MCHC: 34 g/dL (ref 30.0–36.0)
MCV: 87.1 fL (ref 80.0–100.0)
Platelets: 351 10*3/uL (ref 150–400)
RBC: 5.03 MIL/uL (ref 4.22–5.81)
RDW: 13.2 % (ref 11.5–15.5)
WBC: 9.5 10*3/uL (ref 4.0–10.5)
nRBC: 0 % (ref 0.0–0.2)

## 2023-12-21 LAB — PROTIME-INR
INR: 1.1 (ref 0.8–1.2)
Prothrombin Time: 14 s (ref 11.4–15.2)

## 2023-12-21 MED ORDER — SODIUM CHLORIDE 0.9 % IV SOLN: INTRAVENOUS | Status: DC

## 2023-12-21 MED ORDER — MIDAZOLAM HCL 2 MG/2ML IJ SOLN
INTRAMUSCULAR | Status: AC | PRN
  Administered 2023-12-21 (×2): 1 mg via INTRAVENOUS

## 2023-12-21 MED ORDER — MIDAZOLAM HCL 2 MG/2ML IJ SOLN
INTRAMUSCULAR | Status: AC
  Filled 2023-12-21: qty 2

## 2023-12-21 MED ORDER — LIDOCAINE HCL (PF) 1 % IJ SOLN
10.0000 mL | Freq: Once | INTRAMUSCULAR | Status: AC
  Administered 2023-12-21: 10 mL via INTRADERMAL
  Filled 2023-12-21: qty 10

## 2023-12-21 MED ORDER — FENTANYL CITRATE (PF) 100 MCG/2ML IJ SOLN
INTRAMUSCULAR | Status: AC | PRN
  Administered 2023-12-21: 25 ug via INTRAVENOUS
  Administered 2023-12-21: 50 ug via INTRAVENOUS
  Administered 2023-12-21: 25 ug via INTRAVENOUS

## 2023-12-21 MED ORDER — FENTANYL CITRATE (PF) 100 MCG/2ML IJ SOLN
INTRAMUSCULAR | Status: AC
  Filled 2023-12-21: qty 2

## 2023-12-21 NOTE — Procedures (Signed)
Interventional Radiology Procedure Note  Procedure: CT Guided Biopsy of abdominal mass  Complications: None  Estimated Blood Loss: < 10 mL  Findings: 18 G core biopsy of mesenteric abdominal mass performed under CT guidance.  Five core samples obtained and sent to Pathology.  Jodi Marble. Fredia Sorrow, M.D Pager:  [phone]

## 2023-12-21 NOTE — Progress Notes (Signed)
Patient clinically stable post CT Abdominal mass biopsy per Dr Fredia Sorrow, tolerated well. Vitals stable pre and post procedure. Denies complaints post procedure. Received Versed 2 mg along with Fentanyl 100 mcg IV for procedure. Report given to Marni Griffon Rn post procedure/specials/15.

## 2023-12-22 ENCOUNTER — Ambulatory Visit
Admission: RE | Admit: 2023-12-22 | Discharge: 2023-12-22 | Disposition: A | Discharge status: Home / Self Care | Payer: 59 | Source: Ambulatory Visit | Attending: Oncology | Admitting: Oncology

## 2023-12-22 DIAGNOSIS — R590 Localized enlarged lymph nodes: Secondary | ICD-10-CM | POA: Diagnosis not present

## 2023-12-22 DIAGNOSIS — K6389 Other specified diseases of intestine: Secondary | ICD-10-CM | POA: Insufficient documentation

## 2023-12-22 DIAGNOSIS — K603 Anal fistula, unspecified: Secondary | ICD-10-CM

## 2023-12-22 LAB — GLUCOSE, CAPILLARY: Glucose-Capillary: 74 mg/dL (ref 70–99)

## 2023-12-22 MED ORDER — FLUDEOXYGLUCOSE F - 18 (FDG) INJECTION
8.8500 | Freq: Once | INTRAVENOUS | Status: AC | PRN
  Administered 2023-12-22: 8.85 via INTRAVENOUS

## 2023-12-25 LAB — SURGICAL PATHOLOGY

## 2023-12-27 ENCOUNTER — Inpatient Hospital Stay: Payer: 59

## 2023-12-27 ENCOUNTER — Inpatient Hospital Stay: Payer: 59 | Attending: Oncology | Admitting: Oncology

## 2023-12-27 ENCOUNTER — Ambulatory Visit (INDEPENDENT_AMBULATORY_CARE_PROVIDER_SITE_OTHER): Payer: 59 | Admitting: Surgery

## 2023-12-27 ENCOUNTER — Encounter: Payer: Self-pay | Admitting: Oncology

## 2023-12-27 ENCOUNTER — Encounter: Payer: Self-pay | Admitting: Surgery

## 2023-12-27 ENCOUNTER — Telehealth: Payer: Self-pay | Admitting: Surgery

## 2023-12-27 ENCOUNTER — Other Ambulatory Visit: Payer: Self-pay

## 2023-12-27 VITALS — BP 110/70 | HR 98 | Ht 64.0 in | Wt 175.0 lb

## 2023-12-27 DIAGNOSIS — C8213 Follicular lymphoma grade II, intra-abdominal lymph nodes: Secondary | ICD-10-CM

## 2023-12-27 DIAGNOSIS — Z809 Family history of malignant neoplasm, unspecified: Secondary | ICD-10-CM | POA: Diagnosis not present

## 2023-12-27 DIAGNOSIS — Z79624 Long term (current) use of inhibitors of nucleotide synthesis: Secondary | ICD-10-CM | POA: Diagnosis not present

## 2023-12-27 DIAGNOSIS — R221 Localized swelling, mass and lump, neck: Secondary | ICD-10-CM | POA: Diagnosis not present

## 2023-12-27 DIAGNOSIS — K56699 Other intestinal obstruction unspecified as to partial versus complete obstruction: Secondary | ICD-10-CM | POA: Insufficient documentation

## 2023-12-27 DIAGNOSIS — R188 Other ascites: Secondary | ICD-10-CM | POA: Insufficient documentation

## 2023-12-27 DIAGNOSIS — C8333 Diffuse large B-cell lymphoma, intra-abdominal lymph nodes: Secondary | ICD-10-CM

## 2023-12-27 DIAGNOSIS — Z7189 Other specified counseling: Secondary | ICD-10-CM

## 2023-12-27 DIAGNOSIS — Z800 Family history of malignant neoplasm of digestive organs: Secondary | ICD-10-CM | POA: Diagnosis not present

## 2023-12-27 DIAGNOSIS — Z87891 Personal history of nicotine dependence: Secondary | ICD-10-CM | POA: Insufficient documentation

## 2023-12-27 DIAGNOSIS — K5730 Diverticulosis of large intestine without perforation or abscess without bleeding: Secondary | ICD-10-CM | POA: Insufficient documentation

## 2023-12-27 DIAGNOSIS — K828 Other specified diseases of gallbladder: Secondary | ICD-10-CM | POA: Diagnosis not present

## 2023-12-27 DIAGNOSIS — N1330 Unspecified hydronephrosis: Secondary | ICD-10-CM | POA: Diagnosis not present

## 2023-12-27 DIAGNOSIS — Z79899 Other long term (current) drug therapy: Secondary | ICD-10-CM | POA: Insufficient documentation

## 2023-12-27 LAB — CMP (CANCER CENTER ONLY)
ALT: 12 U/L (ref 0–44)
AST: 18 U/L (ref 15–41)
Albumin: 3.5 g/dL (ref 3.5–5.0)
Alkaline Phosphatase: 69 U/L (ref 38–126)
Anion gap: 6 (ref 5–15)
BUN: 12 mg/dL (ref 8–23)
CO2: 25 mmol/L (ref 22–32)
Calcium: 8.5 mg/dL — ABNORMAL LOW (ref 8.9–10.3)
Chloride: 106 mmol/L (ref 98–111)
Creatinine: 1.05 mg/dL (ref 0.61–1.24)
GFR, Estimated: >60 mL/min (ref >60)
Glucose, Bld: 93 mg/dL (ref 70–99)
Potassium: 4.5 mmol/L (ref 3.5–5.1)
Sodium: 137 mmol/L (ref 135–145)
Total Bilirubin: 0.5 mg/dL (ref 0.0–1.2)
Total Protein: 6.1 g/dL — ABNORMAL LOW (ref 6.5–8.1)

## 2023-12-27 LAB — CBC WITH DIFFERENTIAL (CANCER CENTER ONLY)
Abs Immature Granulocytes: 0.04 10*3/uL (ref 0.00–0.07)
Basophils Absolute: 0 10*3/uL (ref 0.0–0.1)
Basophils Relative: 1 %
Eosinophils Absolute: 0.1 10*3/uL (ref 0.0–0.5)
Eosinophils Relative: 1 %
HCT: 39.3 % (ref 39.0–52.0)
Hemoglobin: 13.2 g/dL (ref 13.0–17.0)
Immature Granulocytes: 1 %
Lymphocytes Relative: 11 %
Lymphs Abs: 0.9 10*3/uL (ref 0.7–4.0)
MCH: 28.9 pg (ref 26.0–34.0)
MCHC: 33.6 g/dL (ref 30.0–36.0)
MCV: 86 fL (ref 80.0–100.0)
Monocytes Absolute: 0.7 10*3/uL (ref 0.1–1.0)
Monocytes Relative: 9 %
Neutro Abs: 5.9 10*3/uL (ref 1.7–7.7)
Neutrophils Relative %: 77 %
Platelet Count: 365 10*3/uL (ref 150–400)
RBC: 4.57 MIL/uL (ref 4.22–5.81)
RDW: 12.8 % (ref 11.5–15.5)
WBC Count: 7.6 10*3/uL (ref 4.0–10.5)
nRBC: 0 % (ref 0.0–0.2)

## 2023-12-27 LAB — HEPATITIS C ANTIBODY: HCV Ab: NONREACTIVE

## 2023-12-27 LAB — HIV ANTIBODY (ROUTINE TESTING W REFLEX): HIV Screen 4th Generation wRfx: NONREACTIVE

## 2023-12-27 LAB — HEPATITIS B SURFACE ANTIGEN: Hepatitis B Surface Ag: NONREACTIVE

## 2023-12-27 LAB — LACTATE DEHYDROGENASE: LDH: 149 U/L (ref 98–192)

## 2023-12-27 MED ORDER — ACYCLOVIR 400 MG PO TABS: 400.0000 mg | ORAL_TABLET | Freq: Every day | ORAL | 3 refills | Status: DC

## 2023-12-27 MED ORDER — GABAPENTIN 300 MG PO CAPS
300.0000 mg | ORAL_CAPSULE | ORAL | Status: AC
  Administered 2023-12-28: 300 mg via ORAL

## 2023-12-27 MED ORDER — CHLORHEXIDINE GLUCONATE CLOTH 2 % EX PADS: 6.0000 | MEDICATED_PAD | Freq: Once | CUTANEOUS | Status: DC

## 2023-12-27 MED ORDER — CEFAZOLIN SODIUM-DEXTROSE 2-4 GM/100ML-% IV SOLN
2.0000 g | INTRAVENOUS | Status: AC
  Administered 2023-12-28: 2 g via INTRAVENOUS

## 2023-12-27 MED ORDER — LIDOCAINE-PRILOCAINE 2.5-2.5 % EX CREA: TOPICAL_CREAM | CUTANEOUS | 3 refills | Status: DC

## 2023-12-27 MED ORDER — ALLOPURINOL 300 MG PO TABS: 300.0000 mg | ORAL_TABLET | Freq: Every day | ORAL | 3 refills | Status: DC

## 2023-12-27 MED ORDER — PREDNISONE 20 MG PO TABS: 100.0000 mg | ORAL_TABLET | Freq: Every day | ORAL | 0 refills | Status: DC

## 2023-12-27 MED ORDER — CELECOXIB 200 MG PO CAPS
200.0000 mg | ORAL_CAPSULE | ORAL | Status: AC
  Administered 2023-12-28: 200 mg via ORAL

## 2023-12-27 MED ORDER — ACETAMINOPHEN 500 MG PO TABS
1000.0000 mg | ORAL_TABLET | ORAL | Status: AC
  Administered 2023-12-28: 1000 mg via ORAL

## 2023-12-27 MED ORDER — ONDANSETRON HCL 8 MG PO TABS: 8.0000 mg | ORAL_TABLET | Freq: Three times a day (TID) | ORAL | 1 refills | Status: DC | PRN

## 2023-12-27 MED ORDER — PROCHLORPERAZINE MALEATE 10 MG PO TABS: 10.0000 mg | ORAL_TABLET | Freq: Four times a day (QID) | ORAL | 1 refills | Status: DC | PRN

## 2023-12-27 MED ORDER — DEXAMETHASONE 4 MG PO TABS: 8.0000 mg | ORAL_TABLET | Freq: Every day | ORAL | 1 refills | Status: DC

## 2023-12-27 NOTE — Progress Notes (Signed)
START ON PATHWAY REGIMEN - Lymphoma and CLL     A cycle is every 28 days:     Rituximab-xxxx      Bendamustine   **Always confirm dose/schedule in your pharmacy ordering system**  **Administration Notes: treatment will be given with intent to control  Patient Characteristics: Follicular Lymphoma, Grades 1, 2, and 3A, First Line, Stage III / IV, Symptomatic or Bulky Disease Disease Type: Follicular Lymphoma, Grade 1, 2, or 3A Disease Type: Not Applicable Disease Type: Not Applicable Line of Therapy: First Line Disease Characteristics: Symptomatic or Bulky Disease Intent of Therapy: Curative Intent, Discussed with Patient

## 2023-12-27 NOTE — H&P (View-Only) (Signed)
Patient ID: William Mathews, male   DOB: 1961-01-04, 63 y.o.   MRN: [id number]  HPI William Mathews is a 63 y.o. male recently presented to the ER with symptoms of abdominal pain especially after eating and feeling of abdominal distention.  He had a CT abdomen and pelvis with contrast which showed large soft tissue mass along the mesentery measuring up to 21 cm with encasement of central mesenteric vessels and loss of tissue planes between the mass and several bowel loops.  Caliber change along the third portion of duodenum with partial mild obstruction.  Findings concerning for lymphoma.  Serum creatinine mildly elevated at 1.4.  Spleen slightly enlarged without intrinsic splenic mass.  Patient denies any significant nausea or vomiting.  His last bowel movement was today.  He reports early satiety.  Denies any significant weight loss He endorses early satiety some dysphagia.  No current abdominal pain no fevers no chills no type B symptoms. 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  He also had a PET/CT that have personally reviewed showing evidence of a left supraclavicular neck mass.    HPI  No past medical history on file.  Past Surgical History:  Procedure Laterality Date   FISTULOTOMY N/A 01/10/2018   Procedure: FISTULOTOMY;  Surgeon: Carolan Shiver, MD;  Location: [facility] ORS;  Service: General;  Laterality: N/A;    Family History  Problem Relation Age of Onset   Colon cancer Mother    Pancreatic cancer Father    Cancer Sister     Social History Social History   Tobacco Use   Smoking status: Former    Types: Cigarettes   Smokeless tobacco: Never  Vaping Use   Vaping status: Never Used  Substance Use Topics    Alcohol use: Not Currently   Drug use: Not Currently    Types: Cocaine    Comment: years ago     No Known Allergies  Current Outpatient Medications  Medication Sig Dispense Refill   cyclobenzaprine (FLEXERIL) 10 MG tablet Take 1 tablet (10 mg total) by mouth 3 (three) times daily as needed. (Patient not taking: Reported on 01/10/2018) 15 tablet 0   hydrocortisone (ANUSOL-HC) 25 MG suppository Place 25 mg rectally 2 (two) times daily. (Patient not taking: Reported on 12/27/2023)     ibuprofen (ADVIL,MOTRIN) 600 MG tablet Take 1 tablet (600 mg total) by mouth every 8 (eight) hours as needed. (Patient not taking: Reported on 12/27/2023) 15 tablet 0   traMADol (ULTRAM) 50 MG tablet Take 1 tablet (50 mg total) by mouth every 6 (six) hours as needed for moderate pain. (Patient not taking: Reported on 01/10/2018) 12 tablet 0   No current facility-administered medications for this visit.     Review of Systems Full ROS  was asked and was negative except for the information on the HPI  Physical Exam There were no vitals taken for this visit. CONSTITUTIONAL: NAD. EYES: Pupils are equal, round, and reactive to light, Sclera are non-icteric. EARS, NOSE, MOUTH AND THROAT: The oropharynx is clear. The oral mucosa is pink and moist. Hearing is intact to voice. LYMPH NODES:  Lymph nodes in the neck are normal. RESPIRATORY:  Lungs are clear. There is normal respiratory effort, with equal breath sounds bilaterally,  and without pathologic use of accessory muscles. CARDIOVASCULAR: Heart is regular without murmurs, gallops, or rubs. Neck: Left supraclavicular mass firm and deep GI: The abdomen is  soft, nontender, and nondistended. There are no palpable masses. There is no hepatosplenomegaly. There are normal bowel sounds in all quadrants. GU: Rectal deferred.   MUSCULOSKELETAL: Normal muscle strength and tone. No cyanosis or edema.   SKIN: Turgor is good and there are no pathologic skin lesions or  ulcers. NEUROLOGIC: Motor and sensation is grossly normal. Cranial nerves are grossly intact. PSYCH:  Oriented to person, place and time. Affect is normal.  Data Reviewed  I have personally reviewed the patient's imaging, laboratory findings and medical records.    Assessment/Plan 63 year old male with diffuse lymphadenopathy the and mesenteric mass consistent with lymphoma.  He is in need for further tissue sampling.  I do think that the left neck is ideal.  She is also in need for port placement for chemotherapy. He wants to get this done as soon as possible.  I do think that we can feed him and schedule tomorrow.  I had an extensive discussion with the patient about his disease process and about the proposed procedure.  The risk the benefit and the possible complications including but not limited to: Bleeding, infection injury to adjacent structures pneumothoraces.  Understands and wished to proceed as soon as possible. Extensive counseling provider    Sterling Big, MD FACS General Surgeon 12/27/2023, 12:01 PM

## 2023-12-27 NOTE — Progress Notes (Signed)
Patient ID: RANALD ALESSIO, male   DOB: 1961-01-04, 63 y.o.   MRN: [id number]  HPI William Mathews is a 63 y.o. male recently presented to the ER with symptoms of abdominal pain especially after eating and feeling of abdominal distention.  He had a CT abdomen and pelvis with contrast which showed large soft tissue mass along the mesentery measuring up to 21 cm with encasement of central mesenteric vessels and loss of tissue planes between the mass and several bowel loops.  Caliber change along the third portion of duodenum with partial mild obstruction.  Findings concerning for lymphoma.  Serum creatinine mildly elevated at 1.4.  Spleen slightly enlarged without intrinsic splenic mass.  Patient denies any significant nausea or vomiting.  His last bowel movement was today.  He reports early satiety.  Denies any significant weight loss He endorses early satiety some dysphagia.  No current abdominal pain no fevers no chills no type B symptoms. 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  He also had a PET/CT that have personally reviewed showing evidence of a left supraclavicular neck mass.    HPI  No past medical history on file.  Past Surgical History:  Procedure Laterality Date   FISTULOTOMY N/A 01/10/2018   Procedure: FISTULOTOMY;  Surgeon: Carolan Shiver, MD;  Location: [facility] ORS;  Service: General;  Laterality: N/A;    Family History  Problem Relation Age of Onset   Colon cancer Mother    Pancreatic cancer Father    Cancer Sister     Social History Social History   Tobacco Use   Smoking status: Former    Types: Cigarettes   Smokeless tobacco: Never  Vaping Use   Vaping status: Never Used  Substance Use Topics    Alcohol use: Not Currently   Drug use: Not Currently    Types: Cocaine    Comment: years ago     No Known Allergies  Current Outpatient Medications  Medication Sig Dispense Refill   cyclobenzaprine (FLEXERIL) 10 MG tablet Take 1 tablet (10 mg total) by mouth 3 (three) times daily as needed. (Patient not taking: Reported on 01/10/2018) 15 tablet 0   hydrocortisone (ANUSOL-HC) 25 MG suppository Place 25 mg rectally 2 (two) times daily. (Patient not taking: Reported on 12/27/2023)     ibuprofen (ADVIL,MOTRIN) 600 MG tablet Take 1 tablet (600 mg total) by mouth every 8 (eight) hours as needed. (Patient not taking: Reported on 12/27/2023) 15 tablet 0   traMADol (ULTRAM) 50 MG tablet Take 1 tablet (50 mg total) by mouth every 6 (six) hours as needed for moderate pain. (Patient not taking: Reported on 01/10/2018) 12 tablet 0   No current facility-administered medications for this visit.     Review of Systems Full ROS  was asked and was negative except for the information on the HPI  Physical Exam There were no vitals taken for this visit. CONSTITUTIONAL: NAD. EYES: Pupils are equal, round, and reactive to light, Sclera are non-icteric. EARS, NOSE, MOUTH AND THROAT: The oropharynx is clear. The oral mucosa is pink and moist. Hearing is intact to voice. LYMPH NODES:  Lymph nodes in the neck are normal. RESPIRATORY:  Lungs are clear. There is normal respiratory effort, with equal breath sounds bilaterally,  and without pathologic use of accessory muscles. CARDIOVASCULAR: Heart is regular without murmurs, gallops, or rubs. Neck: Left supraclavicular mass firm and deep GI: The abdomen is  soft, nontender, and nondistended. There are no palpable masses. There is no hepatosplenomegaly. There are normal bowel sounds in all quadrants. GU: Rectal deferred.   MUSCULOSKELETAL: Normal muscle strength and tone. No cyanosis or edema.   SKIN: Turgor is good and there are no pathologic skin lesions or  ulcers. NEUROLOGIC: Motor and sensation is grossly normal. Cranial nerves are grossly intact. PSYCH:  Oriented to person, place and time. Affect is normal.  Data Reviewed  I have personally reviewed the patient's imaging, laboratory findings and medical records.    Assessment/Plan 63 year old male with diffuse lymphadenopathy the and mesenteric mass consistent with lymphoma.  He is in need for further tissue sampling.  I do think that the left neck is ideal.  She is also in need for port placement for chemotherapy. He wants to get this done as soon as possible.  I do think that we can feed him and schedule tomorrow.  I had an extensive discussion with the patient about his disease process and about the proposed procedure.  The risk the benefit and the possible complications including but not limited to: Bleeding, infection injury to adjacent structures pneumothoraces.  Understands and wished to proceed as soon as possible. Extensive counseling provider    William Big, MD FACS General Surgeon 12/27/2023, 12:01 PM

## 2023-12-27 NOTE — Patient Instructions (Signed)
We have seen you today and have spoken about your port placement and neck biopsy. This will be scheduled at [facility] with Dr. Everlene Farrier for 12/28/23.  Do not eat or drink anything after midnight tonight and nothing in the morning.   Please see the [facility]) Sheet provided for further details. Our surgery scheduler will call you to go over surgery information.   Please call our office with any questions or concerns that you have.   Port-a-Cath [facility])   A central line is a soft, flexible tube (catheter) that can be used to collect blood for testing or to give medicine or nutrition through a vein. The tip of the central line ends in a large vein just above the heart called the vena cava. A central line may be placed because: You need to get medicines or fluids through an IV tube for a long period of time. You need nutrition but cannot eat or absorb nutrients. The veins in your hands or arms are hard to access. You need to have blood taken often for blood tests. You need a blood transfusion You need chemotherapy or dialysis.  There are many types of central lines: Peripherally inserted central catheter (PICC) line. This type is used for intermediate access to long-term access of one week or more. It can be used to draw blood and give fluids or medicines. A PICC looks like an IV tube, but it goes up the arm to the heart. It is usually inserted in the upper arm and taped in place on the arm. Tunneled central line. This type is used for long-term therapy and dialysis. It is placed in a large vein in the neck, chest, or groin. A tunneled central line is inserted through a small incision made over the vein and is advanced into the heart. It is tunneled beneath the skin and brought out through a second incision. Non-tunneled central line. This type is used for short-term access, usually of a maximum of 7 days. It is often used in the emergency department. A non-tunneled central line is inserted in  the neck, chest, or groin. Implanted port. This type is used for long-term therapy. It can stay in place longer than other types of central lines. An implanted port is normally inserted in the upper chest but can also be placed in the upper arm or in the abdomen. It is inserted and removed with surgery, and it is accessed using a special needle.  The type of central line that you receive depends on how long you will need it, your medical condition, and the condition of your veins. What are the risks? Using any type of central line has risks that you should be aware of, including: Infection. A blood clot that blocks the central line or forms in the vein and travels to the heart. Bleeding from the place where the central line was put in. Developing a hole or crack within the central line. If this happens, the central line will need to be replaced. Developing an abnormal heart rhythm (arrhythmia). This is rare. Central line failure.  Follow these instructions at home: Flushing and cleaning the central line Follow instructions from the health care provider about flushing and cleaning the central line. Wear a mask when flushing or cleaning the central line. Before you flush or clean the central line: Wash your hands with soap and water. Clean the central line hub with rubbing alcohol. Insertion site care Keep the insertion site of your central line clean  and dry at all times. Check your incision or central line site every day for signs of infection. Check for: More redness, swelling, or pain. More fluid or blood. Warmth. Pus or a bad smell. General instructions Follow instructions from your health care provider for the type of device that you have. If the central line accidentally gets pulled on, make sure: The bandage (dressing) is okay. There is no bleeding. The line has not been pulled out. Return to your normal activities as told by your health care provider. Ask your health care  provider what activities are safe for you. You may be restricted from lifting or making repetitive arm movements on the side with the catheter. Do not swim or bathe unless your health care provider approves. Keep your dressing dry. Your health care provider can instruct you about how to keep your specific type of dressing from getting wet. Keep all follow-up visits as told by your health care provider. This is important. Contact a health care provider if: You have more redness, swelling, or pain around your incision. You have more fluid or blood coming from your incision. Your incision feels warm to the touch. You have pus or a bad smell coming from your incision. Get help right away if: You have: Chills. A fever. Shortness of breath. Trouble breathing. Chest pain. Swelling in your neck, face, chest, or arm on the side of your central line. You are coughing. You feel your heart beating rapidly or skipping beats. You feel dizzy or you faint. Your incision or central line site has red streaks spreading away from the area. Your incision or central line site is bleeding and does not stop. Your central line is difficult to flush or will not flush. You do not get a blood return from the central line. Your central line gets loose or comes out. Your central line gets damaged. Your catheter leaks when flushed or when fluids are infused into it. This information is not intended to replace advice given to you by your health care provider. Make sure you discuss any questions you have with your health care provider. Document Released: 01/05/2006 Document Revised: 07/13/2016 Document Reviewed: 06/22/2016 Elsevier Interactive Patient Education  2017 [employer].

## 2023-12-27 NOTE — Telephone Encounter (Signed)
Patient and daughter have been advised of Pre-Admission date/time, and Surgery date at [facility] while in office.  Surgery is being added on for the following day.    Surgery Date: 12/28/23, patient to arrive 2 hours early, @ 8:30 am.   Preadmission Testing Date: 12/28/23 2 hours early.   Patient to be NPO after midnight.

## 2023-12-28 ENCOUNTER — Ambulatory Visit: Payer: 59

## 2023-12-28 ENCOUNTER — Ambulatory Visit: Payer: 59 | Admitting: Anesthesiology

## 2023-12-28 ENCOUNTER — Encounter
Admission: RE | Disposition: A | Discharge status: Home / Self Care | Payer: Self-pay | Source: Home / Self Care | Attending: Surgery

## 2023-12-28 ENCOUNTER — Other Ambulatory Visit: Payer: Self-pay

## 2023-12-28 ENCOUNTER — Encounter: Payer: Self-pay | Admitting: Surgery

## 2023-12-28 ENCOUNTER — Ambulatory Visit
Admission: RE | Admit: 2023-12-28 | Discharge: 2023-12-28 | Disposition: A | Discharge status: Home / Self Care | Payer: 59 | Attending: Surgery | Admitting: Surgery

## 2023-12-28 ENCOUNTER — Encounter: Payer: Self-pay | Admitting: Oncology

## 2023-12-28 DIAGNOSIS — C8213 Follicular lymphoma grade II, intra-abdominal lymph nodes: Secondary | ICD-10-CM | POA: Diagnosis not present

## 2023-12-28 DIAGNOSIS — C8331 Diffuse large B-cell lymphoma, lymph nodes of head, face, and neck: Secondary | ICD-10-CM | POA: Insufficient documentation

## 2023-12-28 DIAGNOSIS — Z87891 Personal history of nicotine dependence: Secondary | ICD-10-CM | POA: Diagnosis not present

## 2023-12-28 DIAGNOSIS — Z419 Encounter for procedure for purposes other than remedying health state, unspecified: Secondary | ICD-10-CM

## 2023-12-28 DIAGNOSIS — C8333 Diffuse large B-cell lymphoma, intra-abdominal lymph nodes: Secondary | ICD-10-CM

## 2023-12-28 DIAGNOSIS — R221 Localized swelling, mass and lump, neck: Secondary | ICD-10-CM | POA: Diagnosis not present

## 2023-12-28 HISTORY — PX: PORTACATH PLACEMENT: SHX2246

## 2023-12-28 HISTORY — PX: MASS BIOPSY: SHX5445

## 2023-12-28 SURGERY — BIOPSY, MASS, NECK
Anesthesia: General

## 2023-12-28 MED ORDER — HEPARIN SODIUM (PORCINE) 5000 UNIT/ML IJ SOLN
INTRAMUSCULAR | Status: AC
  Filled 2023-12-28: qty 1

## 2023-12-28 MED ORDER — DROPERIDOL 2.5 MG/ML IJ SOLN: 0.6250 mg | Freq: Once | INTRAMUSCULAR | Status: DC | PRN

## 2023-12-28 MED ORDER — GABAPENTIN 300 MG PO CAPS
ORAL_CAPSULE | ORAL | Status: AC
  Filled 2023-12-28: qty 1

## 2023-12-28 MED ORDER — HYDROMORPHONE HCL 1 MG/ML IJ SOLN
INTRAMUSCULAR | Status: DC | PRN
Start: 2023-12-28 — End: 2023-12-29
  Administered 2023-12-28 (×2): .5 mg via INTRAVENOUS

## 2023-12-28 MED ORDER — LIDOCAINE HCL (PF) 2 % IJ SOLN
INTRAMUSCULAR | Status: AC
  Filled 2023-12-28: qty 5

## 2023-12-28 MED ORDER — DEXAMETHASONE SODIUM PHOSPHATE 10 MG/ML IJ SOLN
INTRAMUSCULAR | Status: AC
  Filled 2023-12-28: qty 1

## 2023-12-28 MED ORDER — FENTANYL CITRATE (PF) 100 MCG/2ML IJ SOLN
INTRAMUSCULAR | Status: DC | PRN
  Administered 2023-12-28: 25 ug via INTRAVENOUS
  Administered 2023-12-28: 50 ug via INTRAVENOUS
  Administered 2023-12-28: 25 ug via INTRAVENOUS

## 2023-12-28 MED ORDER — PROPOFOL 10 MG/ML IV BOLUS
INTRAVENOUS | Status: AC
  Filled 2023-12-28: qty 20

## 2023-12-28 MED ORDER — FENTANYL CITRATE (PF) 100 MCG/2ML IJ SOLN
INTRAMUSCULAR | Status: AC
  Filled 2023-12-28: qty 2

## 2023-12-28 MED ORDER — OXYCODONE HCL 5 MG PO TABS
5.0000 mg | ORAL_TABLET | Freq: Once | ORAL | Status: AC
  Administered 2023-12-28: 5 mg via ORAL

## 2023-12-28 MED ORDER — ORAL CARE MOUTH RINSE: 15.0000 mL | Freq: Once | OROMUCOSAL | Status: AC

## 2023-12-28 MED ORDER — CHLORHEXIDINE GLUCONATE 0.12 % MT SOLN
OROMUCOSAL | Status: AC
  Filled 2023-12-28: qty 15

## 2023-12-28 MED ORDER — OXYCODONE HCL 5 MG PO TABS: 15.0000 mg | ORAL_TABLET | Freq: Once | ORAL | Status: DC

## 2023-12-28 MED ORDER — CELECOXIB 200 MG PO CAPS
ORAL_CAPSULE | ORAL | Status: AC
  Filled 2023-12-28: qty 1

## 2023-12-28 MED ORDER — PROPOFOL 10 MG/ML IV BOLUS
INTRAVENOUS | Status: DC | PRN
  Administered 2023-12-28: 150 mg via INTRAVENOUS
  Administered 2023-12-28: 50 mg via INTRAVENOUS

## 2023-12-28 MED ORDER — LIDOCAINE HCL (PF) 2 % IJ SOLN
INTRAMUSCULAR | Status: DC | PRN
  Administered 2023-12-28: 100 mg via INTRADERMAL

## 2023-12-28 MED ORDER — LACTATED RINGERS IV SOLN: INTRAVENOUS | Status: DC

## 2023-12-28 MED ORDER — ONDANSETRON HCL 4 MG/2ML IJ SOLN
INTRAMUSCULAR | Status: AC
  Filled 2023-12-28: qty 2

## 2023-12-28 MED ORDER — MIDAZOLAM HCL 2 MG/2ML IJ SOLN
INTRAMUSCULAR | Status: AC
  Filled 2023-12-28: qty 2

## 2023-12-28 MED ORDER — OXYCODONE HCL 5 MG PO TABS
ORAL_TABLET | ORAL | Status: AC
  Filled 2023-12-28: qty 1

## 2023-12-28 MED ORDER — MIDAZOLAM HCL 2 MG/2ML IJ SOLN
INTRAMUSCULAR | Status: DC | PRN
  Administered 2023-12-28: 2 mg via INTRAVENOUS

## 2023-12-28 MED ORDER — ONDANSETRON HCL 4 MG/2ML IJ SOLN
INTRAMUSCULAR | Status: DC | PRN
  Administered 2023-12-28: 4 mg via INTRAVENOUS

## 2023-12-28 MED ORDER — FENTANYL CITRATE (PF) 100 MCG/2ML IJ SOLN: 25.0000 ug | INTRAMUSCULAR | Status: DC | PRN

## 2023-12-28 MED ORDER — PHENYLEPHRINE 80 MCG/ML (10ML) SYRINGE FOR IV PUSH (FOR BLOOD PRESSURE SUPPORT)
PREFILLED_SYRINGE | INTRAVENOUS | Status: DC | PRN
  Administered 2023-12-28: 100 ug via INTRAVENOUS
  Administered 2023-12-28: 50 ug via INTRAVENOUS
  Administered 2023-12-28 (×2): 100 ug via INTRAVENOUS

## 2023-12-28 MED ORDER — LIDOCAINE HCL (PF) 1 % IJ SOLN
INTRAMUSCULAR | Status: AC
  Filled 2023-12-28: qty 30

## 2023-12-28 MED ORDER — HYDROCODONE-ACETAMINOPHEN 5-325 MG PO TABS: 1.0000 | ORAL_TABLET | Freq: Four times a day (QID) | ORAL | 0 refills | Status: DC | PRN

## 2023-12-28 MED ORDER — CEFAZOLIN SODIUM-DEXTROSE 2-4 GM/100ML-% IV SOLN
INTRAVENOUS | Status: AC
  Filled 2023-12-28: qty 100

## 2023-12-28 MED ORDER — DEXAMETHASONE SODIUM PHOSPHATE 10 MG/ML IJ SOLN
INTRAMUSCULAR | Status: DC | PRN
  Administered 2023-12-28: 10 mg via INTRAVENOUS

## 2023-12-28 MED ORDER — CHLORHEXIDINE GLUCONATE 0.12 % MT SOLN
15.0000 mL | Freq: Once | OROMUCOSAL | Status: AC
  Administered 2023-12-28: 15 mL via OROMUCOSAL

## 2023-12-28 MED ORDER — HYDROMORPHONE HCL 1 MG/ML IJ SOLN
INTRAMUSCULAR | Status: AC
  Filled 2023-12-28: qty 1

## 2023-12-28 MED ORDER — ACETAMINOPHEN 500 MG PO TABS
ORAL_TABLET | ORAL | Status: AC
  Filled 2023-12-28: qty 2

## 2023-12-28 SURGICAL SUPPLY — 43 items
APPLIER CLIP 11 MED OPEN (CLIP)
BAG DECANTER FOR FLEXI CONT (MISCELLANEOUS) ×4 IMPLANT
BLADE CLIPPER SURG (BLADE) ×2 IMPLANT
BLADE SURG SZ11 CARB STEEL (BLADE) ×2 IMPLANT
CHLORAPREP W/TINT 26 (MISCELLANEOUS) ×2 IMPLANT
CLAMP SUTURE YELLOW 5 PAIRS (MISCELLANEOUS) ×2 IMPLANT
CLIP APPLIE 11 MED OPEN (CLIP) IMPLANT
DERMABOND ADVANCED .7 DNX12 (GAUZE/BANDAGES/DRESSINGS) ×2 IMPLANT
DRAPE C-ARM XRAY 36X54 (DRAPES) ×4 IMPLANT
DRAPE INCISE IOBAN 66X45 STRL (DRAPES) ×2 IMPLANT
DRAPE LAPAROTOMY 100X77 ABD (DRAPES) ×2 IMPLANT
DRAPE SHEET LG 3/4 BI-LAMINATE (DRAPES) ×2 IMPLANT
ELECT CAUTERY BLADE 6.4 (BLADE) ×2 IMPLANT
ELECT REM PT RETURN 9FT ADLT (ELECTROSURGICAL) ×2
ELECTRODE REM PT RTRN 9FT ADLT (ELECTROSURGICAL) ×2 IMPLANT
GAUZE 4X4 16PLY ~~LOC~~+RFID DBL (SPONGE) ×2 IMPLANT
GEL ULTRASOUND 20GR AQUASONIC (MISCELLANEOUS) ×2 IMPLANT
GLOVE BIO SURGEON STRL SZ7 (GLOVE) ×2 IMPLANT
GOWN STRL REUS W/ TWL LRG LVL3 (GOWN DISPOSABLE) ×4 IMPLANT
IV NS 500ML BAXH (IV SOLUTION) ×2 IMPLANT
KIT PORT INFUSION SMART 8FR (Port) ×2 IMPLANT
MANIFOLD NEPTUNE II (INSTRUMENTS) ×2 IMPLANT
NDL HYPO 22X1.5 SAFETY MO (MISCELLANEOUS) ×2 IMPLANT
NEEDLE HYPO 22X1.5 SAFETY MO (MISCELLANEOUS) ×2 IMPLANT
PACK BASIN MINOR ARMC (MISCELLANEOUS) ×2 IMPLANT
PACK PORT-A-CATH (MISCELLANEOUS) ×2 IMPLANT
SPONGE KITTNER 5P (MISCELLANEOUS) IMPLANT
SPONGE T-LAP 18X18 ~~LOC~~+RFID (SPONGE) ×2 IMPLANT
SPONGE T-LAP 4X18 ~~LOC~~+RFID (SPONGE) IMPLANT
SUT MNCRL 4-0 27 PS-2 XMFL (SUTURE) ×2
SUT MNCRL AB 4-0 PS2 18 (SUTURE) ×2 IMPLANT
SUT PROLENE 2-0 RB1 36X2 ARM (SUTURE) ×2
SUT PROLENE 5 0 RB 1 DA (SUTURE) IMPLANT
SUT VIC AB 3-0 SH 27X BRD (SUTURE) ×2 IMPLANT
SUTURE MNCRL 4-0 27XMF (SUTURE) ×2 IMPLANT
SUTURE PROLEN 2-0 RB1 36X2 ARM (SUTURE) ×2 IMPLANT
SYR 10ML LL (SYRINGE) ×2 IMPLANT
SYR 20ML LL LF (SYRINGE) ×2 IMPLANT
SYR 5ML LL (SYRINGE) ×2 IMPLANT
TAG SUTURE CLAMP YLW 5PR (MISCELLANEOUS) ×2
TOWEL OR 17X26 4PK STRL BLUE (TOWEL DISPOSABLE) ×2 IMPLANT
TRAP FLUID SMOKE EVACUATOR (MISCELLANEOUS) ×2 IMPLANT
WATER STERILE IRR 500ML POUR (IV SOLUTION) ×2 IMPLANT

## 2023-12-28 NOTE — Anesthesia Procedure Notes (Addendum)
Procedure Name: LMA Insertion Date/Time: 12/28/2023 10:31 AM  Performed by: Rich Brave, CRNAPre-anesthesia Checklist: Patient identified, Timeout performed, Emergency Drugs available, Suction available and Patient being monitored Patient Re-evaluated:Patient Re-evaluated prior to induction Oxygen Delivery Method: Circle system utilized Preoxygenation: Pre-oxygenation with 100% oxygen Induction Type: IV induction Ventilation: Mask ventilation without difficulty LMA: LMA inserted LMA Size: 4.0 Number of attempts: 1 Placement Confirmation: ETT inserted through vocal cords under direct vision, positive ETCO2 and breath sounds checked- equal and bilateral Tube secured with: Tape Dental Injury: Teeth and Oropharynx as per pre-operative assessment

## 2023-12-28 NOTE — Progress Notes (Signed)
 Pharmacist Chemotherapy Monitoring - Initial Assessment    Anticipated start date: 01/03/24   The following has been reviewed per standard work regarding the patient's treatment regimen: The patient's diagnosis, treatment plan and drug doses, and organ/hematologic function Lab orders and baseline tests specific to treatment regimen  The treatment plan start date, drug sequencing, and pre-medications Prior authorization status  Patient's documented medication list, including drug-drug interaction screen and prescriptions for anti-emetics and supportive care specific to the treatment regimen The drug concentrations, fluid compatibility, administration routes, and timing of the medications to be used The patient's access for treatment and lifetime cumulative dose history, if applicable  The patient's medication allergies and previous infusion related reactions, if applicable   Changes made to treatment plan:  N/A  Follow up needed:  N/A   Maudie FORBES Andreas, PharmD, BCPS Clinical Pharmacist   12/28/2023  11:15 AM

## 2023-12-28 NOTE — Anesthesia Preprocedure Evaluation (Signed)
Anesthesia Evaluation  Patient identified by MRN, date of birth, ID band Patient awake    Reviewed: Allergy & Precautions, NPO status , Patient's Chart, lab work & pertinent test results  History of Anesthesia Complications Negative for: history of anesthetic complications  Airway Mallampati: III  TM Distance: >3 FB Neck ROM: full    Dental  (+) Chipped   Pulmonary neg pulmonary ROS, former smoker   Pulmonary exam normal        Cardiovascular negative cardio ROS Normal cardiovascular exam     Neuro/Psych negative neurological ROS  negative psych ROS   GI/Hepatic negative GI ROS, Neg liver ROS,,,  Endo/Other  negative endocrine ROS    Renal/GU negative Renal ROS  negative genitourinary   Musculoskeletal   Abdominal   Peds  Hematology negative hematology ROS (+)   Anesthesia Other Findings History reviewed. No pertinent past medical history.  Past Surgical History: 01/10/2018: FISTULOTOMY; N/A     Comment:  Procedure: FISTULOTOMY;  Surgeon: Carolan Shiver,              MD;  Location: [facility] ORS;  Service: General;  Laterality:               N/A;  BMI    Body Mass Index: 30.04 kg/m      Reproductive/Obstetrics negative OB ROS                             Anesthesia Physical Anesthesia Plan  ASA: 2  Anesthesia Plan: General LMA   Post-op Pain Management: Tylenol PO (pre-op)*, Gabapentin PO (pre-op)* and Celebrex PO (pre-op)*   Induction: Intravenous  PONV Risk Score and Plan: 2 and Dexamethasone, Ondansetron, Midazolam and Treatment may vary due to age or medical condition  Airway Management Planned: Natural Airway and Nasal Cannula  Additional Equipment:   Intra-op Plan:   Post-operative Plan:   Informed Consent: I have reviewed the patients History and Physical, chart, labs and discussed the procedure including the risks, benefits and alternatives for the proposed  anesthesia with the patient or authorized representative who has indicated his/her understanding and acceptance.     Dental Advisory Given  Plan Discussed with: Anesthesiologist, CRNA and Surgeon  Anesthesia Plan Comments: (Patient consented for risks of anesthesia including but not limited to:  - adverse reactions to medications - risk of airway placement if required - damage to eyes, teeth, lips or other oral mucosa - nerve damage due to positioning  - sore throat or hoarseness - Damage to heart, brain, nerves, lungs, other parts of body or loss of life  Patient voiced understanding and assent.)        Anesthesia Quick Evaluation

## 2023-12-28 NOTE — Transfer of Care (Signed)
Immediate Anesthesia Transfer of Care Note  Patient: William Mathews  Procedure(s) Performed: NECK MASS BIOPSY (Left) INSERTION PORT-A-CATH  Patient Location: PACU  Anesthesia Type:General  Level of Consciousness: drowsy and patient cooperative  Airway & Oxygen Therapy: Patient Spontanous Breathing and Patient connected to face mask oxygen  Post-op Assessment: Report given to RN and Post -op Vital signs reviewed and stable  Post vital signs: Reviewed and stable  Last Vitals:  Vitals Value Taken Time  BP 124/88 12/28/23 1305  Temp 97.5   Pulse 103 12/28/23 1307  Resp 11 12/28/23 1307  SpO2 98 % 12/28/23 1307  Vitals shown include unfiled device data.  Last Pain:  Vitals:   12/28/23 0831  TempSrc: Temporal  PainSc: 0-No pain         Complications: No notable events documented.

## 2023-12-28 NOTE — Op Note (Addendum)
  Pre-operative Diagnosis: lymphoma  Post-operative Diagnosis: same   Surgeon: Sterling Big, MD FACS  Anesthesia: GETA  Procedure: Left Left Deep Neck excisional biopsy of lymph node  Left IJ  Port placement under fluoroscopy guidance via direct cutdown  Findings: Good position of the tip of the catheter by fluoroscopy Left deep supraclavicular node   Estimated Blood Loss: 20cc         Drains: None         Specimens: None       Complications: none          Procedure Details  The patient was seen again in the Holding Room. The benefits, complications, treatment options, and expected outcomes were discussed with the patient. The risks of bleeding, infection, recurrence of symptoms, failure to resolve symptoms,  thrombosis nonfunction breakage pneumothorax hemopneumothorax any of which could require chest tube or further surgery were reviewed with the patient.   The patient was taken to Operating Room, identified as [name] and the procedure verified.  A Time Out was held and the above information confirmed.  Prior to the induction of general anesthesia, antibiotic prophylaxis was administered. VTE prophylaxis was in place. Appropriate anesthesia was then administered and tolerated well. The chest and Neck were prepped with Chloraprep and draped in the sterile fashion. The patient was positioned in the supine position.   Left transverse incision was created in the neck and the Platysma was incised and retracted.  Fibers of the sternocleidomastoid were also retracted laterally in the standard fashion.  We were able to identify of the left internal jugular vein that was closely proximity to did deep cervical lymph node.  We were able to dissect the lymph node circumferentially and ligated the pedicle with medium size clips as well as the lymphatic channels. For able to excise the note under meticulous dissection and hemostasis was excellent.  Specimen was sent out for lymphoma workup.   We identified the carotid artery and protected at all times. Given that we had complete exposure to the left internal jugular vein I decided to access it directly. The large bore needle was placed into the internal jugular vein under direct visualization without difficulty and then the Seldinger wire was advanced. Fluoroscopy was utilized to confirm that the Seldinger wire was in the superior vena cava.  Due to the lack of muscle adherent to the jugular vein there was a larger phlebotomy as compared to the port tubing.  Using a 5-0 Prolene I was able to perform a repair the vein defect to make the placement of the catheter within the vein watertight, this was done using a figure of eight.   An incision was made and a port pocket developed with blunt and electrocautery dissection. The introducer dilator was placed over the Seldinger wire the wire was removed. The previously flushed catheter was placed into the introducer dilator and the peel-away sheath was removed. The catheter length was confirmed and trimmed utilizing fluoroscopy for proper positioning. The catheter was then attached to the previously flushed port. The port was placed into the pocket. The port was held in with 2-0 Prolenes and flushed for function and heparin locked.  The wound was closed with interrupted 3-0 Vicryl followed by 4-0 subcuticular Monocryl sutures. Dermabond used to coat the skin  Patient was taken to the recovery room in stable condition where a postoperative chest film has been ordered.

## 2023-12-28 NOTE — Interval H&P Note (Signed)
History and Physical Interval Note:  12/28/2023 9:02 AM  William Mathews  has presented today for surgery, with the diagnosis of Lymphoma B-Cell.  The various methods of treatment have been discussed with the patient and family. After consideration of risks, benefits and other options for treatment, the patient has consented to  Procedure(s): NECK MASS BIOPSY (Left) INSERTION PORT-A-CATH (N/A) as a surgical intervention.  The patient's history has been reviewed, patient examined, no change in status, stable for surgery.  I have reviewed the patient's chart and labs.  Questions were answered to the patient's satisfaction.     Glyn Gerads F Latiana Tomei

## 2023-12-28 NOTE — Anesthesia Procedure Notes (Signed)
Procedure Name: Intubation Date/Time: 12/28/2023 10:48 AM  Performed by: Rich Brave, CRNAPre-anesthesia Checklist: Patient identified, Timeout performed, Emergency Drugs available, Suction available and Patient being monitored Patient Re-evaluated:Patient Re-evaluated prior to induction Oxygen Delivery Method: Circle system utilized Preoxygenation: Pre-oxygenation with 100% oxygen Induction Type: IV induction Ventilation: Mask ventilation without difficulty Laryngoscope Size: McGrath and 4 Grade View: Grade I Tube size: 7.0 mm Number of attempts: 1 Airway Equipment and Method: Stylet and Video-laryngoscopy Placement Confirmation: ETT inserted through vocal cords under direct vision, positive ETCO2 and breath sounds checked- equal and bilateral Secured at: 21 cm Tube secured with: Tape Dental Injury: Teeth and Oropharynx as per pre-operative assessment

## 2023-12-28 NOTE — Discharge Instructions (Addendum)

## 2023-12-29 ENCOUNTER — Encounter: Payer: Self-pay | Admitting: Surgery

## 2023-12-29 ENCOUNTER — Other Ambulatory Visit: Payer: Self-pay

## 2023-12-29 ENCOUNTER — Telehealth: Payer: Self-pay | Admitting: *Deleted

## 2023-12-29 LAB — SURGICAL PATHOLOGY

## 2023-12-29 LAB — HEPATITIS B CORE ANTIBODY, TOTAL: HEP B CORE AB: NEGATIVE

## 2023-12-29 NOTE — Anesthesia Postprocedure Evaluation (Signed)
Anesthesia Post Note  Patient: SHAMEER MOLSTAD  Procedure(s) Performed: NECK MASS BIOPSY (Left) INSERTION PORT-A-CATH  Patient location during evaluation: PACU Anesthesia Type: General Level of consciousness: awake and alert Pain management: pain level controlled Vital Signs Assessment: post-procedure vital signs reviewed and stable Respiratory status: spontaneous breathing, nonlabored ventilation, respiratory function stable and patient connected to nasal cannula oxygen Cardiovascular status: blood pressure returned to baseline and stable Postop Assessment: no apparent nausea or vomiting Anesthetic complications: no   No notable events documented.   Last Vitals:  Vitals:   12/28/23 1432 12/28/23 1441  BP: 117/78 116/89  Pulse: 95 99  Resp: 12 14  Temp: (!) 36.1 C 36.6 C  SpO2: 92% 98%    Last Pain:  Vitals:   12/28/23 1441  TempSrc: Temporal  PainSc: 2                  Louie Boston

## 2023-12-29 NOTE — Telephone Encounter (Signed)
Patient would like to come pick up a return to note. He had surgery yesterday by Dr Everlene Farrier [facility]ck [facility]   He wants to return on 01/08/24

## 2023-12-29 NOTE — Telephone Encounter (Signed)
Message left for the patient letting him know his work note is ready at the front desk for pick up.

## 2023-12-30 ENCOUNTER — Encounter: Payer: Self-pay | Admitting: Oncology

## 2023-12-30 DIAGNOSIS — C8330 Diffuse large B-cell lymphoma, unspecified site: Secondary | ICD-10-CM | POA: Insufficient documentation

## 2023-12-30 MED ORDER — SULFAMETHOXAZOLE-TRIMETHOPRIM 800-160 MG PO TABS: 1.0000 | ORAL_TABLET | ORAL | 5 refills | Status: DC

## 2023-12-30 MED ORDER — ACYCLOVIR 400 MG PO TABS: 400.0000 mg | ORAL_TABLET | Freq: Two times a day (BID) | ORAL | 5 refills | Status: DC

## 2023-12-30 NOTE — Progress Notes (Signed)
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  He had a CT abdomen and pelvis with contrast which showed large soft tissue mass along the mesentery measuring up to 21 cm with encasement of central mesenteric vessels and loss of tissue planes between the mass and several bowel loops.  Caliber change along the third portion of duodenum with partial mild obstruction.  Findings concerning for lymphoma.  Serum creatinine mildly elevated at 1.4.  Spleen slightly enlarged without intrinsic splenic mass.  Patient denies any significant nausea or vomiting.  He has early satiety  PET CT scan from 12/22/2023 showed Hypermetabolic left supraclavicular mediastinal retroperitoneal nodes along with large central mesenteric nodal mass consistent with lymphoma.  SUV in these areas ranging around SUV 16.  Central mesenteric nodal mass extending into the upper pelvis.  Patient had core biopsy of the mesenteric nodal mass  Interval history-he reports early satiety and intermittent diffuse abdominal pain.  He is still active and has a full-time job and also works out every day.  ECOG PS- 0 Pain scale- 3   Review of systems- Review of Systems  Constitutional:  Negative for chills, fever, malaise/fatigue and weight loss.  HENT:  Negative for congestion, ear discharge and nosebleeds.   Eyes:  Negative for blurred vision.  Respiratory:  Negative for cough, hemoptysis, sputum  production, shortness of breath and wheezing.   Cardiovascular:  Negative for chest pain, palpitations, orthopnea and claudication.  Gastrointestinal:  Positive for abdominal pain. Negative for blood in stool, constipation, diarrhea, heartburn, melena, nausea and vomiting.  Genitourinary:  Negative for dysuria, flank pain, frequency, hematuria and urgency.  Musculoskeletal:  Negative for back pain, joint pain and myalgias.  Skin:  Negative for rash.  Neurological:  Negative for dizziness, tingling, focal weakness, seizures, weakness and headaches.  Endo/Heme/Allergies:  Does not bruise/bleed easily.  Psychiatric/Behavioral:  Negative for depression and suicidal ideas. The patient does not have insomnia.       No Known Allergies   History reviewed. No pertinent past medical history.   Past Surgical History:  Procedure Laterality Date   FISTULOTOMY N/A 01/10/2018   Procedure: FISTULOTOMY;  Surgeon: Carolan Shiver, MD;  Location: [facility] ORS;  Service: General;  Laterality: N/A;   MASS BIOPSY Left 12/28/2023   Procedure: NECK MASS BIOPSY;  Surgeon: Leafy Ro, MD;  Location: [facility] ORS;  Service: General;  Laterality: Left;   PORTACATH PLACEMENT N/A 12/28/2023   Procedure: INSERTION PORT-A-CATH;  Surgeon: Leafy Ro, MD;  Location: [facility] ORS;  Service: General;  Laterality: N/A;    Social History   Socioeconomic History   Marital status: Single    Spouse name: Not on file   Number of children: 4   Years of education: Not on file   Highest education level: Not on file  Occupational History   Not on file  Tobacco Use   Smoking status: Former    Types: Cigarettes    Passive exposure: Past   Smokeless tobacco: Never  Vaping  Use   Vaping status: Never Used  Substance and Sexual Activity   Alcohol use: Not Currently   Drug use: Not Currently    Types: Cocaine    Comment: years ago    Sexual activity: Yes  Other Topics Concern   Not on file  Social History Narrative    Not on file   Social Drivers of Health   Financial Resource Strain: Not on file  Food Insecurity: No Food Insecurity (12/27/2023)   Hunger Vital Sign    Worried About Running Out of Food in the Last Year: Never true    Ran Out of Food in the Last Year: Never true  Transportation Needs: No Transportation Needs (12/27/2023)   PRAPARE - Administrator, Civil Service (Medical): No    Lack of Transportation (Non-Medical): No  Physical Activity: Inactive (12/27/2023)   Exercise Vital Sign    Days of Exercise per Week: 0 days    Minutes of Exercise per Session: 0 min  Stress: Not on file  Social Connections: Not on file  Intimate Partner Violence: Not At Risk (12/27/2023)   Humiliation, Afraid, Rape, and Kick questionnaire    Fear of Current or Ex-Partner: No    Emotionally Abused: No    Physically Abused: No    Sexually Abused: No    Family History  Problem Relation Age of Onset   Colon cancer Mother    Pancreatic cancer Father    Cancer Sister      Current Outpatient Medications:    acyclovir (ZOVIRAX) 400 MG tablet, Take 1 tablet (400 mg total) by mouth daily., Disp: 30 tablet, Rfl: 3   allopurinol (ZYLOPRIM) 300 MG tablet, Take 1 tablet (300 mg total) by mouth daily., Disp: 30 tablet, Rfl: 3   dexamethasone (DECADRON) 4 MG tablet, Take 2 tablets (8 mg total) by mouth daily. Start the day after bendamustine chemotherapy for 2 days. Take with food., Disp: 30 tablet, Rfl: 1   HYDROcodone-acetaminophen (NORCO/VICODIN) 5-325 MG tablet, Take 1 tablet by mouth every 6 (six) hours as needed for moderate pain (pain score 4-6)., Disp: 15 tablet, Rfl: 0   lidocaine-prilocaine (EMLA) cream, Apply to affected area once, Disp: 30 g, Rfl: 3   ondansetron (ZOFRAN) 8 MG tablet, Take 1 tablet (8 mg total) by mouth every 8 (eight) hours as needed for nausea or vomiting. Start on the third day after chemotherapy., Disp: 30 tablet, Rfl: 1   predniSONE (DELTASONE) 20 MG tablet, Take 5  tablets (100 mg total) by mouth daily with breakfast., Disp: 25 tablet, Rfl: 0   prochlorperazine (COMPAZINE) 10 MG tablet, Take 1 tablet (10 mg total) by mouth every 6 (six) hours as needed for nausea or vomiting., Disp: 30 tablet, Rfl: 1  Physical exam: There were no vitals filed for this visit. Physical Exam Cardiovascular:     Rate and Rhythm: Normal rate and regular rhythm.     Heart sounds: Normal heart sounds.  Pulmonary:     Effort: Pulmonary effort is normal.  Skin:    General: Skin is warm and dry.  Neurological:     Mental Status: He is alert and oriented to person, place, and time.         Latest Ref Rng & Units 12/27/2023   12:31 PM  CMP  Glucose 70 - 99 mg/dL 93   BUN 8 - 23 mg/dL 12   Creatinine 4.09 - 1.24 mg/dL 8.11   Sodium 914 - 782 mmol/L 137  Potassium 3.5 - 5.1 mmol/L 4.5   Chloride 98 - 111 mmol/L 106   CO2 22 - 32 mmol/L 25   Calcium 8.9 - 10.3 mg/dL 8.5   Total Protein 6.5 - 8.1 g/dL 6.1   Total Bilirubin 0.0 - 1.2 mg/dL 0.5   Alkaline Phos 38 - 126 U/L 69   AST 15 - 41 U/L 18   ALT 0 - 44 U/L 12       Latest Ref Rng & Units 12/27/2023   12:30 PM  CBC  WBC 4.0 - 10.5 K/uL 7.6   Hemoglobin 13.0 - 17.0 g/dL 52.8   Hematocrit 41.3 - 52.0 % 39.3   Platelets 150 - 400 K/uL 365       DG Chest Port 1 View Result Date: 12/28/2023 CLINICAL DATA:  Central line placement. EXAM: PORTABLE CHEST 1 VIEW COMPARISON:  PET-CT 12/22/2023. FINDINGS: 1326 hours. Lordotic positioning. Left IJ Port-A-Cath projects to the level of the mid right atrium. The heart size and mediastinal contours are stable allowing for the lordotic positioning. The lungs appear clear. There is no pleural effusion or pneumothorax. Surgical clips in the lower left neck. IMPRESSION: Left IJ Port-A-Cath projects to the level of the mid right atrium. No evidence of pneumothorax. Electronically Signed   By: Carey Bullocks M.D.   On: 12/28/2023 14:16   DG C-Arm 1-60 Min-No Report Result Date:  12/28/2023 Fluoroscopy was utilized by the requesting physician.  No radiographic interpretation.   NM PET Image Initial (PI) Skull Base To Thigh Result Date: 12/22/2023 CLINICAL DATA:  Initial treatment strategy for abdominal lymphadenopathy. EXAM: NUCLEAR MEDICINE PET SKULL BASE TO THIGH TECHNIQUE: 8.8 mCi F-18 FDG was injected intravenously. Full-ring PET imaging was performed from the skull base to thigh after the radiotracer. CT data was obtained and used for attenuation correction and anatomic localization. Fasting blood glucose: 74 mg/dl COMPARISON:  None Available. FINDINGS: Mediastinal blood pool activity: SUV max 1.1 Liver activity: SUV max 2.1 NECK: Intense hypermetabolic enlarged LEFT supraclavicular lymph nodes. Example node measures 2.3 cm on image 30 with SUV max equal 13.8. Incidental CT findings: CHEST: Several small hypermetabolic lymph nodes along the esophagus extending to the retrocrural space. Enlarged node in the retrocrural space measuring 14 mm (image 71) with SUV max equal 10.1. No suspicious pulmonary nodules Incidental CT findings: None. ABDOMEN/PELVIS: Large mass hypermetabolic tissue draped over the aorta and IVC and extending into the central small bowel mesentery of the mid LEFT abdomen. Mesenteric mass measures 12.8 x 7.8 cm on image 100 with SUV max equal 16.4. Nodal hypermetabolic tissue draped over the aorta obscures the planes aroung the aorta and is intensely metabolic with SUV max equal 16.5. Hypermetabolic central mesenteric nodal metastasis extend into the upper pelvis. Several hypermetabolic bilateral iliac nodes. Hypermetabolic peritoneal tissue along the deep peritoneal reflection into the posterior cul-de-sac. Incidental CT findings: Mild RIGHT hydronephrosis related to the pelvic mass. The RIGHT ureter is obstructed at the level of the RIGHT psoas muscle. SKELETON: No focal hypermetabolic activity to suggest skeletal metastasis. Incidental CT findings: None.  IMPRESSION: 1. Intensely hypermetabolic LEFT supraclavicular, mediastinal, and retroperitoneal nodes consistent high-grade lymphoma. 2. Large central mesenteric nodal mass also consistent with high-grade lymphoma. 3. Normal spleen and bone marrow. 4. RIGHT hydronephrosis and hydroureter related to RIGHT ureteral obstruction Electronically Signed   By: Genevive Bi M.D.   On: 12/22/2023 16:26   CT ABDOMINAL MASS BIOPSY Result Date: 12/21/2023 CLINICAL DATA:  Large mesenteric abdominal mass and retroperitoneal lymphadenopathy.  EXAM: CT GUIDED CORE BIOPSY OF MESENTERIC ABDOMINAL MASS ANESTHESIA/SEDATION: Moderate (conscious) sedation was employed during this procedure. A total of Versed 2.0 mg and Fentanyl 100 mcg was administered intravenously. Moderate Sedation Time: 17 minutes. The patient's level of consciousness and vital signs were monitored continuously by radiology nursing throughout the procedure under my direct supervision. PROCEDURE: The procedure risks, benefits, and alternatives were explained to the patient. Questions regarding the procedure were encouraged and answered. The patient understands and consents to the procedure. A time-out was performed prior to initiating the procedure. The left anterior abdominal wall was prepped with chlorhexidine in a sterile fashion, and a sterile drape was applied covering the operative field. A sterile gown and sterile gloves were used for the procedure. Local anesthesia was provided with 1% Lidocaine. CT was performed in a supine position to localize a mesenteric abdominal mass. Under CT guidance, a 17 gauge trocar needle was advanced into the mass. Five separate coaxial 18 gauge core biopsy samples were obtained and submitted on saline soaked Telfa gauze. Prior to outer needle removal, a slurry of Gel-Foam EmboCubes was injected through the outer needle as the needle was retracted and removed. Additional CT was performed. RADIATION DOSE REDUCTION: This exam  was performed according to the departmental dose-optimization program which includes automated exposure control, adjustment of the mA and/or kV according to patient size and/or use of iterative reconstruction technique. COMPLICATIONS: None FINDINGS: Solid tissue was obtained from sampling the dominant large anterior mesenteric abdominal mass located just to the left of midline. IMPRESSION: CT-guided biopsy of large mesenteric abdominal mass. Electronically Signed   By: Irish Lack M.D.   On: 12/21/2023 13:15   CT ABDOMEN PELVIS W CONTRAST Result Date: 12/15/2023 CLINICAL DATA:  Nausea and bloating. Mass lesion. * Tracking Code: BO * EXAM: CT ABDOMEN AND PELVIS WITH CONTRAST TECHNIQUE: Multidetector CT imaging of the abdomen and pelvis was performed using the standard protocol following bolus administration of intravenous contrast. RADIATION DOSE REDUCTION: This exam was performed according to the departmental dose-optimization program which includes automated exposure control, adjustment of the mA and/or kV according to patient size and/or use of iterative reconstruction technique. CONTRAST:  80mL OMNIPAQUE IOHEXOL 300 MG/ML  SOLN COMPARISON:  CT 2008 FINDINGS: Lower chest: Breathing motion lung bases. There is some linear opacity at the bases likely scar or atelectasis. Mild ground-glass. No pleural effusion. Hepatobiliary: Gallbladder is mildly distended. No intrahepatic biliary ductal dilatation. Pancreas: No intrinsic pancreatic mass Spleen: Spleen is enlarged with a cephalocaudal length of 14.8 cm. Preserved enhancement. Adrenals/Urinary Tract: Adrenal glands are preserved. No enhancing renal mass. There is mild right-sided renal collecting system dilatation. The course of the ureter in the mid retroperitoneum becomes encased by retroperitoneal mass and may be contributing to the collecting system dilatation. No left-sided renal collecting system dilatation. The left ureter has a normal course and  caliber down to the bladder. Preserved contours of the underdistended urinary bladder. Stomach/Bowel: Large bowel has a normal course and caliber scattered colonic stool. Left-sided colonic diverticula. Stomach is distended with fluid. The proximal duodenal is distended with fluid. The third portion is encased by soft tissue mass. The small bowel more distally is nondilated. There are several areas of small bowel which are poorly defined with the extensive mesenteric mass. Vascular/Lymphatic: Normal caliber aorta with scattered vascular calcifications along the aorta and branch vessels. The IVC is displaced anterior by a retroperitoneal mass and is somewhat narrowed and encased. Lumen is patent. There is also encasement of the  renal vasculature, mesenteric vasculature. There is large confluence central mesenteric mass again encasing vessels. In the axial plane on series 3, image 51 for example of the mesenteric lesion measures 21.8 x 8.9 cm. Cephalocaudal extent of the lesion which is confluence approaches 20.7 cm. There is also significant components retroperitoneal as stated above. The mass lesion for example encasing the retroperitoneal vessels and structures on image 36 of series 3 would measured 12.2 x 7.3 cm. Separate retrocrural lesion on the right measuring 2.9 by 1.6 cm. Reproductive: Prostate is unremarkable. Other: Mild ascites. Scattered mesenteric haziness. Few varices identified in the mesentery Musculoskeletal: Scattered degenerative changes of the spine and pelvis. Critical Value/emergent results were called by telephone at the time of interpretation on 12/15/2023 at 15:30 pm to provider PA Cruz Condon, who verbally acknowledged these results. IMPRESSION: Large soft tissue mass along the mesentery measuring up to 21 cm. There is encasement of central mesenteric vessels as well as loss of tissue planes between mass and several bowel loops. There is also a caliber change noted along the bowel  involving the third portion of the duodenum. A very mild partial obstruction would be in the differential associated with this mass. Please correlate with any specific symptoms of slow gastric emptying. There are also components of mass along the retroperitoneum with encasement of retroperitoneal structures and narrowing of vasculature with some varices. Overall appearance would be worrisome for malignancy such as lymphoma. The spleen is slightly enlarged without intrinsic splenic mass. Mild ascites. Colonic diverticula. Electronically Signed   By: Karen Kays M.D.   On: 12/15/2023 15:38     Assessment and plan- Patient is a 63 y.o. male with a large mesenteric nodal mass here to discuss biopsy results and further management  I have reviewed PET CT scan images independently and discussed findings with the patient which shows Diffuse adenopathy involving left supraclavicular as well as lymph nodes along the retrocrural and para-esophageal region.  He has a large mesenteric mass measuring 12.8 cm tripping over aorta and IVC and this extends into the upper pelvis.  Hypermetabolic bilateral iliac nodes.  Hypermetabolic peritoneal tissue along the deep peritoneal reflection into the posterior cul-de-sac.  Mesenteric nodal mass was biopsied and was consistent with grade 2 follicular lymphoma although higher grade could not be excluded.  Given that the SUV and these nodal masses are ranging around 16 there remains a concern for a diffuse large B-cell transformation.  I have therefore recommended that he should get an excisional left supraclavicular lymph node biopsy.  I have discussed this case with Dr. Everlene Farrier who will be seeing the patient later today and plan for excisional lymph node biopsy tomorrow.  I have tentatively scheduled to start him on Bendamustine Rituxan chemotherapy in 3 days time assuming that the excisional lymph node biopsy also shows follicular lymphoma.  If excisional biopsy shows diffuse large  B-cell lymphoma I will switch him to [facility] regimen.  It is imperative that I start treatment next week given that he has some concern for bowel obstruction noted on CT abdomen although clinically he is able to regular bowel movements presently.  I am checking baseline LDH as well as hepatitis testing today.  We will plan for port placement along with excisional supraclavicular lymph node biopsy.  Given that he has peritoneal involvement as well as possible involvement of the third portion of duodenum this would constitute stage IV disease.  There was no overt bone marrow involvement seen on PET scan and  I am not doing a bone marrow biopsy at this time.  For stage IV follicular lymphoma with organ obstruction he meets criteria for treatment and I would recommend 6 cycles of Bendamustine and Rituxan.  Bendamustine will be given IV on day 1 and day 2 and Rituxan will be given on day 1 and this will be done q. 28 days.  Discussed risks and benefits of this treatment regimen including all but not limited to nausea vomiting low blood counts risk of infections and hospitalizations.  Risk of peripheral neuropathy associated with Bendamustine and infusion reaction associated with Rituxan.  Patient understands and agrees to proceed as planned.  I would like him to take 100 mg of prednisone for 5 days starting 1 day after his excisional lymph node biopsy in preparation to start chemotherapy.   Cancer Staging  Follicular lymphoma grade II of intra-abdominal lymph nodes (HCC) Staging form: Hodgkin and Non-Hodgkin Lymphoma, AJCC 8th Edition - Clinical stage from 12/27/2023: Stage IV (Follicular lymphoma) - Signed by Creig Hines, MD on 12/27/2023 Stage prefix: Initial diagnosis     Visit Diagnosis 1. Follicular lymphoma grade II of intra-abdominal lymph nodes (HCC)   2. Goals of care, counseling/discussion   3. Diffuse large B-cell lymphoma of intra-abdominal lymph nodes (HCC)      Dr. Owens Shark, MD,  [facility] [phone] 12/30/2023 6:50 PM

## 2024-01-01 ENCOUNTER — Other Ambulatory Visit: Payer: Self-pay

## 2024-01-01 ENCOUNTER — Encounter: Payer: Self-pay | Admitting: Oncology

## 2024-01-01 ENCOUNTER — Inpatient Hospital Stay: Payer: 59 | Attending: Oncology | Admitting: Oncology

## 2024-01-01 ENCOUNTER — Other Ambulatory Visit: Payer: Self-pay | Admitting: Oncology

## 2024-01-01 VITALS — BP 124/79 | HR 88 | Temp 98.2°F | Resp 16 | Ht 64.0 in | Wt 171.0 lb

## 2024-01-01 DIAGNOSIS — C8333 Diffuse large B-cell lymphoma, intra-abdominal lymph nodes: Secondary | ICD-10-CM | POA: Diagnosis not present

## 2024-01-01 DIAGNOSIS — D709 Neutropenia, unspecified: Secondary | ICD-10-CM | POA: Diagnosis not present

## 2024-01-01 DIAGNOSIS — Z809 Family history of malignant neoplasm, unspecified: Secondary | ICD-10-CM | POA: Diagnosis not present

## 2024-01-01 DIAGNOSIS — Z79630 Long term (current) use of alkylating agent: Secondary | ICD-10-CM | POA: Insufficient documentation

## 2024-01-01 DIAGNOSIS — R5081 Fever presenting with conditions classified elsewhere: Secondary | ICD-10-CM | POA: Insufficient documentation

## 2024-01-01 DIAGNOSIS — Z79632 Long term (current) use of antitumor antibiotic: Secondary | ICD-10-CM | POA: Insufficient documentation

## 2024-01-01 DIAGNOSIS — Z87891 Personal history of nicotine dependence: Secondary | ICD-10-CM | POA: Insufficient documentation

## 2024-01-01 DIAGNOSIS — G629 Polyneuropathy, unspecified: Secondary | ICD-10-CM | POA: Diagnosis not present

## 2024-01-01 DIAGNOSIS — Z5111 Encounter for antineoplastic chemotherapy: Secondary | ICD-10-CM | POA: Insufficient documentation

## 2024-01-01 DIAGNOSIS — N1330 Unspecified hydronephrosis: Secondary | ICD-10-CM | POA: Insufficient documentation

## 2024-01-01 DIAGNOSIS — R7989 Other specified abnormal findings of blood chemistry: Secondary | ICD-10-CM | POA: Insufficient documentation

## 2024-01-01 DIAGNOSIS — C8213 Follicular lymphoma grade II, intra-abdominal lymph nodes: Secondary | ICD-10-CM

## 2024-01-01 DIAGNOSIS — K828 Other specified diseases of gallbladder: Secondary | ICD-10-CM | POA: Diagnosis not present

## 2024-01-01 DIAGNOSIS — Z7189 Other specified counseling: Secondary | ICD-10-CM | POA: Diagnosis not present

## 2024-01-01 DIAGNOSIS — R188 Other ascites: Secondary | ICD-10-CM | POA: Diagnosis not present

## 2024-01-01 DIAGNOSIS — R6881 Early satiety: Secondary | ICD-10-CM | POA: Insufficient documentation

## 2024-01-01 DIAGNOSIS — Z79899 Other long term (current) drug therapy: Secondary | ICD-10-CM | POA: Diagnosis not present

## 2024-01-01 DIAGNOSIS — K5730 Diverticulosis of large intestine without perforation or abscess without bleeding: Secondary | ICD-10-CM | POA: Diagnosis not present

## 2024-01-01 DIAGNOSIS — K56699 Other intestinal obstruction unspecified as to partial versus complete obstruction: Secondary | ICD-10-CM | POA: Diagnosis not present

## 2024-01-01 DIAGNOSIS — Z79620 Long term (current) use of immunosuppressive biologic: Secondary | ICD-10-CM | POA: Diagnosis not present

## 2024-01-01 DIAGNOSIS — Z800 Family history of malignant neoplasm of digestive organs: Secondary | ICD-10-CM | POA: Diagnosis not present

## 2024-01-01 DIAGNOSIS — Z5112 Encounter for antineoplastic immunotherapy: Secondary | ICD-10-CM | POA: Diagnosis present

## 2024-01-01 MED ORDER — ALLOPURINOL 300 MG PO TABS: 300.0000 mg | ORAL_TABLET | Freq: Every day | ORAL | 3 refills | Status: DC

## 2024-01-02 ENCOUNTER — Encounter: Payer: Self-pay | Admitting: Oncology

## 2024-01-02 ENCOUNTER — Ambulatory Visit
Admission: RE | Admit: 2024-01-02 | Discharge: 2024-01-02 | Disposition: A | Discharge status: Home / Self Care | Payer: 59 | Source: Ambulatory Visit | Attending: Oncology | Admitting: Oncology

## 2024-01-02 ENCOUNTER — Other Ambulatory Visit: Payer: Self-pay

## 2024-01-02 DIAGNOSIS — Z7189 Other specified counseling: Secondary | ICD-10-CM | POA: Insufficient documentation

## 2024-01-02 DIAGNOSIS — C8213 Follicular lymphoma grade II, intra-abdominal lymph nodes: Secondary | ICD-10-CM | POA: Diagnosis present

## 2024-01-02 MED ORDER — TECHNETIUM TC 99M-LABELED RED BLOOD CELLS IV KIT
20.0000 | PACK | Freq: Once | INTRAVENOUS | Status: AC | PRN
  Administered 2024-01-02: 20.2 via INTRAVENOUS

## 2024-01-02 MED FILL — Fosaprepitant Dimeglumine For IV Infusion 150 MG (Base Eq): INTRAVENOUS | Qty: 5 | Status: AC

## 2024-01-03 ENCOUNTER — Inpatient Hospital Stay: Payer: 59 | Admitting: Oncology

## 2024-01-03 ENCOUNTER — Inpatient Hospital Stay: Payer: 59

## 2024-01-03 VITALS — BP 114/70 | HR 85 | Temp 96.0°F | Resp 17

## 2024-01-03 DIAGNOSIS — C8333 Diffuse large B-cell lymphoma, intra-abdominal lymph nodes: Secondary | ICD-10-CM

## 2024-01-03 DIAGNOSIS — Z5112 Encounter for antineoplastic immunotherapy: Secondary | ICD-10-CM | POA: Diagnosis not present

## 2024-01-03 LAB — CBC WITH DIFFERENTIAL (CANCER CENTER ONLY)
Abs Immature Granulocytes: 0.09 10*3/uL — ABNORMAL HIGH (ref 0.00–0.07)
Basophils Absolute: 0 10*3/uL (ref 0.0–0.1)
Basophils Relative: 0 %
Eosinophils Absolute: 0 10*3/uL (ref 0.0–0.5)
Eosinophils Relative: 0 %
HCT: 36.8 % — ABNORMAL LOW (ref 39.0–52.0)
Hemoglobin: 12.6 g/dL — ABNORMAL LOW (ref 13.0–17.0)
Immature Granulocytes: 1 %
Lymphocytes Relative: 14 %
Lymphs Abs: 1.4 10*3/uL (ref 0.7–4.0)
MCH: 29 pg (ref 26.0–34.0)
MCHC: 34.2 g/dL (ref 30.0–36.0)
MCV: 84.6 fL (ref 80.0–100.0)
Monocytes Absolute: 0.9 10*3/uL (ref 0.1–1.0)
Monocytes Relative: 9 %
Neutro Abs: 7.8 10*3/uL — ABNORMAL HIGH (ref 1.7–7.7)
Neutrophils Relative %: 76 %
Platelet Count: 264 10*3/uL (ref 150–400)
RBC: 4.35 MIL/uL (ref 4.22–5.81)
RDW: 12.6 % (ref 11.5–15.5)
WBC Count: 10.3 10*3/uL (ref 4.0–10.5)
nRBC: 0 % (ref 0.0–0.2)

## 2024-01-03 LAB — CMP (CANCER CENTER ONLY)
ALT: 19 U/L (ref 0–44)
AST: 19 U/L (ref 15–41)
Albumin: 3.4 g/dL — ABNORMAL LOW (ref 3.5–5.0)
Alkaline Phosphatase: 54 U/L (ref 38–126)
Anion gap: 9 (ref 5–15)
BUN: 14 mg/dL (ref 8–23)
CO2: 23 mmol/L (ref 22–32)
Calcium: 8 mg/dL — ABNORMAL LOW (ref 8.9–10.3)
Chloride: 106 mmol/L (ref 98–111)
Creatinine: 0.77 mg/dL (ref 0.61–1.24)
GFR, Estimated: >60 mL/min (ref >60)
Glucose, Bld: 117 mg/dL — ABNORMAL HIGH (ref 70–99)
Potassium: 3.6 mmol/L (ref 3.5–5.1)
Sodium: 138 mmol/L (ref 135–145)
Total Bilirubin: 0.5 mg/dL (ref 0.0–1.2)
Total Protein: 5.7 g/dL — ABNORMAL LOW (ref 6.5–8.1)

## 2024-01-03 MED ORDER — HEPARIN SOD (PORK) LOCK FLUSH 100 UNIT/ML IV SOLN
500.0000 [IU] | Freq: Once | INTRAVENOUS | Status: AC | PRN
  Administered 2024-01-03: 500 [IU]
  Filled 2024-01-03: qty 5

## 2024-01-03 MED ORDER — SODIUM CHLORIDE 0.9 % IV SOLN
750.0000 mg/m2 | Freq: Once | INTRAVENOUS | Status: AC
  Administered 2024-01-03: 1500 mg via INTRAVENOUS
  Filled 2024-01-03: qty 75

## 2024-01-03 MED ORDER — PALONOSETRON HCL INJECTION 0.25 MG/5ML
0.2500 mg | Freq: Once | INTRAVENOUS | Status: AC
  Administered 2024-01-03: 0.25 mg via INTRAVENOUS

## 2024-01-03 MED ORDER — SODIUM CHLORIDE 0.9 % IV SOLN
INTRAVENOUS | Status: DC
  Filled 2024-01-03: qty 250

## 2024-01-03 MED ORDER — SODIUM CHLORIDE 0.9 % IV SOLN
150.0000 mg | Freq: Once | INTRAVENOUS | Status: AC
  Administered 2024-01-03: 150 mg via INTRAVENOUS
  Filled 2024-01-03: qty 150

## 2024-01-03 MED ORDER — ACETAMINOPHEN 325 MG PO TABS
650.0000 mg | ORAL_TABLET | Freq: Once | ORAL | Status: AC
  Administered 2024-01-03: 650 mg via ORAL

## 2024-01-03 MED ORDER — SODIUM CHLORIDE 0.9 % IV SOLN
375.0000 mg/m2 | Freq: Once | INTRAVENOUS | Status: AC
  Administered 2024-01-03: 700 mg via INTRAVENOUS
  Filled 2024-01-03: qty 50

## 2024-01-03 MED ORDER — DOXORUBICIN HCL CHEMO IV INJECTION 2 MG/ML
50.0000 mg/m2 | Freq: Once | INTRAVENOUS | Status: AC
  Administered 2024-01-03: 94 mg via INTRAVENOUS
  Filled 2024-01-03: qty 47

## 2024-01-03 MED ORDER — DIPHENHYDRAMINE HCL 25 MG PO CAPS
50.0000 mg | ORAL_CAPSULE | Freq: Once | ORAL | Status: AC
  Administered 2024-01-03: 50 mg via ORAL

## 2024-01-04 ENCOUNTER — Inpatient Hospital Stay: Payer: 59

## 2024-01-04 ENCOUNTER — Telehealth: Payer: Self-pay

## 2024-01-04 ENCOUNTER — Ambulatory Visit: Payer: 59

## 2024-01-04 VITALS — BP 122/74 | HR 100 | Temp 97.2°F | Resp 19

## 2024-01-04 DIAGNOSIS — C8333 Diffuse large B-cell lymphoma, intra-abdominal lymph nodes: Secondary | ICD-10-CM

## 2024-01-04 DIAGNOSIS — Z5112 Encounter for antineoplastic immunotherapy: Secondary | ICD-10-CM | POA: Diagnosis not present

## 2024-01-04 MED ORDER — DIPHENHYDRAMINE HCL 25 MG PO CAPS
50.0000 mg | ORAL_CAPSULE | Freq: Once | ORAL | Status: AC
  Administered 2024-01-04: 50 mg via ORAL

## 2024-01-04 MED ORDER — SODIUM CHLORIDE 0.9 % IV SOLN
INTRAVENOUS | Status: DC
  Filled 2024-01-04: qty 250

## 2024-01-04 MED ORDER — HEPARIN SOD (PORK) LOCK FLUSH 100 UNIT/ML IV SOLN
500.0000 [IU] | Freq: Once | INTRAVENOUS | Status: AC | PRN
  Administered 2024-01-04: 500 [IU]
  Filled 2024-01-04: qty 5

## 2024-01-04 MED ORDER — SODIUM CHLORIDE 0.9 % IV SOLN
1.7700 mg/kg | Freq: Once | INTRAVENOUS | Status: AC
  Administered 2024-01-04: 140 mg via INTRAVENOUS
  Filled 2024-01-04: qty 7

## 2024-01-04 MED ORDER — ACETAMINOPHEN 325 MG PO TABS
650.0000 mg | ORAL_TABLET | Freq: Once | ORAL | Status: AC
Start: 2024-01-04 — End: 2024-01-04
  Administered 2024-01-04: 650 mg via ORAL

## 2024-01-04 NOTE — Telephone Encounter (Signed)
Telephone call to patient daughter for follow up after receiving first infusion.   Patient daughter states infusion went great.  States eating good and drinking plenty of fluids.   Denies any nausea or vomiting.  Encouraged patient daughter to call for any concerns or questions.

## 2024-01-05 ENCOUNTER — Inpatient Hospital Stay: Payer: 59

## 2024-01-05 VITALS — BP 123/70 | HR 90 | Temp 98.0°F | Resp 18

## 2024-01-05 DIAGNOSIS — C8333 Diffuse large B-cell lymphoma, intra-abdominal lymph nodes: Secondary | ICD-10-CM

## 2024-01-05 DIAGNOSIS — Z5112 Encounter for antineoplastic immunotherapy: Secondary | ICD-10-CM | POA: Diagnosis not present

## 2024-01-05 MED ORDER — PEGFILGRASTIM-CBQV 6 MG/0.6ML ~~LOC~~ SOSY
6.0000 mg | PREFILLED_SYRINGE | Freq: Once | SUBCUTANEOUS | Status: AC
  Administered 2024-01-05: 6 mg via SUBCUTANEOUS
  Filled 2024-01-05: qty 0.6

## 2024-01-05 NOTE — Patient Instructions (Signed)
 Pegfilgrastim Injection Qu es este medicamento? El PEGFILGRASTIM disminuye el riesgo de infeccin en personas que estn recibiendo quimioterapia. Acta ayudando al cuerpo a producir ms glbulos blancos, que protegen al cuerpo contra las infecciones. Tambin podra usarse para ayudar a las personas que han estado expuestas a dosis elevadas de radiacin. Este medicamento puede ser utilizado para otros usos; si tiene alguna pregunta consulte con su proveedor de atencin mdica o con su Social research officer, government. MARCAS COMUNES: Fulphila, [place], Neulasta, Nyvepria, Stimufend, UDENYCA, UDENYCA ONBODY, Ziextenzo Qu le debo informar a mi profesional de la salud antes de tomar este medicamento? Necesitan saber si usted presenta alguno de los siguientes problemas o situaciones: Enfermedad renal Alergia al ltex Terapia de radiacin en curso Enfermedad de clulas falciformes Reacciones en la piel a los [employer] acrlicos (inyector en el cuerpo solamente) Una reaccin alrgica o inusual al pegfilgrastim, al filgrastim, a otros medicamentos, alimentos, colorantes o conservantes Si est embarazada o buscando quedar embarazada Si est amamantando a un beb Cmo debo [employer]? Este medicamento se inyecta debajo de la piel. Si recibe [employer] en su casa, le ensearn cmo preparar y Building services engineer la jeringa prellenada o cmo usar el inyector en el cuerpo. Consulte las instrucciones de uso para el paciente para obtener instrucciones detalladas. Use el medicamento exactamente como se le indique. Informe de inmediato a su equipo de atencin si sospecha que su inyector en el cuerpo puede haber funcionado incorrectamente o si sospecha que el uso de su inyector en el cuerpo hizo que usted recibiera una dosis parcial o no recibiera una dosis. Es importante que deseche las agujas y las jeringas usadas en un recipiente resistente a los pinchazos. No las deseche en la basura. Si no tiene un recipiente  resistente a los pinchazos, llame a su farmacutico o a su equipo de atencin para obtenerlo. Hable con su equipo de atencin sobre el uso de este medicamento en nios. Aunque este medicamento se puede recetar para ciertas afecciones, existen precauciones que deben tomarse. Sobredosis: Pngase en contacto inmediatamente con un centro toxicolgico o una sala de urgencia si usted cree que haya tomado demasiado medicamento.<br>ATENCIN: [employer] es solo para usted. No comparta este medicamento con nadie. Qu sucede si me olvido de una dosis? Es importante no olvidar ninguna dosis. Llame a su equipo de atencin si olvid una dosis. Si olvida una dosis debido a Building control surveyor o fuga del Hydrographic surveyor en el cuerpo, se deber administrar una nueva dosis tan pronto como sea posible usando una nica jeringa prellenada para uso manual. Qu puede interactuar con este medicamento? No se han estudiado las interacciones. Puede ser que esta lista no menciona todas las posibles interacciones. Informe a su profesional de [employer] de [employer] productos a base de hierbas, medicamentos de [place] o suplementos nutritivos que est tomando. Si usted fuma, consume bebidas alcohlicas o si utiliza drogas ilegales, indqueselo tambin a su profesional de [employer]. Algunas sustancias pueden interactuar con su medicamento. A qu debo estar atento al usar [employer]? Se supervisar su estado de salud atentamente mientras reciba este medicamento. Usted podra necesitar realizarse [employer] de sangre mientras est usando [place]. Hable con su equipo de atencin sobre su riesgo de cncer. Usted podra tener mayor riesgo de desarrollar ciertos tipos de cncer si [place] este medicamento. Si va a someterse a una IRM (MRI), tomografa computarizada u otro procedimiento, informe a su equipo de atencin que est usando este medicamento (inyector en el cuerpo solamente). Qu efectos secundarios puedo  tener al [employer] este  medicamento? Efectos secundarios que debe informar a su equipo de atencin tan pronto como sea posible: Reacciones alrgicas: erupcin cutnea, comezn/picazn, urticaria, hinchazn de la cara, los labios, la lengua o la garganta [place] de fuga capilar: Theme park manager o muscular, debilidad o fatiga inusuales, sensacin de desmayo o aturdimiento, disminucin de la cantidad de [place], hinchazn de los tobillos, las manos o los pies, dificultad respiratoria Nivel elevado de glbulos blancos: fiebre, fatiga, dificultad respiratoria, sudoracin nocturna, cambio en la visin, prdida de peso Inflamacin de la aorta: fiebre, fatiga, dolor de espalda, pecho o estmago, dolor de cabeza intenso Lesin renal (glomerulonefritis): disminucin de la cantidad de [place], [place] roja o [place], orina con espuma o burbujas, hinchazn de los tobillos, las manos o los pies Falta de aire o problemas para respirar Lesin en el bazo: Engineer, mining en la parte superior izquierda del abdomen u hombro Sangrado o moretones inusuales Efectos secundarios que generalmente no requieren atencin mdica (debe informarlos a su equipo de atencin si persisten o si son molestos): Dolor de Database administrator en las manos o los pies Puede ser que esta lista no menciona todos los posibles efectos secundarios. Comunquese a su mdico por asesoramiento mdico [employer]. Usted puede informar los efectos secundarios a la FDA por telfono al 1-800-FDA-1088. Dnde debo guardar mi medicina? Mantenga fuera del alcance de los nios. Si est [employer] en su casa, le indicarn cmo guardarlo. Deseche todo el medicamento que no haya utilizado despus de la fecha de vencimiento indicada en la etiqueta. ATENCIN: Este folleto es un resumen. Puede ser que no cubra toda la posible informacin. Si usted tiene preguntas acerca de esta medicina, consulte con su mdico, su farmacutico o su profesional de Radiographer, therapeutic.  2024  Elsevier/Gold Standard (2022-12-20 00:00:00)

## 2024-01-08 ENCOUNTER — Encounter: Payer: Self-pay | Admitting: Oncology

## 2024-01-10 ENCOUNTER — Ambulatory Visit (INDEPENDENT_AMBULATORY_CARE_PROVIDER_SITE_OTHER): Payer: 59 | Admitting: Surgery

## 2024-01-10 ENCOUNTER — Encounter: Payer: Self-pay | Admitting: Surgery

## 2024-01-10 VITALS — BP 128/81 | HR 80 | Ht 64.0 in | Wt 157.0 lb

## 2024-01-10 DIAGNOSIS — Z09 Encounter for follow-up examination after completed treatment for conditions other than malignant neoplasm: Secondary | ICD-10-CM

## 2024-01-10 DIAGNOSIS — R221 Localized swelling, mass and lump, neck: Secondary | ICD-10-CM | POA: Diagnosis not present

## 2024-01-10 NOTE — Patient Instructions (Signed)
Follow-up with our office as needed.  Please call and ask to speak with a nurse if you develop questions or concerns.

## 2024-01-10 NOTE — Progress Notes (Signed)
William Mathews is following after excisional neck biopsy and port He is doing well no complaints.  Port is working well.  No fevers no chills  PE NAD Neck and chest wall with incisions healing well without infection.  No evidence of complications  A/P Doing very well w/o complications RTC prn

## 2024-01-11 ENCOUNTER — Ambulatory Visit: Payer: 59 | Admitting: Nurse Practitioner

## 2024-01-11 ENCOUNTER — Other Ambulatory Visit: Payer: 59

## 2024-01-11 ENCOUNTER — Ambulatory Visit: Payer: 59

## 2024-01-12 ENCOUNTER — Encounter: Payer: Self-pay | Admitting: Oncology

## 2024-01-12 ENCOUNTER — Inpatient Hospital Stay: Payer: 59

## 2024-01-12 ENCOUNTER — Inpatient Hospital Stay: Payer: 59 | Admitting: Oncology

## 2024-01-12 VITALS — BP 106/74 | HR 81 | Temp 96.2°F | Resp 18 | Wt 161.3 lb

## 2024-01-12 DIAGNOSIS — C8333 Diffuse large B-cell lymphoma, intra-abdominal lymph nodes: Secondary | ICD-10-CM

## 2024-01-12 DIAGNOSIS — Z5112 Encounter for antineoplastic immunotherapy: Secondary | ICD-10-CM | POA: Diagnosis not present

## 2024-01-12 LAB — CBC WITH DIFFERENTIAL/PLATELET
Abs Immature Granulocytes: 0.67 10*3/uL — ABNORMAL HIGH (ref 0.00–0.07)
Basophils Absolute: 0.1 10*3/uL (ref 0.0–0.1)
Basophils Relative: 1 %
Eosinophils Absolute: 0.1 10*3/uL (ref 0.0–0.5)
Eosinophils Relative: 1 %
HCT: 39.2 % (ref 39.0–52.0)
Hemoglobin: 13.1 g/dL (ref 13.0–17.0)
Immature Granulocytes: 11 %
Lymphocytes Relative: 20 %
Lymphs Abs: 1.3 10*3/uL (ref 0.7–4.0)
MCH: 28.5 pg (ref 26.0–34.0)
MCHC: 33.4 g/dL (ref 30.0–36.0)
MCV: 85.4 fL (ref 80.0–100.0)
Monocytes Absolute: 0.7 10*3/uL (ref 0.1–1.0)
Monocytes Relative: 11 %
Neutro Abs: 3.6 10*3/uL (ref 1.7–7.7)
Neutrophils Relative %: 56 %
Platelets: 168 10*3/uL (ref 150–400)
RBC: 4.59 MIL/uL (ref 4.22–5.81)
RDW: 12.3 % (ref 11.5–15.5)
Smear Review: NORMAL
WBC: 6.3 10*3/uL (ref 4.0–10.5)
nRBC: 0 % (ref 0.0–0.2)

## 2024-01-12 LAB — CMP (CANCER CENTER ONLY)
ALT: 17 U/L (ref 0–44)
AST: 20 U/L (ref 15–41)
Albumin: 3.6 g/dL (ref 3.5–5.0)
Alkaline Phosphatase: 86 U/L (ref 38–126)
Anion gap: 10 (ref 5–15)
BUN: 12 mg/dL (ref 8–23)
CO2: 21 mmol/L — ABNORMAL LOW (ref 22–32)
Calcium: 8.2 mg/dL — ABNORMAL LOW (ref 8.9–10.3)
Chloride: 105 mmol/L (ref 98–111)
Creatinine: 1.18 mg/dL (ref 0.61–1.24)
GFR, Estimated: >60 mL/min (ref >60)
Glucose, Bld: 124 mg/dL — ABNORMAL HIGH (ref 70–99)
Potassium: 4 mmol/L (ref 3.5–5.1)
Sodium: 136 mmol/L (ref 135–145)
Total Bilirubin: 0.3 mg/dL (ref 0.0–1.2)
Total Protein: 6.1 g/dL — ABNORMAL LOW (ref 6.5–8.1)

## 2024-01-12 MED ORDER — HEPARIN SOD (PORK) LOCK FLUSH 100 UNIT/ML IV SOLN
500.0000 [IU] | Freq: Once | INTRAVENOUS | Status: AC
  Administered 2024-01-12: 500 [IU] via INTRAVENOUS
  Filled 2024-01-12: qty 5

## 2024-01-12 NOTE — Progress Notes (Signed)
 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  He had a CT abdomen and pelvis with contrast which showed large soft tissue mass along the mesentery measuring up to 21 cm with encasement of central mesenteric vessels and loss of tissue planes between the mass and several bowel loops.  Caliber change along the third portion of duodenum with partial mild obstruction.  Findings concerning for lymphoma.  Serum creatinine mildly elevated at 1.4.  Spleen slightly enlarged without intrinsic splenic mass.  Patient denies any significant nausea or vomiting.  He has early satiety   PET CT scan from 12/22/2023 showed Hypermetabolic left supraclavicular mediastinal retroperitoneal nodes along with large central mesenteric nodal mass consistent with lymphoma.  SUV in these areas ranging around SUV 16.  Central mesenteric nodal mass extending into the upper pelvis.  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  Cells positive for CD20 with coexpression of CD10 and BCL6 consistent with germinal center/folic killer phenotype and aberrantly express Bcl-2 strongly.  MOM1 and CD30 are negative.  EBV ISH negative.  Proliferation ranges  from 20 to 80% with overall average of 60 to 70%.  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  Interval history-he tolerated chemotherapy well.  He had 1 or 2 episodes of nausea which were self-limited.  Symptoms of early satiety are improving.  He has lost 10 pounds since last 2 weeks.  But appetite is overall good  ECOG PS- 0 Pain scale- 0   Review of systems- Review of Systems  Constitutional:  Negative for chills, fever, malaise/fatigue and weight loss.  HENT:  Negative for congestion, ear discharge and nosebleeds.   Eyes:  Negative for blurred vision.  Respiratory:  Negative for cough, hemoptysis, sputum production, shortness of breath and wheezing.   Cardiovascular:  Negative for chest pain, palpitations, orthopnea and claudication.  Gastrointestinal:  Negative for abdominal pain, blood in stool, constipation, diarrhea, heartburn, melena, nausea and vomiting.  Genitourinary:  Negative for dysuria, flank pain, frequency, hematuria and urgency.  Musculoskeletal:  Negative for back pain, joint pain and myalgias.  Skin:  Negative for rash.  Neurological:  Negative for dizziness, tingling, focal weakness, seizures, weakness and headaches.  Endo/Heme/Allergies:  Does not bruise/bleed easily.  Psychiatric/Behavioral:  Negative for depression and suicidal ideas. The patient does not have insomnia.       No Known Allergies   History reviewed. No pertinent past medical history.   Past Surgical History:  Procedure Laterality Date   FISTULOTOMY N/A 01/10/2018   Procedure: FISTULOTOMY;  Surgeon: Carolan Shiver, MD;  Location: [facility] ORS;  Service: General;  Laterality: N/A;   MASS BIOPSY Left 12/28/2023   Procedure: NECK MASS BIOPSY;  Surgeon: Leafy Ro, MD;  Location: [facility] ORS;  Service:  General;  Laterality: Left;   PORTACATH PLACEMENT N/A 12/28/2023   Procedure: INSERTION PORT-A-CATH;  Surgeon: Leafy Ro, MD;  Location: [facility] ORS;  Service: General;  Laterality: N/A;    Social History   Socioeconomic History   Marital status: Single    Spouse name: Not on file   Number of children: 4   Years of education: Not on file   Highest education level: Not on file  Occupational History   Not on file  Tobacco Use   Smoking status: Former    Types: Cigarettes    Passive exposure: Past   Smokeless tobacco: Never  Vaping Use   Vaping status: Never Used  Substance and Sexual Activity   Alcohol use: Not Currently   Drug use: Not Currently    Types: Cocaine    Comment: years ago    Sexual activity: Yes  Other Topics Concern   Not on file  Social History Narrative   Not on file   Social Drivers of Health   Financial Resource Strain: Not on file  Food Insecurity: No Food Insecurity (12/27/2023)   Hunger Vital Sign    Worried About Running Out of Food in the Last Year: Never true    Ran Out of Food in the Last Year: Never true  Transportation Needs: No Transportation Needs (12/27/2023)   PRAPARE - Administrator, Civil Service (Medical): No    Lack of Transportation (Non-Medical): No  Physical Activity: Inactive (12/27/2023)   Exercise Vital Sign    Days of Exercise per Week: 0 days    Minutes of Exercise per Session: 0 min  Stress: Not on file  Social Connections: Not on file  Intimate Partner Violence: Not At Risk (12/27/2023)   Humiliation, Afraid, Rape, and Kick questionnaire    Fear of Current or Ex-Partner: No    Emotionally Abused: No    Physically Abused: No    Sexually Abused: No    Family History  Problem Relation Age of Onset   Colon cancer Mother    Pancreatic cancer Father    Cancer Sister      Current Outpatient Medications:    acyclovir (ZOVIRAX) 400 MG tablet, Take 1 tablet (400 mg total) by mouth 2 (two) times daily., Disp:  60 tablet, Rfl: 5   allopurinol (ZYLOPRIM) 300 MG tablet, Take 1 tablet (300 mg total) by mouth daily., Disp: 30 tablet, Rfl: 3   HYDROcodone-acetaminophen (NORCO/VICODIN) 5-325 MG tablet, Take 1 tablet by mouth every 6 (six) hours as needed for moderate pain (pain score 4-6)., Disp: 15 tablet, Rfl: 0  Physical exam:  Vitals:   01/12/24 0959  BP: 106/74  Pulse: 81  Resp: 18  Temp: (!) 96.2 F (35.7 C)  TempSrc: Tympanic  SpO2: 100%  Weight: 161 lb 4.8 oz (73.2 kg)   Physical Exam Cardiovascular:     Rate and Rhythm: Normal rate and regular rhythm.     Heart sounds: Normal heart sounds.  Pulmonary:     Effort: Pulmonary effort is normal.     Breath sounds: Normal breath sounds.  Abdominal:     General: Bowel sounds are normal.     Palpations: Abdomen is soft.  Skin:    General: Skin is warm and dry.  Neurological:     Mental Status: He is alert and oriented  to person, place, and time.         Latest Ref Rng & Units 01/12/2024    9:43 AM  CMP  Glucose 70 - 99 mg/dL 161   BUN 8 - 23 mg/dL 12   Creatinine 0.96 - 1.24 mg/dL 0.45   Sodium 409 - 811 mmol/L 136   Potassium 3.5 - 5.1 mmol/L 4.0   Chloride 98 - 111 mmol/L 105   CO2 22 - 32 mmol/L 21   Calcium 8.9 - 10.3 mg/dL 8.2   Total Protein 6.5 - 8.1 g/dL 6.1   Total Bilirubin 0.0 - 1.2 mg/dL 0.3   Alkaline Phos 38 - 126 U/L 86   AST 15 - 41 U/L 20   ALT 0 - 44 U/L 17       Latest Ref Rng & Units 01/12/2024    9:43 AM  CBC  WBC 4.0 - 10.5 K/uL 6.3   Hemoglobin 13.0 - 17.0 g/dL 91.4   Hematocrit 78.2 - 52.0 % 39.2   Platelets 150 - 400 K/uL 168     No images are attached to the encounter.  NM Cardiac Muga Rest Result Date: 01/02/2024 CLINICAL DATA:  Follicular lymphoma.  Assess cardiac function EXAM: NUCLEAR MEDICINE CARDIAC BLOOD POOL IMAGING (MUGA) TECHNIQUE: Cardiac multi-gated acquisition was performed at rest following intravenous injection of Tc-67m labeled red blood cells. RADIOPHARMACEUTICALS:  20.2 mCi  Tc-95m pertechnetate in-vitro labeled red blood cells IV COMPARISON:  None Available. FINDINGS: No  focal wall motion abnormality of the left ventricle. Calculated left ventricular ejection fraction equals 53.2% IMPRESSION: Left ventricular ejection fraction equals53.2 %. Electronically Signed   By: Genevive Bi M.D.   On: 01/02/2024 16:11   DG Chest Port 1 View Result Date: 12/28/2023 CLINICAL DATA:  Central line placement. EXAM: PORTABLE CHEST 1 VIEW COMPARISON:  PET-CT 12/22/2023. FINDINGS: 1326 hours. Lordotic positioning. Left IJ Port-A-Cath projects to the level of the mid right atrium. The heart size and mediastinal contours are stable allowing for the lordotic positioning. The lungs appear clear. There is no pleural effusion or pneumothorax. Surgical clips in the lower left neck. IMPRESSION: Left IJ Port-A-Cath projects to the level of the mid right atrium. No evidence of pneumothorax. Electronically Signed   By: Carey Bullocks M.D.   On: 12/28/2023 14:16   DG C-Arm 1-60 Min-No Report Result Date: 12/28/2023 Fluoroscopy was utilized by the requesting physician.  No radiographic interpretation.   NM PET Image Initial (PI) Skull Base To Thigh Result Date: 12/22/2023 CLINICAL DATA:  Initial treatment strategy for abdominal lymphadenopathy. EXAM: NUCLEAR MEDICINE PET SKULL BASE TO THIGH TECHNIQUE: 8.8 mCi F-18 FDG was injected intravenously. Full-ring PET imaging was performed from the skull base to thigh after the radiotracer. CT data was obtained and used for attenuation correction and anatomic localization. Fasting blood glucose: 74 mg/dl COMPARISON:  None Available. FINDINGS: Mediastinal blood pool activity: SUV max 1.1 Liver activity: SUV max 2.1 NECK: Intense hypermetabolic enlarged LEFT supraclavicular lymph nodes. Example node measures 2.3 cm on image 30 with SUV max equal 13.8. Incidental CT findings: CHEST: Several small hypermetabolic lymph nodes along the esophagus extending to the  retrocrural space. Enlarged node in the retrocrural space measuring 14 mm (image 71) with SUV max equal 10.1. No suspicious pulmonary nodules Incidental CT findings: None. ABDOMEN/PELVIS: Large mass hypermetabolic tissue draped over the aorta and IVC and extending into the central small bowel mesentery of the mid LEFT abdomen. Mesenteric mass measures 12.8 x 7.8 cm on image 100  with SUV max equal 16.4. Nodal hypermetabolic tissue draped over the aorta obscures the planes aroung the aorta and is intensely metabolic with SUV max equal 16.5. Hypermetabolic central mesenteric nodal metastasis extend into the upper pelvis. Several hypermetabolic bilateral iliac nodes. Hypermetabolic peritoneal tissue along the deep peritoneal reflection into the posterior cul-de-sac. Incidental CT findings: Mild RIGHT hydronephrosis related to the pelvic mass. The RIGHT ureter is obstructed at the level of the RIGHT psoas muscle. SKELETON: No focal hypermetabolic activity to suggest skeletal metastasis. Incidental CT findings: None. IMPRESSION: 1. Intensely hypermetabolic LEFT supraclavicular, mediastinal, and retroperitoneal nodes consistent high-grade lymphoma. 2. Large central mesenteric nodal mass also consistent with high-grade lymphoma. 3. Normal spleen and bone marrow. 4. RIGHT hydronephrosis and hydroureter related to RIGHT ureteral obstruction Electronically Signed   By: Genevive Bi M.D.   On: 12/22/2023 16:26   CT ABDOMINAL MASS BIOPSY Result Date: 12/21/2023 CLINICAL DATA:  Large mesenteric abdominal mass and retroperitoneal lymphadenopathy. EXAM: CT GUIDED CORE BIOPSY OF MESENTERIC ABDOMINAL MASS ANESTHESIA/SEDATION: Moderate (conscious) sedation was employed during this procedure. A total of Versed 2.0 mg and Fentanyl 100 mcg was administered intravenously. Moderate Sedation Time: 17 minutes. The patient's level of consciousness and vital signs were monitored continuously by radiology nursing throughout the procedure  under my direct supervision. PROCEDURE: The procedure risks, benefits, and alternatives were explained to the patient. Questions regarding the procedure were encouraged and answered. The patient understands and consents to the procedure. A time-out was performed prior to initiating the procedure. The left anterior abdominal wall was prepped with chlorhexidine in a sterile fashion, and a sterile drape was applied covering the operative field. A sterile gown and sterile gloves were used for the procedure. Local anesthesia was provided with 1% Lidocaine. CT was performed in a supine position to localize a mesenteric abdominal mass. Under CT guidance, a 17 gauge trocar needle was advanced into the mass. Five separate coaxial 18 gauge core biopsy samples were obtained and submitted on saline soaked Telfa gauze. Prior to outer needle removal, a slurry of Gel-Foam EmboCubes was injected through the outer needle as the needle was retracted and removed. Additional CT was performed. RADIATION DOSE REDUCTION: This exam was performed according to the departmental dose-optimization program which includes automated exposure control, adjustment of the mA and/or kV according to patient size and/or use of iterative reconstruction technique. COMPLICATIONS: None FINDINGS: Solid tissue was obtained from sampling the dominant large anterior mesenteric abdominal mass located just to the left of midline. IMPRESSION: CT-guided biopsy of large mesenteric abdominal mass. Electronically Signed   By: Irish Lack M.D.   On: 12/21/2023 13:15   CT ABDOMEN PELVIS W CONTRAST Result Date: 12/15/2023 CLINICAL DATA:  Nausea and bloating. Mass lesion. * Tracking Code: BO * EXAM: CT ABDOMEN AND PELVIS WITH CONTRAST TECHNIQUE: Multidetector CT imaging of the abdomen and pelvis was performed using the standard protocol following bolus administration of intravenous contrast. RADIATION DOSE REDUCTION: This exam was performed according to the  departmental dose-optimization program which includes automated exposure control, adjustment of the mA and/or kV according to patient size and/or use of iterative reconstruction technique. CONTRAST:  80mL OMNIPAQUE IOHEXOL 300 MG/ML  SOLN COMPARISON:  CT 2008 FINDINGS: Lower chest: Breathing motion lung bases. There is some linear opacity at the bases likely scar or atelectasis. Mild ground-glass. No pleural effusion. Hepatobiliary: Gallbladder is mildly distended. No intrahepatic biliary ductal dilatation. Pancreas: No intrinsic pancreatic mass Spleen: Spleen is enlarged with a cephalocaudal length of 14.8 cm. Preserved enhancement.  Adrenals/Urinary Tract: Adrenal glands are preserved. No enhancing renal mass. There is mild right-sided renal collecting system dilatation. The course of the ureter in the mid retroperitoneum becomes encased by retroperitoneal mass and may be contributing to the collecting system dilatation. No left-sided renal collecting system dilatation. The left ureter has a normal course and caliber down to the bladder. Preserved contours of the underdistended urinary bladder. Stomach/Bowel: Large bowel has a normal course and caliber scattered colonic stool. Left-sided colonic diverticula. Stomach is distended with fluid. The proximal duodenal is distended with fluid. The third portion is encased by soft tissue mass. The small bowel more distally is nondilated. There are several areas of small bowel which are poorly defined with the extensive mesenteric mass. Vascular/Lymphatic: Normal caliber aorta with scattered vascular calcifications along the aorta and branch vessels. The IVC is displaced anterior by a retroperitoneal mass and is somewhat narrowed and encased. Lumen is patent. There is also encasement of the renal vasculature, mesenteric vasculature. There is large confluence central mesenteric mass again encasing vessels. In the axial plane on series 3, image 51 for example of the  mesenteric lesion measures 21.8 x 8.9 cm. Cephalocaudal extent of the lesion which is confluence approaches 20.7 cm. There is also significant components retroperitoneal as stated above. The mass lesion for example encasing the retroperitoneal vessels and structures on image 36 of series 3 would measured 12.2 x 7.3 cm. Separate retrocrural lesion on the right measuring 2.9 by 1.6 cm. Reproductive: Prostate is unremarkable. Other: Mild ascites. Scattered mesenteric haziness. Few varices identified in the mesentery Musculoskeletal: Scattered degenerative changes of the spine and pelvis. Critical Value/emergent results were called by telephone at the time of interpretation on 12/15/2023 at 15:30 pm to provider PA Cruz Condon, who verbally acknowledged these results. IMPRESSION: Large soft tissue mass along the mesentery measuring up to 21 cm. There is encasement of central mesenteric vessels as well as loss of tissue planes between mass and several bowel loops. There is also a caliber change noted along the bowel involving the third portion of the duodenum. A very mild partial obstruction would be in the differential associated with this mass. Please correlate with any specific symptoms of slow gastric emptying. There are also components of mass along the retroperitoneum with encasement of retroperitoneal structures and narrowing of vasculature with some varices. Overall appearance would be worrisome for malignancy such as lymphoma. The spleen is slightly enlarged without intrinsic splenic mass. Mild ascites. Colonic diverticula. Electronically Signed   By: Karen Kays M.D.   On: 12/15/2023 15:38     Assessment and plan- Patient is a 63 y.o. male with history of stage IV diffuse large B-cell lymphoma high IPI risk s/p 1 cycle of Pola he has been using it all the time every time she says she is receiving oral RCHP chemotherapy here for toxicity assessment  Overall patient has tolerated treatment well and I  will see him back in 10 days for cycle 2 as planned.  Double hit FISH is currently pending and hopefully will be back in time before we start cycle 2.  He is CNS IPI risk is not high and therefore he does not require any intrathecal methotrexate.  No evidence of tumor lysis syndrome based on today's labs.  No need for IV fluids today.   Visit Diagnosis 1. Diffuse large B-cell lymphoma of intra-abdominal lymph nodes (HCC)      Dr. Owens Shark, MD, MPH [facility] at [facility] [phone] 01/12/2024 1:18 PM

## 2024-01-13 ENCOUNTER — Other Ambulatory Visit: Payer: Self-pay

## 2024-01-15 ENCOUNTER — Encounter: Payer: Self-pay | Admitting: Oncology

## 2024-01-17 ENCOUNTER — Telehealth: Payer: Self-pay | Admitting: *Deleted

## 2024-01-17 ENCOUNTER — Telehealth: Payer: Self-pay

## 2024-01-17 ENCOUNTER — Encounter: Payer: Self-pay | Admitting: Oncology

## 2024-01-17 NOTE — Telephone Encounter (Signed)
 I spoke to daughter late in the day about 5 pm and did his papers but there is a place for him to sign and another part section 2 and then look for [employer]. I put yellow dots and I do not know what paient wants about disability  and amount of money. The daughter will go with her to his work and I left a copy at downstairs.

## 2024-01-17 NOTE — Telephone Encounter (Signed)
 Patient's daughter Anna Genre R.) called and stated that the patients employer stated that the disability paperwork they picked up isn't fully completed. They told patient and his daughter that the form that the doctor is suppose to sign is missing and that is crucial the paperwork is filled out correctly. Patient's daughter would like a call back from the nurse in charge of paperwork ASAP due to patient's paperwork due tomorrow 01/18/24. She would like a call back at [phone].

## 2024-01-19 ENCOUNTER — Encounter: Payer: Self-pay | Admitting: Oncology

## 2024-01-22 ENCOUNTER — Encounter: Payer: Self-pay | Admitting: Oncology

## 2024-01-22 ENCOUNTER — Ambulatory Visit: Payer: 59 | Admitting: Oncology

## 2024-01-22 ENCOUNTER — Ambulatory Visit: Payer: 59

## 2024-01-22 ENCOUNTER — Inpatient Hospital Stay (HOSPITAL_BASED_OUTPATIENT_CLINIC_OR_DEPARTMENT_OTHER): Payer: 59 | Admitting: Oncology

## 2024-01-22 ENCOUNTER — Other Ambulatory Visit: Payer: 59

## 2024-01-22 VITALS — BP 121/83 | HR 89 | Temp 97.6°F | Resp 20 | Wt 164.0 lb

## 2024-01-22 DIAGNOSIS — Z5112 Encounter for antineoplastic immunotherapy: Secondary | ICD-10-CM | POA: Diagnosis not present

## 2024-01-22 DIAGNOSIS — Z7189 Other specified counseling: Secondary | ICD-10-CM | POA: Diagnosis not present

## 2024-01-22 DIAGNOSIS — C8333 Diffuse large B-cell lymphoma, intra-abdominal lymph nodes: Secondary | ICD-10-CM

## 2024-01-22 NOTE — Progress Notes (Unsigned)
 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  He had a CT abdomen and pelvis with contrast which showed large soft tissue mass along the mesentery measuring up to 21 cm with encasement of central mesenteric vessels and loss of tissue planes between the mass and several bowel loops.  Caliber change along the third portion of duodenum with partial mild obstruction.  Findings concerning for lymphoma.  Serum creatinine mildly elevated at 1.4.  Spleen slightly enlarged without intrinsic splenic mass.  Patient denies any significant nausea or vomiting.  He has early satiety   PET CT scan from 12/22/2023 showed Hypermetabolic left supraclavicular mediastinal retroperitoneal nodes along with large central mesenteric nodal mass consistent with lymphoma.  SUV in these areas ranging around SUV 16.  Central mesenteric nodal mass extending into the upper pelvis.  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  Cells positive for CD20 with coexpression of CD10 and BCL6 consistent with germinal center/folic killer phenotype and aberrantly express Bcl-2 strongly.  MOM1 and CD30 are negative.  EBV ISH negative.  Proliferation ranges from  20 to 80% with overall average of 60 to 70%.  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  Interval history-no acute issues since last visit.  Patient tolerated Pola R [facility]P chemotherapy well.  ECOG PS- 0 Pain scale- 0 Opioid associated constipation- ***  Review of systems- ROS    No Known Allergies   History reviewed. No pertinent past medical history.   Past Surgical History:  Procedure Laterality Date   FISTULOTOMY N/A 01/10/2018   Procedure: FISTULOTOMY;  Surgeon: Carolan Shiver, MD;  Location: [facility] ORS;  Service: General;  Laterality: N/A;   MASS BIOPSY Left 12/28/2023   Procedure: NECK MASS BIOPSY;  Surgeon: Leafy Ro, MD;  Location: [facility] ORS;  Service: General;  Laterality: Left;   PORTACATH PLACEMENT N/A 12/28/2023   Procedure: INSERTION PORT-A-CATH;  Surgeon: Leafy Ro, MD;  Location: [facility] ORS;  Service: General;  Laterality: N/A;    Social History   Socioeconomic History   Marital status: Single    Spouse name: Not on file   Number of children: 4   Years of education: Not on file   Highest education level: Not on file  Occupational History   Not on file  Tobacco Use   Smoking status: Former    Types: Cigarettes    Passive exposure: Past   Smokeless tobacco: Never  Vaping Use   Vaping status: Never Used  Substance and Sexual Activity   Alcohol use: Not Currently   Drug use: Not Currently    Types: Cocaine  Comment: years ago    Sexual activity: Yes  Other Topics Concern   Not on file  Social History Narrative   Not on file   Social Drivers of Health   Financial Resource Strain: Not on file  Food Insecurity: No Food Insecurity (12/27/2023)   Hunger Vital Sign    Worried About Running Out of Food in the Last Year: Never true    Ran Out of Food in  the Last Year: Never true  Transportation Needs: No Transportation Needs (12/27/2023)   PRAPARE - Administrator, Civil Service (Medical): No    Lack of Transportation (Non-Medical): No  Physical Activity: Inactive (12/27/2023)   Exercise Vital Sign    Days of Exercise per Week: 0 days    Minutes of Exercise per Session: 0 min  Stress: Not on file  Social Connections: Not on file  Intimate Partner Violence: Not At Risk (12/27/2023)   Humiliation, Afraid, Rape, and Kick questionnaire    Fear of Current or Ex-Partner: No    Emotionally Abused: No    Physically Abused: No    Sexually Abused: No    Family History  Problem Relation Age of Onset   Colon cancer Mother    Pancreatic cancer Father    Cancer Sister      Current Outpatient Medications:    HYDROcodone-acetaminophen (NORCO/VICODIN) 5-325 MG tablet, Take 1 tablet by mouth every 6 (six) hours as needed for moderate pain (pain score 4-6)., Disp: 15 tablet, Rfl: 0   sulfamethoxazole-trimethoprim (BACTRIM DS) 800-160 MG tablet, Take 1 tablet by mouth 2 (two) times daily., Disp: , Rfl:   Physical exam:  Vitals:   01/22/24 0851  BP: 121/83  Pulse: 89  Resp: 20  Temp: 97.6 F (36.4 C)  SpO2: 100%  Weight: 164 lb (74.4 kg)   Physical Exam      Latest Ref Rng & Units 01/12/2024    9:43 AM  CMP  Glucose 70 - 99 mg/dL 409   BUN 8 - 23 mg/dL 12   Creatinine 8.11 - 1.24 mg/dL 9.14   Sodium 782 - 956 mmol/L 136   Potassium 3.5 - 5.1 mmol/L 4.0   Chloride 98 - 111 mmol/L 105   CO2 22 - 32 mmol/L 21   Calcium 8.9 - 10.3 mg/dL 8.2   Total Protein 6.5 - 8.1 g/dL 6.1   Total Bilirubin 0.0 - 1.2 mg/dL 0.3   Alkaline Phos 38 - 126 U/L 86   AST 15 - 41 U/L 20   ALT 0 - 44 U/L 17       Latest Ref Rng & Units 01/12/2024    9:43 AM  CBC  WBC 4.0 - 10.5 K/uL 6.3   Hemoglobin 13.0 - 17.0 g/dL 21.3   Hematocrit 08.6 - 52.0 % 39.2   Platelets 150 - 400 K/uL 168       NM Cardiac Muga Rest Result Date:  01/02/2024 CLINICAL DATA:  Follicular lymphoma.  Assess cardiac function EXAM: NUCLEAR MEDICINE CARDIAC BLOOD POOL IMAGING (MUGA) TECHNIQUE: Cardiac multi-gated acquisition was performed at rest following intravenous injection of Tc-84m labeled red blood cells. RADIOPHARMACEUTICALS:  20.2 mCi Tc-66m pertechnetate in-vitro labeled red blood cells IV COMPARISON:  None Available. FINDINGS: No  focal wall motion abnormality of the left ventricle. Calculated left ventricular ejection fraction equals 53.2% IMPRESSION: Left ventricular ejection fraction equals53.2 %. Electronically Signed   By: Genevive Bi M.D.   On: 01/02/2024 16:11   DG Chest Port 1  View Result Date: 12/28/2023 CLINICAL DATA:  Central line placement. EXAM: PORTABLE CHEST 1 VIEW COMPARISON:  PET-CT 12/22/2023. FINDINGS: 1326 hours. Lordotic positioning. Left IJ Port-A-Cath projects to the level of the mid right atrium. The heart size and mediastinal contours are stable allowing for the lordotic positioning. The lungs appear clear. There is no pleural effusion or pneumothorax. Surgical clips in the lower left neck. IMPRESSION: Left IJ Port-A-Cath projects to the level of the mid right atrium. No evidence of pneumothorax. Electronically Signed   By: Carey Bullocks M.D.   On: 12/28/2023 14:16   DG C-Arm 1-60 Min-No Report Result Date: 12/28/2023 Fluoroscopy was utilized by the requesting physician.  No radiographic interpretation.     Assessment and plan- Patient is a 63 y.o. male ***   Visit Diagnosis 1. Diffuse large B-cell lymphoma of intra-abdominal lymph nodes (HCC)      Dr. Owens Shark, MD, MPH [facility] [phone] 01/22/2024 12:56 PM

## 2024-01-22 NOTE — Telephone Encounter (Signed)
 I called her

## 2024-01-22 NOTE — Progress Notes (Signed)
 DISCONTINUE ON PATHWAY REGIMEN - Lymphoma and CLL     A cycle is every 28 days:     Rituximab-xxxx      Bendamustine   **Always confirm dose/schedule in your pharmacy ordering system**  PRIOR TREATMENT: [id number]: Bendamustine + Rituximab IV (90/375) q28 Days x 6 Cycles  START ON PATHWAY REGIMEN - Lymphoma and CLL     A cycle is every 21 days:     Prednisone      Rituximab-xxxx      Etoposide      Doxorubicin      Vincristine      Cyclophosphamide      Filgrastim-xxxx   **Always confirm dose/schedule in your pharmacy ordering system**  Patient Characteristics: Double Hit Lymphoma, First Line Disease Type: Not Applicable Disease Type: Double Hit Lymphoma Disease Type: Not Applicable Line of therapy: First Line Intent of Therapy: Curative Intent, Discussed with Patient

## 2024-01-22 NOTE — Addendum Note (Signed)
 Addended by: Amaryllis Dyke on: 01/22/2024 03:28 PM   Modules accepted: Orders

## 2024-01-23 ENCOUNTER — Encounter: Payer: Self-pay | Admitting: Oncology

## 2024-01-24 ENCOUNTER — Inpatient Hospital Stay: Payer: 59

## 2024-01-24 ENCOUNTER — Other Ambulatory Visit: Payer: Self-pay

## 2024-01-24 ENCOUNTER — Other Ambulatory Visit: Payer: Self-pay | Admitting: Oncology

## 2024-01-24 ENCOUNTER — Inpatient Hospital Stay: Payer: 59 | Admitting: Oncology

## 2024-01-24 ENCOUNTER — Encounter: Payer: Self-pay | Admitting: Oncology

## 2024-01-24 MED ORDER — PREDNISONE 20 MG PO TABS: 100.0000 mg | ORAL_TABLET | Freq: Every day | ORAL | 2 refills | Status: DC

## 2024-01-24 NOTE — Progress Notes (Signed)
 Pharmacist Chemotherapy Monitoring - Initial Assessment    Anticipated start date: 01/29/24   The following has been reviewed per standard work regarding the patient's treatment regimen: The patient's diagnosis, treatment plan and drug doses, and organ/hematologic function Lab orders and baseline tests specific to treatment regimen  The treatment plan start date, drug sequencing, and pre-medications Prior authorization status  Patient's documented medication list, including drug-drug interaction screen and prescriptions for anti-emetics and supportive care specific to the treatment regimen The drug concentrations, fluid compatibility, administration routes, and timing of the medications to be used The patient's access for treatment and lifetime cumulative dose history, if applicable  The patient's medication allergies and previous infusion related reactions, if applicable   Changes made to treatment plan:  N/A  Follow up needed:  N/A   Sharen Hones, PharmD, BCPS Clinical Pharmacist   01/24/2024  2:24 PM

## 2024-01-25 ENCOUNTER — Encounter: Payer: Self-pay | Admitting: Oncology

## 2024-01-26 ENCOUNTER — Other Ambulatory Visit: Payer: Self-pay | Admitting: *Deleted

## 2024-01-26 ENCOUNTER — Inpatient Hospital Stay: Payer: 59

## 2024-01-26 DIAGNOSIS — C8333 Diffuse large B-cell lymphoma, intra-abdominal lymph nodes: Secondary | ICD-10-CM

## 2024-01-29 ENCOUNTER — Inpatient Hospital Stay: Payer: 59 | Attending: Oncology

## 2024-01-29 ENCOUNTER — Encounter: Payer: Self-pay | Admitting: Oncology

## 2024-01-29 ENCOUNTER — Inpatient Hospital Stay: Payer: 59

## 2024-01-29 ENCOUNTER — Inpatient Hospital Stay (HOSPITAL_BASED_OUTPATIENT_CLINIC_OR_DEPARTMENT_OTHER): Payer: 59 | Admitting: Oncology

## 2024-01-29 VITALS — Wt 167.7 lb

## 2024-01-29 VITALS — BP 123/75 | HR 89 | Temp 98.1°F | Resp 16

## 2024-01-29 DIAGNOSIS — Z5181 Encounter for therapeutic drug level monitoring: Secondary | ICD-10-CM

## 2024-01-29 DIAGNOSIS — R109 Unspecified abdominal pain: Secondary | ICD-10-CM | POA: Diagnosis not present

## 2024-01-29 DIAGNOSIS — R7989 Other specified abnormal findings of blood chemistry: Secondary | ICD-10-CM | POA: Diagnosis not present

## 2024-01-29 DIAGNOSIS — C8333 Diffuse large B-cell lymphoma, intra-abdominal lymph nodes: Secondary | ICD-10-CM | POA: Insufficient documentation

## 2024-01-29 DIAGNOSIS — Z800 Family history of malignant neoplasm of digestive organs: Secondary | ICD-10-CM | POA: Diagnosis not present

## 2024-01-29 DIAGNOSIS — Z5112 Encounter for antineoplastic immunotherapy: Secondary | ICD-10-CM | POA: Insufficient documentation

## 2024-01-29 DIAGNOSIS — R6881 Early satiety: Secondary | ICD-10-CM | POA: Diagnosis not present

## 2024-01-29 DIAGNOSIS — Z79899 Other long term (current) drug therapy: Secondary | ICD-10-CM | POA: Diagnosis not present

## 2024-01-29 DIAGNOSIS — Z809 Family history of malignant neoplasm, unspecified: Secondary | ICD-10-CM | POA: Diagnosis not present

## 2024-01-29 DIAGNOSIS — Z79620 Long term (current) use of immunosuppressive biologic: Secondary | ICD-10-CM | POA: Diagnosis not present

## 2024-01-29 DIAGNOSIS — Z87891 Personal history of nicotine dependence: Secondary | ICD-10-CM | POA: Insufficient documentation

## 2024-01-29 DIAGNOSIS — Z5189 Encounter for other specified aftercare: Secondary | ICD-10-CM | POA: Insufficient documentation

## 2024-01-29 DIAGNOSIS — R140 Abdominal distension (gaseous): Secondary | ICD-10-CM | POA: Diagnosis not present

## 2024-01-29 DIAGNOSIS — Z5111 Encounter for antineoplastic chemotherapy: Secondary | ICD-10-CM | POA: Insufficient documentation

## 2024-01-29 LAB — CMP (CANCER CENTER ONLY)
ALT: 21 U/L (ref 0–44)
AST: 21 U/L (ref 15–41)
Albumin: 4.2 g/dL (ref 3.5–5.0)
Alkaline Phosphatase: 76 U/L (ref 38–126)
Anion gap: 9 (ref 5–15)
BUN: 13 mg/dL (ref 8–23)
CO2: 22 mmol/L (ref 22–32)
Calcium: 8.8 mg/dL — ABNORMAL LOW (ref 8.9–10.3)
Chloride: 106 mmol/L (ref 98–111)
Creatinine: 0.91 mg/dL (ref 0.61–1.24)
GFR, Estimated: >60 mL/min (ref >60)
Glucose, Bld: 108 mg/dL — ABNORMAL HIGH (ref 70–99)
Potassium: 3.9 mmol/L (ref 3.5–5.1)
Sodium: 137 mmol/L (ref 135–145)
Total Bilirubin: 0.7 mg/dL (ref 0.0–1.2)
Total Protein: 6.7 g/dL (ref 6.5–8.1)

## 2024-01-29 LAB — CBC WITH DIFFERENTIAL/PLATELET
Abs Immature Granulocytes: 0.37 10*3/uL — ABNORMAL HIGH (ref 0.00–0.07)
Basophils Absolute: 0.1 10*3/uL (ref 0.0–0.1)
Basophils Relative: 1 %
Eosinophils Absolute: 0.1 10*3/uL (ref 0.0–0.5)
Eosinophils Relative: 0 %
HCT: 39.9 % (ref 39.0–52.0)
Hemoglobin: 13.5 g/dL (ref 13.0–17.0)
Immature Granulocytes: 3 %
Lymphocytes Relative: 11 %
Lymphs Abs: 1.6 10*3/uL (ref 0.7–4.0)
MCH: 29 pg (ref 26.0–34.0)
MCHC: 33.8 g/dL (ref 30.0–36.0)
MCV: 85.8 fL (ref 80.0–100.0)
Monocytes Absolute: 1.4 10*3/uL — ABNORMAL HIGH (ref 0.1–1.0)
Monocytes Relative: 10 %
Neutro Abs: 11.3 10*3/uL — ABNORMAL HIGH (ref 1.7–7.7)
Neutrophils Relative %: 75 %
Platelets: 340 10*3/uL (ref 150–400)
RBC: 4.65 MIL/uL (ref 4.22–5.81)
RDW: 14.6 % (ref 11.5–15.5)
WBC: 14.9 10*3/uL — ABNORMAL HIGH (ref 4.0–10.5)
nRBC: 0 % (ref 0.0–0.2)

## 2024-01-29 MED ORDER — SODIUM CHLORIDE 0.9 % IV SOLN
INTRAVENOUS | Status: AC
  Filled 2024-01-29: qty 250

## 2024-01-29 MED ORDER — SODIUM CHLORIDE 0.9 % IV SOLN
375.0000 mg/m2 | Freq: Once | INTRAVENOUS | Status: AC
  Administered 2024-01-29: 700 mg via INTRAVENOUS
  Filled 2024-01-29: qty 70

## 2024-01-29 MED ORDER — ACETAMINOPHEN 325 MG PO TABS
650.0000 mg | ORAL_TABLET | Freq: Once | ORAL | Status: AC
  Administered 2024-01-29: 650 mg via ORAL
  Filled 2024-01-29: qty 2

## 2024-01-29 MED ORDER — VINCRISTINE SULFATE CHEMO INJECTION 1 MG/ML
Freq: Once | INTRAVENOUS | Status: AC
  Filled 2024-01-29: qty 9

## 2024-01-29 MED ORDER — DIPHENHYDRAMINE HCL 25 MG PO CAPS
50.0000 mg | ORAL_CAPSULE | Freq: Once | ORAL | Status: AC
  Administered 2024-01-29: 50 mg via ORAL
  Filled 2024-01-29: qty 2

## 2024-01-29 MED ORDER — PALONOSETRON HCL INJECTION 0.25 MG/5ML
0.2500 mg | Freq: Once | INTRAVENOUS | Status: AC
  Administered 2024-01-29: 0.25 mg via INTRAVENOUS
  Filled 2024-01-29: qty 5

## 2024-01-29 NOTE — Patient Instructions (Signed)
 Instrucciones al darle de alta: Discharge Instructions Gracias por elegir al [facility] de Cncer de [place] para brindarle atencin mdica de oncologa y Teacher, English as a foreign language.   Si usted tiene una cita de laboratorio con [employer] de [place], por favor vaya directamente al [employer] de Cncer y regstrese en el rea de Engineer, maintenance (IT).   Use ropa cmoda y [place] para tener fcil acceso a las vas del Portacath (acceso venoso de Set designer duracin) o la lnea PICC (catter central colocado por va perifrica).   Nos esforzamos por ofrecerle tiempo de calidad con su proveedor. Es posible que tenga que volver a programar su cita si llega tarde (15 minutos o ms).  El llegar tarde le afecta a usted y a otros pacientes cuyas citas son posteriores a Armed forces operational officer.  Adems, si usted falta a tres o ms citas sin avisar a la oficina, puede ser retirado(a) de la clnica a discrecin del proveedor.      Para las solicitudes de renovacin de recetas, pida a su farmacia que se ponga en contacto con nuestra oficina y deje que transcurran 72 horas para que se complete el proceso de las renovaciones.    Hoy usted recibi los siguientes agentes de quimioterapia e/o inmunoterapia rituxan/ EPOCH   Para ayudar a prevenir las nuseas y los vmitos despus de su tratamiento, le recomendamos que tome su medicamento para las nuseas segn las indicaciones.  LOS SNTOMAS QUE DEBEN COMUNICARSE INMEDIATAMENTE SE INDICAN A CONTINUACIN: *FIEBRE SUPERIOR A 100.4 F (38 C) O MS *ESCALOFROS O SUDORACIN *NUSEAS Y VMITOS QUE NO SE CONTROLAN CON EL MEDICAMENTO PARA LAS NUSEAS *DIFICULTAD INUSUAL PARA RESPIRAR  *MORETONES O HEMORRAGIAS NO HABITUALES *PROBLEMAS URINARIOS (dolor o ardor al Geographical information systems officer o frecuencia para Geographical information systems officer) *PROBLEMAS INTESTINALES (diarrea inusual, estreimiento, dolor cerca del ano) SENSIBILIDAD EN LA BOCA Y EN LA GARGANTA CON O SIN LA PRESENCIA DE LCERAS (dolor de garganta, llagas en la boca o dolor de muelas/dientes) ERUPCIN,  HINCHAZN O DOLORES INUSUALES FLUJO VAGINAL INUSUAL O PICAZN/RASQUIA    Los puntos marcados con un asterisco ( *) indican una posible emergencia y debe hacer un seguimiento tan pronto como le sea posible o vaya al Departamento de Emergencias si se le presenta algn problema.  Por favor, muestre la [place] DE ADVERTENCIA DE Marc Morgans DE ADVERTENCIA DE [place] Fanti al registrarse en [address] de Emergencias y a la enfermera de triaje.  Si tiene preguntas despus de su visita o necesita cancelar o volver a programar su cita, por favor pngase en contacto con [facility] - A DEPT OF Eligha Bridegroom [facility]  [phone]  y siga las instrucciones. Las horas de oficina son de 8:00 a.m. a 4:30 p.m. de lunes a viernes. Por favor, tenga en cuenta que los mensajes de voz que se dejan despus de las 4:00 p.m. posiblemente no se devolvern hasta el siguiente da de [place].  Cerramos los fines de semana y [employer]. En todo momento tiene acceso a una enfermera para preguntas urgentes. Por favor, llame al nmero principal de la clnica  [phone] y siga las instrucciones.   Para cualquier pregunta que no sea de carcter urgente, tambin puede ponerse en contacto con su proveedor [employer]. Ahora ofrecemos visitas electrnicas para cualquier persona mayor de 18 aos que solicite atencin mdica en lnea para los sntomas que no sean urgentes. Para ms detalles vaya a [email or web address].   Tambin puede bajar la aplicacin de MyChart! Vaya a la tienda de  aplicaciones, busque "MyChart", abra la aplicacin, seleccione [place], e ingrese con su nombre de usuario y la contrasea de Clinical cytogeneticist.

## 2024-01-29 NOTE — Progress Notes (Signed)
 Patient has no concerns

## 2024-01-29 NOTE — Progress Notes (Signed)
 Rapid Infusion Rituximab Pharmacist Evaluation  William Mathews is a 63 y.o. male being treated with rituximab for DLBCL. This patient may be considered for RIR.   A pharmacist has verified the patient tolerated rituximab infusions per the [facility] standard infusion protocol without grade 3-4 infusion reactions. The treatment plan will be updated to reflect RIR if the patient qualifies per the checklist below:   Age > 7 years old Yes   Clinically significant cardiovascular disease No   Circulating lymphocyte count < 5000/uL prior to cycle two Yes  Lab Results  Component Value Date   LYMPHSABS 1.6 01/29/2024    Prior documented grade 3-4 infusion reaction to rituximab No   Prior documented grade 1-2 infusion reaction to rituximab (If YES, Pharmacist will confirm with Physician if patient is still a candidate for RIR) No   Previous rituximab infusion within the past 6 months Yes   Treatment Plan updated orders to reflect RIR Yes    [name] does meet the criteria for Rapid Infusion Rituximab. This patient is going to be switched to rapid infusion rituximab.   Ebony Hail, Pharm.D., CPP 01/29/2024@9 :23 AM

## 2024-01-29 NOTE — Progress Notes (Signed)
 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  He had a CT abdomen and pelvis with contrast which showed large soft tissue mass along the mesentery measuring up to 21 cm with encasement of central mesenteric vessels and loss of tissue planes between the mass and several bowel loops.  Caliber change along the third portion of duodenum with partial mild obstruction.  Findings concerning for lymphoma.  Serum creatinine mildly elevated at 1.4.  Spleen slightly enlarged without intrinsic splenic mass.  Patient denies any significant nausea or vomiting.  He has early satiety   PET CT scan from 12/22/2023 showed Hypermetabolic left supraclavicular mediastinal retroperitoneal nodes along with large central mesenteric nodal mass consistent with lymphoma.  SUV in these areas ranging around SUV 16.  Central mesenteric nodal mass extending into the upper pelvis.  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  Cells positive for CD20 with coexpression of CD10 and BCL6 consistent with germinal center/folic killer phenotype and aberrantly express Bcl-2 strongly.  MOM1 and CD30 are negative.  EBV ISH negative.   Proliferation ranges from 20 to 80% with overall average of 60 to 70%.  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  CNS IPI risk not high and therefore CNS prophylaxis is not recommended  Patient received Garlon Hatchet CHP chemotherapy for cycle 1.  Double hit testing showed make gene rearrangement at 75%. Although Bcl-2 and BCL6 rearrangement was detected on FISH these were not known partners to make and therefore it was reported as a pseudo double hit.  However given the fact that this is a transformed diffuse large B-cell lymphoma from a pre-existing follicular lymphoma this would fit the pattern of a double hit lymphoma/ high grade B cell lymphoma.  Plan was therefore made to switch him to dose adjusted R-EPOCH chemotherapy for the remainder of the cycles    Interval history-presently he feels well and denies any complaints at this time  ECOG PS- 0 Pain scale- 0   Review of systems- Review of Systems  Constitutional:  Negative for chills, fever, malaise/fatigue and weight loss.  HENT:  Negative for congestion, ear discharge and nosebleeds.   Eyes:  Negative for blurred vision.  Respiratory:  Negative for cough, hemoptysis, sputum production, shortness of breath and wheezing.   Cardiovascular:  Negative for chest pain, palpitations, orthopnea and claudication.  Gastrointestinal:  Negative for abdominal pain, blood in stool, constipation, diarrhea, heartburn, melena, nausea and vomiting.  Genitourinary:  Negative for dysuria, flank pain, frequency, hematuria and urgency.  Musculoskeletal:  Negative for back pain, joint pain  and myalgias.  Skin:  Negative for rash.  Neurological:  Negative for dizziness, tingling, focal weakness, seizures, weakness and headaches.  Endo/Heme/Allergies:  Does not bruise/bleed  easily.  Psychiatric/Behavioral:  Negative for depression and suicidal ideas. The patient does not have insomnia.       No Known Allergies   No past medical history on file.   Past Surgical History:  Procedure Laterality Date   FISTULOTOMY N/A 01/10/2018   Procedure: FISTULOTOMY;  Surgeon: Carolan Shiver, MD;  Location: [facility] ORS;  Service: General;  Laterality: N/A;   MASS BIOPSY Left 12/28/2023   Procedure: NECK MASS BIOPSY;  Surgeon: Leafy Ro, MD;  Location: [facility] ORS;  Service: General;  Laterality: Left;   PORTACATH PLACEMENT N/A 12/28/2023   Procedure: INSERTION PORT-A-CATH;  Surgeon: Leafy Ro, MD;  Location: [facility] OR[facility];  Service: General;  Laterality: N/A;    Social History   Socioeconomic History   Marital status: Single    Spouse name: Not on file   Number of children: 4   Years of education: Not on file   Highest education level: Not on file  Occupational History   Not on file  Tobacco Use   Smoking status: Former    Types: Cigarettes    Passive exposure: Past   Smokeless tobacco: Never  Vaping Use   Vaping status: Never Used  Substance and Sexual Activity   Alcohol use: Not Currently   Drug use: Not Currently    Types: Cocaine    Comment: years ago    Sexual activity: Yes  Other Topics Concern   Not on file  Social History Narrative   Not on file   Social Drivers of Health   Financial Resource Strain: Not on file  Food Insecurity: No Food Insecurity (12/27/2023)   Hunger Vital Sign    Worried About Running Out of Food in the Last Year: Never true    Ran Out of Food in the Last Year: Never true  Transportation Needs: No Transportation Needs (12/27/2023)   PRAPARE - Administrator, Civil Service (Medical): No    Lack of Transportation (Non-Medical): No  Physical Activity: Inactive (12/27/2023)   Exercise Vital Sign    Days of Exercise per Week: 0 days    Minutes of Exercise per Session: 0 min  Stress: Not on file  Social  Connections: Not on file  Intimate Partner Violence: Not At Risk (12/27/2023)   Humiliation, Afraid, Rape, and Kick questionnaire    Fear of Current or Ex-Partner: No    Emotionally Abused: No    Physically Abused: No    Sexually Abused: No    Family History  Problem Relation Age of Onset   Colon cancer Mother    Pancreatic cancer Father    Cancer Sister      Current Outpatient Medications:    HYDROcodone-acetaminophen (NORCO/VICODIN) 5-325 MG tablet, Take 1 tablet by mouth every 6 (six) hours as needed for moderate pain (pain score 4-6)., Disp: 15 tablet, Rfl: 0   predniSONE (DELTASONE) 20 MG tablet, Take 5 tablets (100 mg total) by mouth daily with breakfast., Disp: 35 tablet, Rfl: 2   sulfamethoxazole-trimethoprim (BACTRIM DS) 800-160 MG tablet, Take 1 tablet by mouth 2 (two) times daily., Disp: , Rfl:   Physical exam:  Vitals:   01/29/24 0832  Weight: 167 lb 11.2 oz (76.1 kg)   Physical Exam Cardiovascular:     Rate and Rhythm: Normal rate and regular rhythm.  Heart sounds: Normal heart sounds.  Pulmonary:     Effort: Pulmonary effort is normal.     Breath sounds: Normal breath sounds.  Abdominal:     General: Bowel sounds are normal.     Palpations: Abdomen is soft.  Skin:    General: Skin is warm and dry.  Neurological:     Mental Status: He is alert and oriented to person, place, and time.         Latest Ref Rng & Units 01/12/2024    9:43 AM  CMP  Glucose 70 - 99 mg/dL 841   BUN 8 - 23 mg/dL 12   Creatinine 3.24 - 1.24 mg/dL 4.01   Sodium 027 - 253 mmol/L 136   Potassium 3.5 - 5.1 mmol/L 4.0   Chloride 98 - 111 mmol/L 105   CO2 22 - 32 mmol/L 21   Calcium 8.9 - 10.3 mg/dL 8.2   Total Protein 6.5 - 8.1 g/dL 6.1   Total Bilirubin 0.0 - 1.2 mg/dL 0.3   Alkaline Phos 38 - 126 U/L 86   AST 15 - 41 U/L 20   ALT 0 - 44 U/L 17       Latest Ref Rng & Units 01/29/2024    8:01 AM  CBC  WBC 4.0 - 10.5 K/uL 14.9   Hemoglobin 13.0 - 17.0 g/dL 66.4    Hematocrit 40.3 - 52.0 % 39.9   Platelets 150 - 400 K/uL 340     No images are attached to the encounter.  NM Cardiac Muga Rest Result Date: 01/02/2024 CLINICAL DATA:  Follicular lymphoma.  Assess cardiac function EXAM: NUCLEAR MEDICINE CARDIAC BLOOD POOL IMAGING (MUGA) TECHNIQUE: Cardiac multi-gated acquisition was performed at rest following intravenous injection of Tc-36m labeled red blood cells. RADIOPHARMACEUTICALS:  20.2 mCi Tc-64m pertechnetate in-vitro labeled red blood cells IV COMPARISON:  None Available. FINDINGS: No  focal wall motion abnormality of the left ventricle. Calculated left ventricular ejection fraction equals 53.2% IMPRESSION: Left ventricular ejection fraction equals53.2 %. Electronically Signed   By: Genevive Bi M.D.   On: 01/02/2024 16:11     Assessment and plan- Patient is a 63 y.o. 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  He is here for on treatment assessment prior to cycle 2 of dose adjusted R-EPOCH chemotherapy  Patient received Garlon Hatchet CHP chemotherapy for cycle 1.  Double hit testing is suggestive of double hit lymphoma given the positive MYC gene rearrangement although the Bcl-2 BCL6 partner was not a known 1 and initially classified as a pseudo double hit.  However given the fact that he has a transformed diffuse large B-cell lymphoma from pre-existing follicular lymphoma this is being categorized as a double hit diffuse large B-cell lymphoma.  We are therefore planning to give him dose adjusted R-EPOCH chemotherapy as an outpatient for the remainder of 5 cycles.  Counts okay to proceed with dose adjusted R-EPOCH chemotherapy today.  He will be coming every day for pump disconnect and to get a new pump attached. He receives Rituxan today and will be getting doxorubicin and vincristine in his pump today.  He will be receiving Cytoxan finally on day 5 and come back again next week for growth factor support.  We will be  monitoring CBC with differential twice a week every week and based on his ANC nadir we will decide if we are able to titrate the dose of chemotherapy up for subsequent cycles.  Patient will  also take 5 doses of prednisone 100 mg daily starting today.  I will see him back in 2 weeks and see how he is tolerating treatment so far.  Patient and his daughter comprehend my plan well.  Neutropenic precautions reviewed with the patient and he will be calling us if he has any signs and symptoms of infection   Visit Diagnosis 1. Diffuse large B-cell lymphoma of intra-abdominal lymph nodes (HCC)   2. Encounter for antineoplastic chemotherapy   3. Encounter for monitoring rituximab therapy      Dr. Owens Shark, MD, MPH [facility] [phone] 01/29/2024 8:35 AM

## 2024-01-30 ENCOUNTER — Inpatient Hospital Stay: Payer: 59

## 2024-01-30 ENCOUNTER — Encounter: Payer: Self-pay | Admitting: Oncology

## 2024-01-30 ENCOUNTER — Ambulatory Visit: Payer: 59

## 2024-01-30 VITALS — BP 124/69 | HR 82 | Temp 97.9°F | Resp 16

## 2024-01-30 DIAGNOSIS — Z5112 Encounter for antineoplastic immunotherapy: Secondary | ICD-10-CM | POA: Diagnosis not present

## 2024-01-30 DIAGNOSIS — C8333 Diffuse large B-cell lymphoma, intra-abdominal lymph nodes: Secondary | ICD-10-CM

## 2024-01-30 MED ORDER — SODIUM CHLORIDE 0.9% FLUSH
10.0000 mL | INTRAVENOUS | Status: AC | PRN
Start: 2024-01-30 — End: ?
  Administered 2024-01-30: 10 mL
  Filled 2024-01-30: qty 10

## 2024-01-30 MED ORDER — VINCRISTINE SULFATE CHEMO INJECTION 1 MG/ML
Freq: Once | INTRAVENOUS | Status: AC
  Filled 2024-01-30: qty 9

## 2024-01-31 ENCOUNTER — Ambulatory Visit: Payer: 59

## 2024-01-31 ENCOUNTER — Inpatient Hospital Stay: Payer: 59

## 2024-01-31 VITALS — BP 119/83 | HR 82 | Temp 97.5°F | Resp 16

## 2024-01-31 DIAGNOSIS — Z5112 Encounter for antineoplastic immunotherapy: Secondary | ICD-10-CM | POA: Diagnosis not present

## 2024-01-31 DIAGNOSIS — C8333 Diffuse large B-cell lymphoma, intra-abdominal lymph nodes: Secondary | ICD-10-CM

## 2024-01-31 MED ORDER — VINCRISTINE SULFATE CHEMO INJECTION 1 MG/ML
Freq: Once | INTRAVENOUS | Status: AC
  Filled 2024-01-31: qty 9

## 2024-01-31 MED ORDER — PALONOSETRON HCL INJECTION 0.25 MG/5ML
0.2500 mg | Freq: Once | INTRAVENOUS | Status: AC
  Administered 2024-01-31: 0.25 mg via INTRAVENOUS
  Filled 2024-01-31: qty 5

## 2024-01-31 NOTE — Patient Instructions (Signed)
 Instrucciones al darle de alta: Discharge Instructions Gracias por elegir al [facility] de Cncer de [place] para brindarle atencin mdica de oncologa y Teacher, English as a foreign language.   Si usted tiene una cita de laboratorio con [employer] de [place], por favor vaya directamente al [employer] de Cncer y regstrese en el rea de Engineer, maintenance (IT).   Use ropa cmoda y [place] para tener fcil acceso a las vas del Portacath (acceso venoso de Set designer duracin) o la lnea PICC (catter central colocado por va perifrica).   Nos esforzamos por ofrecerle tiempo de calidad con su proveedor. Es posible que tenga que volver a programar su cita si llega tarde (15 minutos o ms).  El llegar tarde le afecta a usted y a otros pacientes cuyas citas son posteriores a Armed forces operational officer.  Adems, si usted falta a tres o ms citas sin avisar a la oficina, puede ser retirado(a) de la clnica a discrecin del proveedor.      Para las solicitudes de renovacin de recetas, pida a su farmacia que se ponga en contacto con nuestra oficina y deje que transcurran 72 horas para que se complete el proceso de las renovaciones.    Hoy usted recibi los siguientes agentes de quimioterapia e/o inmunoterapia Adriamycin, Vepesid, Oncovin      Para ayudar a prevenir las nuseas y los vmitos despus de su tratamiento, le recomendamos que tome su medicamento para las nuseas segn las indicaciones.  LOS SNTOMAS QUE DEBEN COMUNICARSE INMEDIATAMENTE SE INDICAN A CONTINUACIN: *FIEBRE SUPERIOR A 100.4 F (38 C) O MS *ESCALOFROS O SUDORACIN *NUSEAS Y VMITOS QUE NO SE CONTROLAN CON EL MEDICAMENTO PARA LAS NUSEAS *DIFICULTAD INUSUAL PARA RESPIRAR  *MORETONES O HEMORRAGIAS NO HABITUALES *PROBLEMAS URINARIOS (dolor o ardor al Geographical information systems officer o frecuencia para Geographical information systems officer) *PROBLEMAS INTESTINALES (diarrea inusual, estreimiento, dolor cerca del ano) SENSIBILIDAD EN LA BOCA Y EN LA GARGANTA CON O SIN LA PRESENCIA DE LCERAS (dolor de garganta, llagas en la boca o dolor de  muelas/dientes) ERUPCIN, HINCHAZN O DOLORES INUSUALES FLUJO VAGINAL INUSUAL O PICAZN/RASQUIA    Los puntos marcados con un asterisco ( *) indican una posible emergencia y debe hacer un seguimiento tan pronto como le sea posible o vaya al Departamento de Emergencias si se le presenta algn problema.  Por favor, muestre la [place] DE ADVERTENCIA DE Marc Morgans DE ADVERTENCIA DE [place] Fanti al registrarse en [address] de Emergencias y a la enfermera de triaje.  Si tiene preguntas despus de su visita o necesita cancelar o volver a programar su cita, por favor pngase en contacto con [facility] - A DEPT OF Eligha Bridegroom [facility]  [phone]  y siga las instrucciones. Las horas de oficina son de 8:00 a.m. a 4:30 p.m. de lunes a viernes. Por favor, tenga en cuenta que los mensajes de voz que se dejan despus de las 4:00 p.m. posiblemente no se devolvern hasta el siguiente da de [place].  Cerramos los fines de semana y [employer]. En todo momento tiene acceso a una enfermera para preguntas urgentes. Por favor, llame al nmero principal de la clnica  [phone] y siga las instrucciones.   Para cualquier pregunta que no sea de carcter urgente, tambin puede ponerse en contacto con su proveedor [employer]. Ahora ofrecemos visitas electrnicas para cualquier persona mayor de 18 aos que solicite atencin mdica en lnea para los sntomas que no sean urgentes. Para ms detalles vaya a [email or web address].   Tambin puede bajar la aplicacin de MyChart! Laurena Bering  a la tienda de aplicaciones, busque "MyChart", abra la aplicacin, seleccione [place], e ingrese con su nombre de usuario y la contrasea de Clinical cytogeneticist.

## 2024-02-01 ENCOUNTER — Inpatient Hospital Stay: Payer: 59

## 2024-02-01 ENCOUNTER — Ambulatory Visit: Payer: 59

## 2024-02-01 VITALS — BP 148/82 | HR 95 | Temp 98.4°F | Resp 18

## 2024-02-01 DIAGNOSIS — C8333 Diffuse large B-cell lymphoma, intra-abdominal lymph nodes: Secondary | ICD-10-CM

## 2024-02-01 DIAGNOSIS — Z5112 Encounter for antineoplastic immunotherapy: Secondary | ICD-10-CM | POA: Diagnosis not present

## 2024-02-01 MED ORDER — VINCRISTINE SULFATE CHEMO INJECTION 1 MG/ML
Freq: Once | INTRAVENOUS | Status: AC
  Filled 2024-02-01: qty 9

## 2024-02-02 ENCOUNTER — Inpatient Hospital Stay: Payer: 59

## 2024-02-02 ENCOUNTER — Ambulatory Visit: Payer: 59

## 2024-02-02 ENCOUNTER — Inpatient Hospital Stay

## 2024-02-02 VITALS — BP 152/95 | HR 88 | Temp 97.2°F | Resp 19

## 2024-02-02 DIAGNOSIS — Z5112 Encounter for antineoplastic immunotherapy: Secondary | ICD-10-CM | POA: Diagnosis not present

## 2024-02-02 DIAGNOSIS — C8333 Diffuse large B-cell lymphoma, intra-abdominal lymph nodes: Secondary | ICD-10-CM

## 2024-02-02 MED ORDER — PALONOSETRON HCL INJECTION 0.25 MG/5ML
0.2500 mg | Freq: Once | INTRAVENOUS | Status: AC
  Administered 2024-02-02: 0.25 mg via INTRAVENOUS
  Filled 2024-02-02: qty 5

## 2024-02-02 MED ORDER — SODIUM CHLORIDE 0.9 % IV SOLN
INTRAVENOUS | Status: DC
  Filled 2024-02-02: qty 250

## 2024-02-02 MED ORDER — SODIUM CHLORIDE 0.9 % IV SOLN
750.0000 mg/m2 | Freq: Once | INTRAVENOUS | Status: AC
  Administered 2024-02-02: 1500 mg via INTRAVENOUS
  Filled 2024-02-02: qty 75

## 2024-02-02 MED ORDER — HEPARIN SOD (PORK) LOCK FLUSH 100 UNIT/ML IV SOLN
500.0000 [IU] | Freq: Once | INTRAVENOUS | Status: AC | PRN
  Administered 2024-02-02: 500 [IU]
  Filled 2024-02-02: qty 5

## 2024-02-02 NOTE — Patient Instructions (Signed)
 Instrucciones al darle de alta: Discharge Instructions Gracias por elegir al [facility] de Cncer de [place] para brindarle atencin mdica de oncologa y Teacher, English as a foreign language.   Si usted tiene una cita de laboratorio con [employer] de [place], por favor vaya directamente al [employer] de Cncer y regstrese en el rea de Engineer, maintenance (IT).   Use ropa cmoda y [place] para tener fcil acceso a las vas del Portacath (acceso venoso de Set designer duracin) o la lnea PICC (catter central colocado por va perifrica).   Nos esforzamos por ofrecerle tiempo de calidad con su proveedor. Es posible que tenga que volver a programar su cita si llega tarde (15 minutos o ms).  El llegar tarde le afecta a usted y a otros pacientes cuyas citas son posteriores a Armed forces operational officer.  Adems, si usted falta a tres o ms citas sin avisar a la oficina, puede ser retirado(a) de la clnica a discrecin del proveedor.      Para las solicitudes de renovacin de recetas, pida a su farmacia que se ponga en contacto con nuestra oficina y deje que transcurran 72 horas para que se complete el proceso de las renovaciones.     Para ayudar a prevenir las nuseas y los vmitos despus de su tratamiento, le recomendamos que tome su medicamento para las nuseas segn las indicaciones.  LOS SNTOMAS QUE DEBEN COMUNICARSE INMEDIATAMENTE SE INDICAN A CONTINUACIN: *FIEBRE SUPERIOR A 100.4 F (38 C) O MS *ESCALOFROS O SUDORACIN *NUSEAS Y VMITOS QUE NO SE CONTROLAN CON EL MEDICAMENTO PARA LAS NUSEAS *DIFICULTAD INUSUAL PARA RESPIRAR  *MORETONES O HEMORRAGIAS NO HABITUALES *PROBLEMAS URINARIOS (dolor o ardor al Geographical information systems officer o frecuencia para Geographical information systems officer) *PROBLEMAS INTESTINALES (diarrea inusual, estreimiento, dolor cerca del ano) SENSIBILIDAD EN LA BOCA Y EN LA GARGANTA CON O SIN LA PRESENCIA DE LCERAS (dolor de garganta, llagas en la boca o dolor de muelas/dientes) ERUPCIN, HINCHAZN O DOLORES INUSUALES FLUJO VAGINAL INUSUAL O PICAZN/RASQUIA    Los puntos marcados  con un asterisco ( *) indican una posible emergencia y debe hacer un seguimiento tan pronto como le sea posible o vaya al Departamento de Emergencias si se le presenta algn problema.  Por favor, muestre la [place] DE ADVERTENCIA DE Marc Morgans DE ADVERTENCIA DE Gard[place] Fanti al registrarse en [address] de Emergencias y a la enfermera de triaje.  Si tiene preguntas despus de su visita o necesita cancelar o volver a programar su cita, por favor pngase en contacto con [facility] - A DEPT OF [facility]igha Bridegroom [facility]  [phone]  y siga las instrucciones. Las horas de oficina son de 8:00 a.m. a 4:30 p.m. de lunes a viernes. Por favor, tenga en cuenta que los mensajes de voz que se dejan despus de las 4:00 p.m. posiblemente no se devolvern hasta el siguiente da de [place].  Cerramos los fines de semana y [employer]. En todo momento tiene acceso a una enfermera para preguntas urgentes. Por favor, llame al nmero principal de la clnica  [phone] y siga las instrucciones.   Para cualquier pregunta que no sea de carcter urgente, tambin puede ponerse en contacto con su proveedor [employer]. Ahora ofrecemos visitas electrnicas para cualquier persona mayor de 18 aos que solicite atencin mdica en lnea para los sntomas que no sean urgentes. Para ms detalles vaya a [email or web address].   Tambin puede bajar la aplicacin de MyChart! Vaya a la tienda de aplicaciones, busque "MyChart", abra la aplicacin, seleccione , e ingrese con su  nombre de usuario y la contrasea de Clinical cytogeneticist.

## 2024-02-05 ENCOUNTER — Inpatient Hospital Stay: Payer: 59

## 2024-02-05 ENCOUNTER — Inpatient Hospital Stay

## 2024-02-05 DIAGNOSIS — C8333 Diffuse large B-cell lymphoma, intra-abdominal lymph nodes: Secondary | ICD-10-CM

## 2024-02-05 DIAGNOSIS — Z5112 Encounter for antineoplastic immunotherapy: Secondary | ICD-10-CM | POA: Diagnosis not present

## 2024-02-05 LAB — BASIC METABOLIC PANEL - CANCER CENTER ONLY
Anion gap: 8 (ref 5–15)
BUN: 18 mg/dL (ref 8–23)
CO2: 26 mmol/L (ref 22–32)
Calcium: 8.8 mg/dL — ABNORMAL LOW (ref 8.9–10.3)
Chloride: 102 mmol/L (ref 98–111)
Creatinine: 0.85 mg/dL (ref 0.61–1.24)
GFR, Estimated: >60 mL/min (ref >60)
Glucose, Bld: 96 mg/dL (ref 70–99)
Potassium: 3.6 mmol/L (ref 3.5–5.1)
Sodium: 136 mmol/L (ref 135–145)

## 2024-02-05 LAB — CBC WITH DIFFERENTIAL (CANCER CENTER ONLY)
Abs Immature Granulocytes: 0.07 10*3/uL (ref 0.00–0.07)
Basophils Absolute: 0 10*3/uL (ref 0.0–0.1)
Basophils Relative: 0 %
Eosinophils Absolute: 0.3 10*3/uL (ref 0.0–0.5)
Eosinophils Relative: 3 %
HCT: 38.2 % — ABNORMAL LOW (ref 39.0–52.0)
Hemoglobin: 12.9 g/dL — ABNORMAL LOW (ref 13.0–17.0)
Immature Granulocytes: 1 %
Lymphocytes Relative: 11 %
Lymphs Abs: 1.1 10*3/uL (ref 0.7–4.0)
MCH: 29 pg (ref 26.0–34.0)
MCHC: 33.8 g/dL (ref 30.0–36.0)
MCV: 85.8 fL (ref 80.0–100.0)
Monocytes Absolute: 0.1 10*3/uL (ref 0.1–1.0)
Monocytes Relative: 1 %
Neutro Abs: 8.2 10*3/uL — ABNORMAL HIGH (ref 1.7–7.7)
Neutrophils Relative %: 84 %
Platelet Count: 238 10*3/uL (ref 150–400)
RBC: 4.45 MIL/uL (ref 4.22–5.81)
RDW: 14.6 % (ref 11.5–15.5)
WBC Count: 9.8 10*3/uL (ref 4.0–10.5)
nRBC: 0 % (ref 0.0–0.2)

## 2024-02-05 MED ORDER — PEGFILGRASTIM-CBQV 6 MG/0.6ML ~~LOC~~ SOSY
6.0000 mg | PREFILLED_SYRINGE | Freq: Once | SUBCUTANEOUS | Status: AC
  Administered 2024-02-05: 6 mg via SUBCUTANEOUS
  Filled 2024-02-05: qty 0.6

## 2024-02-09 ENCOUNTER — Inpatient Hospital Stay

## 2024-02-09 DIAGNOSIS — C8333 Diffuse large B-cell lymphoma, intra-abdominal lymph nodes: Secondary | ICD-10-CM

## 2024-02-09 DIAGNOSIS — Z5112 Encounter for antineoplastic immunotherapy: Secondary | ICD-10-CM | POA: Diagnosis not present

## 2024-02-09 LAB — CBC WITH DIFFERENTIAL (CANCER CENTER ONLY)
Abs Immature Granulocytes: 0.42 10*3/uL — ABNORMAL HIGH (ref 0.00–0.07)
Basophils Absolute: 0.1 10*3/uL (ref 0.0–0.1)
Basophils Relative: 2 %
Eosinophils Absolute: 0.3 10*3/uL (ref 0.0–0.5)
Eosinophils Relative: 5 %
HCT: 37.7 % — ABNORMAL LOW (ref 39.0–52.0)
Hemoglobin: 12.7 g/dL — ABNORMAL LOW (ref 13.0–17.0)
Immature Granulocytes: 7 %
Lymphocytes Relative: 20 %
Lymphs Abs: 1.2 10*3/uL (ref 0.7–4.0)
MCH: 29.6 pg (ref 26.0–34.0)
MCHC: 33.7 g/dL (ref 30.0–36.0)
MCV: 87.9 fL (ref 80.0–100.0)
Monocytes Absolute: 0.7 10*3/uL (ref 0.1–1.0)
Monocytes Relative: 13 %
Neutro Abs: 3.2 10*3/uL (ref 1.7–7.7)
Neutrophils Relative %: 53 %
Platelet Count: 162 10*3/uL (ref 150–400)
RBC: 4.29 MIL/uL (ref 4.22–5.81)
RDW: 14.7 % (ref 11.5–15.5)
Smear Review: NORMAL
WBC Count: 6 10*3/uL (ref 4.0–10.5)
nRBC: 0 % (ref 0.0–0.2)

## 2024-02-09 LAB — BASIC METABOLIC PANEL - CANCER CENTER ONLY
Anion gap: 9 (ref 5–15)
BUN: 9 mg/dL (ref 8–23)
CO2: 24 mmol/L (ref 22–32)
Calcium: 8.9 mg/dL (ref 8.9–10.3)
Chloride: 102 mmol/L (ref 98–111)
Creatinine: 0.95 mg/dL (ref 0.61–1.24)
GFR, Estimated: >60 mL/min (ref >60)
Glucose, Bld: 119 mg/dL — ABNORMAL HIGH (ref 70–99)
Potassium: 3.9 mmol/L (ref 3.5–5.1)
Sodium: 135 mmol/L (ref 135–145)

## 2024-02-13 ENCOUNTER — Encounter: Payer: Self-pay | Admitting: Oncology

## 2024-02-13 ENCOUNTER — Inpatient Hospital Stay

## 2024-02-13 ENCOUNTER — Inpatient Hospital Stay (HOSPITAL_BASED_OUTPATIENT_CLINIC_OR_DEPARTMENT_OTHER): Admitting: Oncology

## 2024-02-13 ENCOUNTER — Other Ambulatory Visit: Payer: Self-pay | Admitting: *Deleted

## 2024-02-13 VITALS — BP 133/86 | HR 76 | Temp 97.7°F | Resp 18 | Ht 64.0 in | Wt 168.8 lb

## 2024-02-13 DIAGNOSIS — C8333 Diffuse large B-cell lymphoma, intra-abdominal lymph nodes: Secondary | ICD-10-CM

## 2024-02-13 DIAGNOSIS — Z5112 Encounter for antineoplastic immunotherapy: Secondary | ICD-10-CM | POA: Diagnosis not present

## 2024-02-13 LAB — BASIC METABOLIC PANEL - CANCER CENTER ONLY
Anion gap: 10 (ref 5–15)
BUN: 11 mg/dL (ref 8–23)
CO2: 23 mmol/L (ref 22–32)
Calcium: 8.5 mg/dL — ABNORMAL LOW (ref 8.9–10.3)
Chloride: 103 mmol/L (ref 98–111)
Creatinine: 1.11 mg/dL (ref 0.61–1.24)
GFR, Estimated: >60 mL/min (ref >60)
Glucose, Bld: 115 mg/dL — ABNORMAL HIGH (ref 70–99)
Potassium: 3.6 mmol/L (ref 3.5–5.1)
Sodium: 136 mmol/L (ref 135–145)

## 2024-02-13 LAB — CBC WITH DIFFERENTIAL (CANCER CENTER ONLY)
Abs Immature Granulocytes: 1 10*3/uL — ABNORMAL HIGH (ref 0.00–0.07)
Band Neutrophils: 1 %
Basophils Absolute: 0 10*3/uL (ref 0.0–0.1)
Basophils Relative: 0 %
Eosinophils Absolute: 0.4 10*3/uL (ref 0.0–0.5)
Eosinophils Relative: 2 %
HCT: 38 % — ABNORMAL LOW (ref 39.0–52.0)
Hemoglobin: 12.7 g/dL — ABNORMAL LOW (ref 13.0–17.0)
Lymphocytes Relative: 15 %
Lymphs Abs: 3 10*3/uL (ref 0.7–4.0)
MCH: 29.6 pg (ref 26.0–34.0)
MCHC: 33.4 g/dL (ref 30.0–36.0)
MCV: 88.6 fL (ref 80.0–100.0)
Metamyelocytes Relative: 3 %
Monocytes Absolute: 1 10*3/uL (ref 0.1–1.0)
Monocytes Relative: 5 %
Myelocytes: 2 %
Neutro Abs: 14.5 10*3/uL — ABNORMAL HIGH (ref 1.7–7.7)
Neutrophils Relative %: 72 %
Platelet Count: 135 10*3/uL — ABNORMAL LOW (ref 150–400)
RBC: 4.29 MIL/uL (ref 4.22–5.81)
RDW: 15.9 % — ABNORMAL HIGH (ref 11.5–15.5)
Smear Review: NORMAL
WBC Count: 19.9 10*3/uL — ABNORMAL HIGH (ref 4.0–10.5)
nRBC: 0.5 % — ABNORMAL HIGH (ref 0.0–0.2)

## 2024-02-14 ENCOUNTER — Ambulatory Visit: Payer: 59

## 2024-02-14 ENCOUNTER — Other Ambulatory Visit: Payer: 59

## 2024-02-14 ENCOUNTER — Other Ambulatory Visit: Payer: Self-pay

## 2024-02-14 ENCOUNTER — Ambulatory Visit: Payer: 59 | Admitting: Oncology

## 2024-02-15 ENCOUNTER — Encounter: Payer: Self-pay | Admitting: Oncology

## 2024-02-15 NOTE — Progress Notes (Signed)
 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  He had a CT abdomen and pelvis with contrast which showed large soft tissue mass along the mesentery measuring up to 21 cm with encasement of central mesenteric vessels and loss of tissue planes between the mass and several bowel loops.  Caliber change along the third portion of duodenum with partial mild obstruction.  Findings concerning for lymphoma.  Serum creatinine mildly elevated at 1.4.  Spleen slightly enlarged without intrinsic splenic mass.  Patient denies any significant nausea or vomiting.  He has early satiety   PET CT scan from 12/22/2023 showed Hypermetabolic left supraclavicular mediastinal retroperitoneal nodes along with large central mesenteric nodal mass consistent with lymphoma.  SUV in these areas ranging around SUV 16.  Central mesenteric nodal mass extending into the upper pelvis.  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  Cells positive for CD20 with coexpression of CD10 and BCL6 consistent with germinal center/folic killer phenotype and aberrantly express Bcl-2 strongly.  MOM1 and CD30 are negative.  EBV ISH negative.   Proliferation ranges from 20 to 80% with overall average of 60 to 70%.  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      Patient received Garlon Hatchet [facility] chemotherapy for cycle 1.  Double hit testing showed make gene rearrangement at 75%. Although Bcl-2 and BCL6 rearrangement was detected on FISH these were not known partners to make and therefore it was reported as a pseudo double hit.  However given the fact that this is a transformed diffuse large B-cell lymphoma from a pre-existing follicular lymphoma this would fit the pattern of a double hit lymphoma/ high grade B cell lymphoma.  Plan was therefore made to switch him to dose adjusted R-EPOCH chemotherapy for the remainder of the cycles      Interval history-patient tolerated cycle 1 of chemotherapy well without any significant side effects.  He reports no nausea vomiting diarrhea or bleeding  ECOG PS- 0 Pain scale- 0   Review of systems- Review of Systems  Constitutional:  Negative for chills, fever, malaise/fatigue and weight loss.  HENT:  Negative for congestion, ear discharge and nosebleeds.   Eyes:  Negative for blurred vision.  Respiratory:  Negative for cough, hemoptysis, sputum production, shortness of breath and wheezing.   Cardiovascular:  Negative for chest pain, palpitations, orthopnea and claudication.  Gastrointestinal:  Negative for abdominal pain, blood in stool, constipation, diarrhea, heartburn, melena, nausea and vomiting.  Genitourinary:  Negative for dysuria, flank pain, frequency, hematuria and urgency.  Musculoskeletal:  Negative for back pain, joint pain and myalgias.  Skin:  Negative for rash.  Neurological:  Negative for dizziness, tingling, focal weakness, seizures, weakness and headaches.  Endo/Heme/Allergies:  Does not  bruise/bleed easily.  Psychiatric/Behavioral:  Negative for depression and suicidal ideas. The patient does not have insomnia.       No Known Allergies   History reviewed. No pertinent past medical history.   Past Surgical History:  Procedure Laterality Date   FISTULOTOMY N/A 01/10/2018   Procedure: FISTULOTOMY;  Surgeon: Carolan Shiver, MD;  Location: [facility] ORS;  Service: General;  Laterality: N/A;   MASS BIOPSY Left 12/28/2023   Procedure: NECK MASS BIOPSY;  Surgeon: Leafy Ro, MD;  Location: [facility] ORS;  Service: General;  Laterality: Left;   PORTACATH PLACEMENT N/A 12/28/2023   Procedure: INSERTION PORT-A-CATH;  Surgeon: Leafy Ro, MD;  Location: [facility] ORS;  Service: General;  Laterality: N/A;    Social History   Socioeconomic History   Marital status: Single    Spouse name: Not on file   Number of children: 4   Years of education: Not on file   Highest education level: Not on file  Occupational History   Not on file  Tobacco Use   Smoking status: Former    Types: Cigarettes    Passive exposure: Past   Smokeless tobacco: Never  Vaping Use   Vaping status: Never Used  Substance and Sexual Activity   Alcohol use: Not Currently   Drug use: Not Currently    Types: Cocaine    Comment: years ago    Sexual activity: Yes  Other Topics Concern   Not on file  Social History Narrative   Not on file   Social Drivers of Health   Financial Resource Strain: Not on file  Food Insecurity: No Food Insecurity (12/27/2023)   Hunger Vital Sign    Worried About Running Out of Food in the Last Year: Never true    Ran Out of Food in the Last Year: Never true  Transportation Needs: No Transportation Needs (12/27/2023)   PRAPARE - Administrator, Civil Service (Medical): No    Lack of Transportation (Non-Medical): No  Physical Activity: Inactive (12/27/2023)   Exercise Vital Sign    Days of Exercise per Week: 0 days    Minutes of Exercise per Session: 0 min   Stress: Not on file  Social Connections: Not on file  Intimate Partner Violence: Not At Risk (12/27/2023)   Humiliation, Afraid, Rape, and Kick questionnaire    Fear of Current or Ex-Partner: No    Emotionally Abused: No    Physically Abused: No    Sexually Abused: No    Family History  Problem Relation Age of Onset   Colon cancer Mother    Pancreatic cancer Father    Cancer Sister      Current Outpatient Medications:    acyclovir (ZOVIRAX) 400 MG tablet, Take 400 mg by mouth daily., Disp: , Rfl:    HYDROcodone-acetaminophen (NORCO/VICODIN) 5-325 MG tablet, Take 1 tablet by mouth every 6 (six) hours as needed for moderate pain (pain score 4-6)., Disp: 15 tablet, Rfl: 0   predniSONE (DELTASONE) 20 MG tablet, Take 5 tablets (100 mg total) by mouth daily with breakfast., Disp: 35 tablet, Rfl: 2   sulfamethoxazole-trimethoprim (BACTRIM DS) 800-160 MG tablet, Take 1 tablet by mouth 2 (two) times daily., Disp: , Rfl:  No current facility-administered medications for this visit.  Facility-Administered Medications Ordered in Other Visits:    0.9 %  sodium chloride infusion, , Intravenous,  Continuous, Creig Hines, MD, Stopped at 01/29/24 1143   sodium chloride flush (NS) 0.9 % injection 10 mL, 10 mL, Intracatheter, PRN, Creig Hines, MD, 10 mL at 01/30/24 1254  Physical exam:  Vitals:   02/13/24 0951  BP: 133/86  Pulse: 76  Resp: 18  Temp: 97.7 F (36.5 C)  TempSrc: Tympanic  SpO2: 100%  Weight: 168 lb 12.8 oz (76.6 kg)  Height: 5\' 4"  (1.626 m)   Physical Exam Cardiovascular:     Rate and Rhythm: Normal rate and regular rhythm.     Heart sounds: Normal heart sounds.  Pulmonary:     Effort: Pulmonary effort is normal.     Breath sounds: Normal breath sounds.  Abdominal:     General: Bowel sounds are normal.     Palpations: Abdomen is soft.     Comments: Previously palpable intra-abdominal mass no longer palpable  Skin:    General: Skin is warm and dry.   Neurological:     Mental Status: He is alert and oriented to person, place, and time.         Latest Ref Rng & Units 02/13/2024    9:29 AM  CMP  Glucose 70 - 99 mg/dL 784   BUN 8 - 23 mg/dL 11   Creatinine 6.96 - 1.24 mg/dL 2.95   Sodium 284 - 132 mmol/L 136   Potassium 3.5 - 5.1 mmol/L 3.6   Chloride 98 - 111 mmol/L 103   CO2 22 - 32 mmol/L 23   Calcium 8.9 - 10.3 mg/dL 8.5       Latest Ref Rng & Units 02/13/2024    9:29 AM  CBC  WBC 4.0 - 10.5 K/uL 19.9   Hemoglobin 13.0 - 17.0 g/dL 44.0   Hematocrit 10.2 - 52.0 % 38.0   Platelets 150 - 400 K/uL 135     No images are attached to the encounter.  No results found.   Assessment and plan- Patient is a 63 y.o. 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  S/p 1 cycle of dose adjusted R-EPOCH chemotherapy and here for toxicity assessment  Patient's CBC has remained stable over the last 2 weeks after 1 cycle of dose adjusted R-EPOCH chemotherapy and ANC nadir has remained more than 0.5.  Therefore as per dose adjusted R-EPOCH regimen he would qualify for a 20% dose increase in doxorubicin, etoposide and Cytoxan.  He will receive standard doses of Rituxan and vincristine and will continue to take 100 mg of prednisone 5 continuous days as previous treatment  I will see him back in 1 week's time for cycle 2 of dose adjusted R-EPOCH chemotherapy and plan to repeat PET scan 10 days posttreatment.  If he has problems tolerating the higher dose regimen, I will dial down his chemotherapy dose as an cycle 1 of dose adjusted R-EPOCH chemotherapy.  We will continue to monitor his CBCs twice a week every week while he is on this regimen.  He has been able to do it well so far as an outpatient.  Patient did experience some myalgias after receiving Neulasta and it would be okay for patient to take Tylenol for a few days along with Claritin for the same.  His CNS IPI risk comes at 63 for age more than 68,  stage IV disease, extranodal sites of involvement more than 1 which translates into a CNS relapse risk of about 4.7% at 2 years but this was mainly  for patients were treated with R-CHOP chemotherapy.  Risk of CNS relapse would likely be higher with a double hit diffuse large B-cell lymphoma and it would not be unreasonable to consider intrathecal methotrexate prophylaxis for him for the remainder of the 4 cycles.  I will plan to give this a day after he receives Neulasta on day 7.  This is done by interventional radiology at our institution and typically intrathecal methotrexate is well-tolerated and possible side effects include headache.  I will plan to give him hydrocortisone along with methotrexate to reduce the chances of headache and we will also have him take prednisone 20 mg a day prior and 2 days after intrathecal methotrexate.  I have discussed pros and cons of CNS prophylaxis with patient and his daughter with the help of Spanish interpreter and he understands and agrees to proceed as planned.   Visit Diagnosis 1. Diffuse large B-cell lymphoma of intra-abdominal lymph nodes (HCC)      Dr. Owens Shark, MD, MPH [facility] at [facility] [phone] 02/15/2024 1:40 PM

## 2024-02-16 ENCOUNTER — Inpatient Hospital Stay

## 2024-02-16 ENCOUNTER — Encounter: Payer: Self-pay | Admitting: Oncology

## 2024-02-16 ENCOUNTER — Ambulatory Visit: Payer: 59

## 2024-02-16 DIAGNOSIS — Z5112 Encounter for antineoplastic immunotherapy: Secondary | ICD-10-CM | POA: Diagnosis not present

## 2024-02-16 DIAGNOSIS — C8333 Diffuse large B-cell lymphoma, intra-abdominal lymph nodes: Secondary | ICD-10-CM

## 2024-02-16 LAB — CMP (CANCER CENTER ONLY)
ALT: 22 U/L (ref 0–44)
AST: 18 U/L (ref 15–41)
Albumin: 4.2 g/dL (ref 3.5–5.0)
Alkaline Phosphatase: 94 U/L (ref 38–126)
Anion gap: 11 (ref 5–15)
BUN: 14 mg/dL (ref 8–23)
CO2: 22 mmol/L (ref 22–32)
Calcium: 8.7 mg/dL — ABNORMAL LOW (ref 8.9–10.3)
Chloride: 103 mmol/L (ref 98–111)
Creatinine: 1.06 mg/dL (ref 0.61–1.24)
GFR, Estimated: >60 mL/min (ref >60)
Glucose, Bld: 110 mg/dL — ABNORMAL HIGH (ref 70–99)
Potassium: 3.9 mmol/L (ref 3.5–5.1)
Sodium: 136 mmol/L (ref 135–145)
Total Bilirubin: 0.6 mg/dL (ref 0.0–1.2)
Total Protein: 6.6 g/dL (ref 6.5–8.1)

## 2024-02-16 LAB — CBC WITH DIFFERENTIAL/PLATELET
Abs Immature Granulocytes: 1.47 10*3/uL — ABNORMAL HIGH (ref 0.00–0.07)
Basophils Absolute: 0.2 10*3/uL — ABNORMAL HIGH (ref 0.0–0.1)
Basophils Relative: 1 %
Eosinophils Absolute: 0.1 10*3/uL (ref 0.0–0.5)
Eosinophils Relative: 0 %
HCT: 38.7 % — ABNORMAL LOW (ref 39.0–52.0)
Hemoglobin: 12.8 g/dL — ABNORMAL LOW (ref 13.0–17.0)
Immature Granulocytes: 9 %
Lymphocytes Relative: 9 %
Lymphs Abs: 1.5 10*3/uL (ref 0.7–4.0)
MCH: 29.2 pg (ref 26.0–34.0)
MCHC: 33.1 g/dL (ref 30.0–36.0)
MCV: 88.4 fL (ref 80.0–100.0)
Monocytes Absolute: 0.8 10*3/uL (ref 0.1–1.0)
Monocytes Relative: 5 %
Neutro Abs: 12.1 10*3/uL — ABNORMAL HIGH (ref 1.7–7.7)
Neutrophils Relative %: 76 %
Platelets: 147 10*3/uL — ABNORMAL LOW (ref 150–400)
RBC: 4.38 MIL/uL (ref 4.22–5.81)
RDW: 16 % — ABNORMAL HIGH (ref 11.5–15.5)
Smear Review: NORMAL
WBC: 16.1 10*3/uL — ABNORMAL HIGH (ref 4.0–10.5)
nRBC: 0.1 % (ref 0.0–0.2)

## 2024-02-19 ENCOUNTER — Encounter: Payer: Self-pay | Admitting: Oncology

## 2024-02-19 ENCOUNTER — Inpatient Hospital Stay: Payer: 59

## 2024-02-19 ENCOUNTER — Inpatient Hospital Stay (HOSPITAL_BASED_OUTPATIENT_CLINIC_OR_DEPARTMENT_OTHER): Payer: 59 | Admitting: Oncology

## 2024-02-19 VITALS — BP 108/67 | HR 68

## 2024-02-19 VITALS — BP 112/75 | HR 84 | Temp 97.6°F | Wt 165.0 lb

## 2024-02-19 DIAGNOSIS — C8333 Diffuse large B-cell lymphoma, intra-abdominal lymph nodes: Secondary | ICD-10-CM

## 2024-02-19 DIAGNOSIS — Z79620 Long term (current) use of immunosuppressive biologic: Secondary | ICD-10-CM | POA: Diagnosis not present

## 2024-02-19 DIAGNOSIS — Z5181 Encounter for therapeutic drug level monitoring: Secondary | ICD-10-CM | POA: Diagnosis not present

## 2024-02-19 DIAGNOSIS — Z5112 Encounter for antineoplastic immunotherapy: Secondary | ICD-10-CM | POA: Diagnosis not present

## 2024-02-19 DIAGNOSIS — Z5111 Encounter for antineoplastic chemotherapy: Secondary | ICD-10-CM

## 2024-02-19 LAB — CBC WITH DIFFERENTIAL (CANCER CENTER ONLY)
Abs Immature Granulocytes: 0.44 10*3/uL — ABNORMAL HIGH (ref 0.00–0.07)
Basophils Absolute: 0.1 10*3/uL (ref 0.0–0.1)
Basophils Relative: 1 %
Eosinophils Absolute: 0 10*3/uL (ref 0.0–0.5)
Eosinophils Relative: 0 %
HCT: 37 % — ABNORMAL LOW (ref 39.0–52.0)
Hemoglobin: 12.5 g/dL — ABNORMAL LOW (ref 13.0–17.0)
Immature Granulocytes: 4 %
Lymphocytes Relative: 9 %
Lymphs Abs: 1.1 10*3/uL (ref 0.7–4.0)
MCH: 29.7 pg (ref 26.0–34.0)
MCHC: 33.8 g/dL (ref 30.0–36.0)
MCV: 87.9 fL (ref 80.0–100.0)
Monocytes Absolute: 1.3 10*3/uL — ABNORMAL HIGH (ref 0.1–1.0)
Monocytes Relative: 11 %
Neutro Abs: 9.2 10*3/uL — ABNORMAL HIGH (ref 1.7–7.7)
Neutrophils Relative %: 75 %
Platelet Count: 169 10*3/uL (ref 150–400)
RBC: 4.21 MIL/uL — ABNORMAL LOW (ref 4.22–5.81)
RDW: 16.4 % — ABNORMAL HIGH (ref 11.5–15.5)
WBC Count: 12.2 10*3/uL — ABNORMAL HIGH (ref 4.0–10.5)
nRBC: 0.2 % (ref 0.0–0.2)

## 2024-02-19 LAB — BASIC METABOLIC PANEL - CANCER CENTER ONLY
Anion gap: 7 (ref 5–15)
BUN: 18 mg/dL (ref 8–23)
CO2: 23 mmol/L (ref 22–32)
Calcium: 8.8 mg/dL — ABNORMAL LOW (ref 8.9–10.3)
Chloride: 105 mmol/L (ref 98–111)
Creatinine: 0.93 mg/dL (ref 0.61–1.24)
GFR, Estimated: >60 mL/min (ref >60)
Glucose, Bld: 104 mg/dL — ABNORMAL HIGH (ref 70–99)
Potassium: 3.9 mmol/L (ref 3.5–5.1)
Sodium: 135 mmol/L (ref 135–145)

## 2024-02-19 MED ORDER — PALONOSETRON HCL INJECTION 0.25 MG/5ML
0.2500 mg | Freq: Once | INTRAVENOUS | Status: AC
  Administered 2024-02-19: 0.25 mg via INTRAVENOUS
  Filled 2024-02-19: qty 5

## 2024-02-19 MED ORDER — PREDNISONE 20 MG PO TABS: 20.0000 mg | ORAL_TABLET | Freq: Every day | ORAL | 0 refills | Status: DC

## 2024-02-19 MED ORDER — RITUXIMAB-PVVR CHEMO 500 MG/50ML IV SOLN
375.0000 mg/m2 | Freq: Once | INTRAVENOUS | Status: AC
  Administered 2024-02-19: 700 mg via INTRAVENOUS
  Filled 2024-02-19: qty 70

## 2024-02-19 MED ORDER — SODIUM CHLORIDE 0.9 % IV SOLN
INTRAVENOUS | Status: DC
  Filled 2024-02-19 (×2): qty 250

## 2024-02-19 MED ORDER — VINCRISTINE SULFATE CHEMO INJECTION 1 MG/ML
Freq: Once | INTRAVENOUS | Status: AC
  Filled 2024-02-19: qty 11

## 2024-02-19 MED ORDER — DIPHENHYDRAMINE HCL 25 MG PO CAPS
50.0000 mg | ORAL_CAPSULE | Freq: Once | ORAL | Status: AC
Start: 2024-02-19 — End: 2024-02-19
  Administered 2024-02-19: 50 mg via ORAL
  Filled 2024-02-19: qty 2

## 2024-02-19 MED ORDER — ACETAMINOPHEN 325 MG PO TABS
650.0000 mg | ORAL_TABLET | Freq: Once | ORAL | Status: AC
Start: 2024-02-19 — End: 2024-02-19
  Administered 2024-02-19: 650 mg via ORAL
  Filled 2024-02-19: qty 2

## 2024-02-19 NOTE — Progress Notes (Signed)
 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  Overall cycle 3.  Patient received Pola or CHP for cycle 1  Heme/Onc history: Patient is a 63 year old Hispanic male with no significant past medical history presented to the ER with symptoms of abdominal pain especially after eating and feeling of abdominal distention.  He had a CT abdomen and pelvis with contrast which showed large soft tissue mass along the mesentery measuring up to 21 cm with encasement of central mesenteric vessels and loss of tissue planes between the mass and several bowel loops.  Caliber change along the third portion of duodenum with partial mild obstruction.  Findings concerning for lymphoma.  Serum creatinine mildly elevated at 1.4.  Spleen slightly enlarged without intrinsic splenic mass.  Patient denies any significant nausea or vomiting.  He has early satiety   PET CT scan from 12/22/2023 showed Hypermetabolic left supraclavicular mediastinal retroperitoneal nodes along with large central mesenteric nodal mass consistent with lymphoma.  SUV in these areas ranging around SUV 16.  Central mesenteric nodal mass extending into the upper pelvis.  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  Cells positive for CD20 with coexpression of CD10 and BCL6 consistent with germinal center/folic killer phenotype and aberrantly  express Bcl-2 strongly.  MOM1 and CD30 are negative.  EBV ISH negative.  Proliferation ranges from 20 to 80% with overall average of 60 to 70%.  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       Patient received Garlon Hatchet [facility] chemotherapy for cycle 1.  Double hit testing showed make gene rearrangement at 75%. Although Bcl-2 and BCL6 rearrangement was detected on FISH these were not known partners to make and therefore it was reported as a pseudo double hit.  However given the fact that this is a transformed diffuse large B-cell lymphoma from a pre-existing follicular lymphoma this would fit the pattern of a double hit lymphoma/ high grade B cell lymphoma.  Plan was therefore made to switch him to dose adjusted R-EPOCH chemotherapy for the remainder of the cycles    Interval history-he is doing well presently and denies any complaints at this time  ECOG PS- 0 Pain scale- 0   Review of systems- Review of Systems  Constitutional:  Negative for chills, fever, malaise/fatigue and weight loss.  HENT:  Negative for congestion, ear discharge and nosebleeds.   Eyes:  Negative for blurred vision.  Respiratory:  Negative for cough, hemoptysis, sputum production, shortness of breath and wheezing.   Cardiovascular:  Negative for chest pain, palpitations, orthopnea and claudication.  Gastrointestinal:  Negative for abdominal pain, blood in stool, constipation, diarrhea, heartburn, melena, nausea and vomiting.  Genitourinary:  Negative for dysuria, flank pain, frequency, hematuria and urgency.  Musculoskeletal:  Negative for back pain, joint pain and myalgias.  Skin:  Negative for rash.  Neurological:  Negative for dizziness, tingling, focal weakness, seizures, weakness and headaches.  Endo/Heme/Allergies:  Does not  bruise/bleed easily.  Psychiatric/Behavioral:  Negative for depression and suicidal ideas. The patient does not have insomnia.       No Known Allergies   History reviewed. No pertinent past medical history.   Past Surgical History:  Procedure Laterality Date   FISTULOTOMY N/A 01/10/2018   Procedure: FISTULOTOMY;  Surgeon: Carolan Shiver, MD;  Location: [facility] ORS;  Service: General;  Laterality: N/A;   MASS BIOPSY Left 12/28/2023   Procedure: NECK MASS BIOPSY;  Surgeon: Leafy Ro, MD;  Location: [facility] ORS;  Service: General;  Laterality: Left;   PORTACATH PLACEMENT N/A 12/28/2023   Procedure: INSERTION PORT-A-CATH;  Surgeon: Leafy Ro, MD;  Location: [facility] ORS;  Service: General;  Laterality: N/A;    Social History   Socioeconomic History   Marital status: Single    Spouse name: Not on file   Number of children: 4   Years of education: Not on file   Highest education level: Not on file  Occupational History   Not on file  Tobacco Use   Smoking status: Former    Types: Cigarettes    Passive exposure: Past   Smokeless tobacco: Never  Vaping Use   Vaping status: Never Used  Substance and Sexual Activity   Alcohol use: Not Currently   Drug use: Not Currently    Types: Cocaine    Comment: years ago    Sexual activity: Yes  Other Topics Concern   Not on file  Social History Narrative   Not on file   Social Drivers of Health   Financial Resource Strain: Not on file  Food Insecurity: No Food Insecurity (12/27/2023)   Hunger Vital Sign    Worried About Running Out of Food in the Last Year: Never true    Ran Out of Food in the Last Year: Never true  Transportation Needs: No Transportation Needs (12/27/2023)   PRAPARE - Administrator, Civil Service (Medical): No    Lack of Transportation (Non-Medical): No  Physical Activity: Inactive (12/27/2023)   Exercise Vital Sign    Days of Exercise per Week: 0 days    Minutes of Exercise per Session: 0 min   Stress: Not on file  Social Connections: Not on file  Intimate Partner Violence: Not At Risk (12/27/2023)   Humiliation, Afraid, Rape, and Kick questionnaire    Fear of Current or Ex-Partner: No    Emotionally Abused: No    Physically Abused: No    Sexually Abused: No    Family History  Problem Relation Age of Onset   Colon cancer Mother    Pancreatic cancer Father    Cancer Sister      Current Outpatient Medications:    acyclovir (ZOVIRAX) 400 MG tablet, Take 400 mg by mouth daily., Disp: , Rfl:    HYDROcodone-acetaminophen (NORCO/VICODIN) 5-325 MG tablet, Take 1 tablet by mouth every 6 (six) hours as needed for moderate pain (pain score 4-6)., Disp: 15 tablet, Rfl: 0   predniSONE (DELTASONE) 20 MG tablet, Take 5 tablets (100 mg total) by mouth daily with breakfast., Disp: 35 tablet, Rfl: 2   [START ON 02/28/2024] predniSONE (DELTASONE) 20 MG tablet, Take 1 tablet (20 mg total) by mouth daily with breakfast., Disp: 2 tablet, Rfl: 0   sulfamethoxazole-trimethoprim (BACTRIM DS) 800-160 MG tablet, Take  1 tablet by mouth 2 (two) times daily., Disp: , Rfl:  No current facility-administered medications for this visit.  Facility-Administered Medications Ordered in Other Visits:    0.9 %  sodium chloride infusion, , Intravenous, Continuous, Creig Hines, MD, Stopped at 01/29/24 1143   0.9 %  sodium chloride infusion, , Intravenous, Continuous, Creig Hines, MD   acetaminophen (TYLENOL) tablet 650 mg, 650 mg, Oral, Once, Creig Hines, MD   diphenhydrAMINE (BENADRYL) capsule 50 mg, 50 mg, Oral, Once, Creig Hines, MD   DOXOrubicin (ADRIAMYCIN) 22 mg, etoposide (VEPESID) 110 mg, vinCRIStine (ONCOVIN) 0.7 mg in sodium chloride 0.9 % 500 mL chemo infusion, , Intravenous, Once, Creig Hines, MD   palonosetron (ALOXI) injection 0.25 mg, 0.25 mg, Intravenous, Once, Creig Hines, MD   riTUXimab-pvvr (RUXIENCE) 700 mg in sodium chloride 0.9 % 180 mL infusion, 375 mg/m2 (Treatment Plan  Recorded), Intravenous, Once, Creig Hines, MD   sodium chloride flush (NS) 0.9 % injection 10 mL, 10 mL, Intracatheter, PRN, Creig Hines, MD, 10 mL at 01/30/24 1254  Physical exam:  Vitals:   02/19/24 0844  BP: 112/75  Pulse: 84  Temp: 97.6 F (36.4 C)  TempSrc: Tympanic  SpO2: 100%  Weight: 165 lb (74.8 kg)   Physical Exam Cardiovascular:     Rate and Rhythm: Normal rate and regular rhythm.     Heart sounds: Normal heart sounds.  Pulmonary:     Effort: Pulmonary effort is normal.     Breath sounds: Normal breath sounds.  Skin:    General: Skin is warm and dry.  Neurological:     Mental Status: He is alert and oriented to person, place, and time.         Latest Ref Rng & Units 02/19/2024    8:34 AM  CMP  Glucose 70 - 99 mg/dL 161   BUN 8 - 23 mg/dL 18   Creatinine 0.96 - 1.24 mg/dL 0.45   Sodium 409 - 811 mmol/L 135   Potassium 3.5 - 5.1 mmol/L 3.9   Chloride 98 - 111 mmol/L 105   CO2 22 - 32 mmol/L 23   Calcium 8.9 - 10.3 mg/dL 8.8       Latest Ref Rng & Units 02/19/2024    8:34 AM  CBC  WBC 4.0 - 10.5 K/uL 12.2   Hemoglobin 13.0 - 17.0 g/dL 91.4   Hematocrit 78.2 - 52.0 % 37.0   Platelets 150 - 400 K/uL 169      Assessment and plan- Patient is a 63 y.o. 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  He is here for on treatment assessment prior to cycle 2 of dose adjusted R-EPOCH chemotherapy.  Patient received Garlon Hatchet [facility] for cycle 1  Counts okay to proceed with cycle 2 of dose adjusted R-EPOCH chemotherapy today which will be given as an outpatient day 1 to day 5.  He will receive Neulasta on day 8.  We are planning to proceed with dose 1 of intrathecal methotrexate for CNS prophylaxis on day 9.  Along with methotrexate patient will also receive intrathecal hydrocortisone to reduce the risk of headache and he will take 2 days of prednisone post procedure if he has headache 20 mg.  Patient also knows to take his regular  100 mg of prednisone for 5 days starting today.  CBC with differential twice a week for the next 2 weeks.  PET scan in 1  week.  I will see him back in 10 days.  So far patient is tolerating treatments well so far without any significant side effects.  His ANC remains more than 0.5 with the last cycle and therefore he will be getting 20% dose increase of etoposide doxorubicin and Cytoxan with this cycle.   Visit Diagnosis 1. Diffuse large B-cell lymphoma of intra-abdominal lymph nodes (HCC)   2. Encounter for antineoplastic chemotherapy   3. Encounter for monitoring rituximab therapy      Dr. Owens Shark, MD, MPH [facility] [phone] 02/19/2024 9:32 AM

## 2024-02-19 NOTE — Patient Instructions (Signed)
 Instrucciones al darle de alta: Discharge Instructions Gracias por elegir al [facility] de Cncer de [place] para brindarle atencin mdica de oncologa y Teacher, English as a foreign language.   Si usted tiene una cita de laboratorio con [employer] de [place], por favor vaya directamente al [employer] de Cncer y regstrese en el rea de Engineer, maintenance (IT).   Use ropa cmoda y [place] para tener fcil acceso a las vas del Portacath (acceso venoso de Set designer duracin) o la lnea PICC (catter central colocado por va perifrica).   Nos esforzamos por ofrecerle tiempo de calidad con su proveedor. Es posible que tenga que volver a programar su cita si llega tarde (15 minutos o ms).  El llegar tarde le afecta a usted y a otros pacientes cuyas citas son posteriores a Armed forces operational officer.  Adems, si usted falta a tres o ms citas sin avisar a la oficina, puede ser retirado(a) de la clnica a discrecin del proveedor.      Para las solicitudes de renovacin de recetas, pida a su farmacia que se ponga en contacto con nuestra oficina y deje que transcurran 72 horas para que se complete el proceso de las renovaciones.    Para ayudar a prevenir las nuseas y los vmitos despus de su tratamiento, le recomendamos que tome su medicamento para las nuseas segn las indicaciones.  LOS SNTOMAS QUE DEBEN COMUNICARSE INMEDIATAMENTE SE INDICAN A CONTINUACIN: *FIEBRE SUPERIOR A 100.4 F (38 C) O MS *ESCALOFROS O SUDORACIN *NUSEAS Y VMITOS QUE NO SE CONTROLAN CON EL MEDICAMENTO PARA LAS NUSEAS *DIFICULTAD INUSUAL PARA RESPIRAR  *MORETONES O HEMORRAGIAS NO HABITUALES *PROBLEMAS URINARIOS (dolor o ardor al Geographical information systems officer o frecuencia para Geographical information systems officer) *PROBLEMAS INTESTINALES (diarrea inusual, estreimiento, dolor cerca del ano) SENSIBILIDAD EN LA BOCA Y EN LA GARGANTA CON O SIN LA PRESENCIA DE LCERAS (dolor de garganta, llagas en la boca o dolor de muelas/dientes) ERUPCIN, HINCHAZN O DOLORES INUSUALES  Los puntos marcados con un asterisco ( *) indican una posible  emergencia y debe hacer un seguimiento tan pronto como le sea posible o vaya al Departamento de Emergencias si se le presenta algn problema.  Por favor, muestre la [place] DE ADVERTENCIA DE Marc Morgans DE ADVERTENCIA DE [place] Fanti al registrarse en [address] de Emergencias y a la enfermera de triaje.  Si tiene preguntas despus de su visita o necesita cancelar o volver a programar su cita, por favor pngase en contacto con [facility] - A DEPT OF Eligha Bridegroom [facility]  [phone]  y siga las instrucciones. Las horas de oficina son de 8:00 a.m. a 4:30 p.m. de lunes a viernes. Por favor, tenga en cuenta que los mensajes de voz que se dejan despus de las 4:00 p.m. posiblemente no se devolvern hasta el siguiente da de [place].  Cerramos los fines de semana y [employer]. En todo momento tiene acceso a una enfermera para preguntas urgentes. Por favor, llame al nmero principal de la clnica  [phone] y siga las instrucciones.   Para cualquier pregunta que no sea de carcter urgente, tambin puede ponerse en contacto con su proveedor [employer]. Ahora ofrecemos visitas electrnicas para cualquier persona mayor de 18 aos que solicite atencin mdica en lnea para los sntomas que no sean urgentes. Para ms detalles vaya a [email or web address].   Tambin puede bajar la aplicacin de MyChart! Vaya a la tienda de aplicaciones, busque "MyChart", abra la aplicacin, seleccione [place], e ingrese con su nombre de usuario y la contrasea de Clinical cytogeneticist.

## 2024-02-20 ENCOUNTER — Encounter: Payer: Self-pay | Admitting: Oncology

## 2024-02-20 ENCOUNTER — Telehealth: Payer: Self-pay

## 2024-02-20 ENCOUNTER — Inpatient Hospital Stay: Payer: 59

## 2024-02-20 ENCOUNTER — Other Ambulatory Visit: Payer: Self-pay

## 2024-02-20 VITALS — BP 150/81 | HR 88 | Temp 97.3°F | Resp 20

## 2024-02-20 DIAGNOSIS — Z5112 Encounter for antineoplastic immunotherapy: Secondary | ICD-10-CM | POA: Diagnosis not present

## 2024-02-20 DIAGNOSIS — C8333 Diffuse large B-cell lymphoma, intra-abdominal lymph nodes: Secondary | ICD-10-CM

## 2024-02-20 MED ORDER — VINCRISTINE SULFATE CHEMO INJECTION 1 MG/ML
Freq: Once | INTRAVENOUS | Status: AC
  Filled 2024-02-20: qty 11

## 2024-02-20 NOTE — Patient Instructions (Signed)
 Instrucciones al darle de alta: Discharge Instructions Gracias por elegir al [facility] de Cncer de [place] para brindarle atencin mdica de oncologa y Teacher, English as a foreign language.   Si usted tiene una cita de laboratorio con [employer] de [place], por favor vaya directamente al [employer] de Cncer y regstrese en el rea de Engineer, maintenance (IT).   Use ropa cmoda y [place] para tener fcil acceso a las vas del Portacath (acceso venoso de Set designer duracin) o la lnea PICC (catter central colocado por va perifrica).   Nos esforzamos por ofrecerle tiempo de calidad con su proveedor. Es posible que tenga que volver a programar su cita si llega tarde (15 minutos o ms).  El llegar tarde le afecta a usted y a otros pacientes cuyas citas son posteriores a Armed forces operational officer.  Adems, si usted falta a tres o ms citas sin avisar a la oficina, puede ser retirado(a) de la clnica a discrecin del proveedor.      Para las solicitudes de renovacin de recetas, pida a su farmacia que se ponga en contacto con nuestra oficina y deje que transcurran 72 horas para que se complete el proceso de las renovaciones.    Hoy usted recibi los siguientes agentes de quimioterapia e/o inmunoterapia etoposide, doxorubicin, vincristine      Para ayudar a prevenir las nuseas y los vmitos despus de su tratamiento, le recomendamos que tome su medicamento para las nuseas segn las indicaciones.  LOS SNTOMAS QUE DEBEN COMUNICARSE INMEDIATAMENTE SE INDICAN A CONTINUACIN: *FIEBRE SUPERIOR A 100.4 F (38 C) O MS *ESCALOFROS O SUDORACIN *NUSEAS Y VMITOS QUE NO SE CONTROLAN CON EL MEDICAMENTO PARA LAS NUSEAS *DIFICULTAD INUSUAL PARA RESPIRAR  *MORETONES O HEMORRAGIAS NO HABITUALES *PROBLEMAS URINARIOS (dolor o ardor al Geographical information systems officer o frecuencia para Geographical information systems officer) *PROBLEMAS INTESTINALES (diarrea inusual, estreimiento, dolor cerca del ano) SENSIBILIDAD EN LA BOCA Y EN LA GARGANTA CON O SIN LA PRESENCIA DE LCERAS (dolor de garganta, llagas en la boca o dolor de  muelas/dientes) ERUPCIN, HINCHAZN O DOLORES INUSUALES FLUJO VAGINAL INUSUAL O PICAZN/RASQUIA    Los puntos marcados con un asterisco ( *) indican una posible emergencia y debe hacer un seguimiento tan pronto como le sea posible o vaya al Departamento de Emergencias si se le presenta algn problema.  Por favor, muestre la [place] DE ADVERTENCIA DE Marc Morgans DE ADVERTENCIA DE [place] Fanti al registrarse en [address] de Emergencias y a la enfermera de triaje.  Si tiene preguntas despus de su visita o necesita cancelar o volver a programar su cita, por favor pngase en contacto con [facility] - A DEPT OF Eligha Bridegroom [facility]  [phone]  y siga las instrucciones. Las horas de oficina son de 8:00 a.m. a 4:30 p.m. de lunes a viernes. Por favor, tenga en cuenta que los mensajes de voz que se dejan despus de las 4:00 p.m. posiblemente no se devolvern hasta el siguiente da de [place].  Cerramos los fines de semana y [employer]. En todo momento tiene acceso a una enfermera para preguntas urgentes. Por favor, llame al nmero principal de la clnica  [phone] y siga las instrucciones.   Para cualquier pregunta que no sea de carcter urgente, tambin puede ponerse en contacto con su proveedor [employer]. Ahora ofrecemos visitas electrnicas para cualquier persona mayor de 18 aos que solicite atencin mdica en lnea para los sntomas que no sean urgentes. Para ms detalles vaya a [email or web address].   Tambin puede bajar la aplicacin de MyChart! William Mathews  a la tienda de aplicaciones, busque "MyChart", abra la aplicacin, seleccione [place], e ingrese con su nombre de usuario y la contrasea de Clinical cytogeneticist.

## 2024-02-20 NOTE — Telephone Encounter (Signed)
 Spoke to [place] at [place] regarding prior. Auth pending now for the regimen+Methotrexate / request p2p. Referred to case [id number] id# [id number]; P2P with [facility] Medical Director scheduled for Thursday 02/22/24 at 10AM.  Representative indicated if applicable to submit any additional information / documentation email [email or web address]" or fax to [phone].  To cancel / reschedule please call [phone].

## 2024-02-20 NOTE — Telephone Encounter (Signed)
 They will call me on my cell phone correct?

## 2024-02-21 ENCOUNTER — Inpatient Hospital Stay

## 2024-02-21 ENCOUNTER — Encounter: Payer: Self-pay | Admitting: Oncology

## 2024-02-21 ENCOUNTER — Inpatient Hospital Stay: Payer: 59

## 2024-02-21 VITALS — BP 128/85 | HR 93 | Temp 97.9°F | Resp 18

## 2024-02-21 DIAGNOSIS — Z5112 Encounter for antineoplastic immunotherapy: Secondary | ICD-10-CM | POA: Diagnosis not present

## 2024-02-21 DIAGNOSIS — C8333 Diffuse large B-cell lymphoma, intra-abdominal lymph nodes: Secondary | ICD-10-CM

## 2024-02-21 MED ORDER — VINCRISTINE SULFATE CHEMO INJECTION 1 MG/ML
Freq: Once | INTRAVENOUS | Status: AC
  Filled 2024-02-21: qty 11

## 2024-02-21 MED ORDER — PALONOSETRON HCL INJECTION 0.25 MG/5ML
0.2500 mg | Freq: Once | INTRAVENOUS | Status: AC
  Administered 2024-02-21: 0.25 mg via INTRAVENOUS
  Filled 2024-02-21: qty 5

## 2024-02-22 ENCOUNTER — Telehealth: Payer: Self-pay | Admitting: *Deleted

## 2024-02-22 ENCOUNTER — Inpatient Hospital Stay: Payer: 59

## 2024-02-22 ENCOUNTER — Inpatient Hospital Stay

## 2024-02-22 VITALS — BP 114/70 | HR 84 | Temp 99.2°F | Resp 17

## 2024-02-22 DIAGNOSIS — Z5112 Encounter for antineoplastic immunotherapy: Secondary | ICD-10-CM | POA: Diagnosis not present

## 2024-02-22 DIAGNOSIS — C8333 Diffuse large B-cell lymphoma, intra-abdominal lymph nodes: Secondary | ICD-10-CM

## 2024-02-22 MED ORDER — VINCRISTINE SULFATE CHEMO INJECTION 1 MG/ML
Freq: Once | INTRAVENOUS | Status: AC
  Filled 2024-02-22: qty 11

## 2024-02-22 NOTE — Telephone Encounter (Signed)
 Per DR. [id number]- approval number for intrathecal methotrexate. DA REPOCH plus MTX

## 2024-02-23 ENCOUNTER — Inpatient Hospital Stay

## 2024-02-23 ENCOUNTER — Inpatient Hospital Stay: Payer: 59

## 2024-02-23 ENCOUNTER — Encounter: Payer: Self-pay | Admitting: Oncology

## 2024-02-23 VITALS — BP 112/72 | HR 70 | Temp 98.2°F | Resp 18

## 2024-02-23 DIAGNOSIS — Z5112 Encounter for antineoplastic immunotherapy: Secondary | ICD-10-CM | POA: Diagnosis not present

## 2024-02-23 DIAGNOSIS — C8333 Diffuse large B-cell lymphoma, intra-abdominal lymph nodes: Secondary | ICD-10-CM

## 2024-02-23 MED ORDER — HEPARIN SOD (PORK) LOCK FLUSH 100 UNIT/ML IV SOLN
500.0000 [IU] | Freq: Once | INTRAVENOUS | Status: AC | PRN
Start: 2024-02-23 — End: 2024-02-23
  Administered 2024-02-23: 500 [IU]
  Filled 2024-02-23: qty 5

## 2024-02-23 MED ORDER — PALONOSETRON HCL INJECTION 0.25 MG/5ML
0.2500 mg | Freq: Once | INTRAVENOUS | Status: AC
  Administered 2024-02-23: 0.25 mg via INTRAVENOUS
  Filled 2024-02-23: qty 5

## 2024-02-23 MED ORDER — SODIUM CHLORIDE 0.9% FLUSH
10.0000 mL | INTRAVENOUS | Status: DC | PRN
  Administered 2024-02-23: 10 mL
  Filled 2024-02-23: qty 10

## 2024-02-23 MED ORDER — SODIUM CHLORIDE 0.9 % IV SOLN
900.0000 mg/m2 | Freq: Once | INTRAVENOUS | Status: AC
  Administered 2024-02-23: 1500 mg via INTRAVENOUS
  Filled 2024-02-23: qty 75

## 2024-02-23 MED ORDER — SODIUM CHLORIDE 0.9 % IV SOLN
INTRAVENOUS | Status: DC
  Filled 2024-02-23: qty 250

## 2024-02-26 ENCOUNTER — Encounter: Payer: Self-pay | Admitting: Oncology

## 2024-02-26 ENCOUNTER — Inpatient Hospital Stay

## 2024-02-26 ENCOUNTER — Ambulatory Visit: Payer: 59

## 2024-02-26 DIAGNOSIS — Z5112 Encounter for antineoplastic immunotherapy: Secondary | ICD-10-CM | POA: Diagnosis not present

## 2024-02-26 DIAGNOSIS — C8333 Diffuse large B-cell lymphoma, intra-abdominal lymph nodes: Secondary | ICD-10-CM

## 2024-02-26 LAB — CBC WITH DIFFERENTIAL (CANCER CENTER ONLY)
Abs Immature Granulocytes: 0.06 10*3/uL (ref 0.00–0.07)
Basophils Absolute: 0 10*3/uL (ref 0.0–0.1)
Basophils Relative: 0 %
Eosinophils Absolute: 0.1 10*3/uL (ref 0.0–0.5)
Eosinophils Relative: 1 %
HCT: 35.1 % — ABNORMAL LOW (ref 39.0–52.0)
Hemoglobin: 11.8 g/dL — ABNORMAL LOW (ref 13.0–17.0)
Immature Granulocytes: 1 %
Lymphocytes Relative: 9 %
Lymphs Abs: 0.9 10*3/uL (ref 0.7–4.0)
MCH: 29.2 pg (ref 26.0–34.0)
MCHC: 33.6 g/dL (ref 30.0–36.0)
MCV: 86.9 fL (ref 80.0–100.0)
Monocytes Absolute: 0.1 10*3/uL (ref 0.1–1.0)
Monocytes Relative: 1 %
Neutro Abs: 8.5 10*3/uL — ABNORMAL HIGH (ref 1.7–7.7)
Neutrophils Relative %: 88 %
Platelet Count: 248 10*3/uL (ref 150–400)
RBC: 4.04 MIL/uL — ABNORMAL LOW (ref 4.22–5.81)
RDW: 16 % — ABNORMAL HIGH (ref 11.5–15.5)
WBC Count: 9.5 10*3/uL (ref 4.0–10.5)
nRBC: 0 % (ref 0.0–0.2)

## 2024-02-26 LAB — CMP (CANCER CENTER ONLY)
ALT: 23 U/L (ref 0–44)
AST: 19 U/L (ref 15–41)
Albumin: 3.7 g/dL (ref 3.5–5.0)
Alkaline Phosphatase: 56 U/L (ref 38–126)
Anion gap: 7 (ref 5–15)
BUN: 18 mg/dL (ref 8–23)
CO2: 26 mmol/L (ref 22–32)
Calcium: 8.6 mg/dL — ABNORMAL LOW (ref 8.9–10.3)
Chloride: 102 mmol/L (ref 98–111)
Creatinine: 0.78 mg/dL (ref 0.61–1.24)
GFR, Estimated: >60 mL/min (ref >60)
Glucose, Bld: 117 mg/dL — ABNORMAL HIGH (ref 70–99)
Potassium: 3.5 mmol/L (ref 3.5–5.1)
Sodium: 135 mmol/L (ref 135–145)
Total Bilirubin: 0.6 mg/dL (ref 0.0–1.2)
Total Protein: 6 g/dL — ABNORMAL LOW (ref 6.5–8.1)

## 2024-02-26 MED ORDER — PEGFILGRASTIM-CBQV 6 MG/0.6ML ~~LOC~~ SOSY
6.0000 mg | PREFILLED_SYRINGE | Freq: Once | SUBCUTANEOUS | Status: AC
  Administered 2024-02-26: 6 mg via SUBCUTANEOUS
  Filled 2024-02-26: qty 0.6

## 2024-02-26 MED ORDER — SODIUM CHLORIDE (PF) 0.9 % IJ SOLN
Freq: Once | INTRAMUSCULAR | Status: AC
  Filled 2024-02-26: qty 0.48

## 2024-02-26 NOTE — Progress Notes (Signed)
 Patient for DG Methotrexate Inj - Chemo on Tues 02/27/24, I called and spoke with the patient's daughter, Annabell on the phone and gave pre-procedure instructions. Annabell was made aware to have the patient here at 9:30a and check in at the new entrance. Annabell stated understanding.  Called 02/26/24

## 2024-02-27 ENCOUNTER — Ambulatory Visit
Admission: RE | Admit: 2024-02-27 | Discharge: 2024-02-27 | Disposition: A | Discharge status: Home / Self Care | Source: Ambulatory Visit | Attending: Oncology | Admitting: Oncology

## 2024-02-27 VITALS — BP 121/79 | HR 97 | Temp 98.7°F | Resp 14

## 2024-02-27 DIAGNOSIS — C8333 Diffuse large B-cell lymphoma, intra-abdominal lymph nodes: Secondary | ICD-10-CM | POA: Diagnosis present

## 2024-02-27 MED ORDER — LIDOCAINE 1 % OPTIME INJ - NO CHARGE
5.0000 mL | Freq: Once | INTRAMUSCULAR | Status: AC
  Administered 2024-02-27: 5 mL via SUBCUTANEOUS
  Filled 2024-02-27: qty 6

## 2024-02-27 NOTE — Discharge Instructions (Signed)
Lumbar Puncture, Care After Refer to this sheet in the next few weeks. These instructions provide you with information on caring for yourself after your procedure. Your health care provider may also give you more specific instructions. Your treatment has been planned according to current medical practices, but problems sometimes occur. Call your health care provider if you have any problems or questions after your procedure. What can I expect after the procedure? After your procedure, it is typical to have the following sensations: Mild discomfort or pain at the insertion site. Mild headache that is relieved with pain medicines.  Follow these instructions at home:  Avoid lifting anything heavier than 10 lb (4.5 kg) for at least 12 hours after the procedure. Drink enough fluids to keep your urine clear or pale yellow. Lay flat or as flat as possible for the remainder of the day. Contact a health care provider if: You have fever or chills. You have nausea or vomiting. You have a headache that lasts for more than 2 days. Get help right away if: You have any numbness or tingling in your legs. You are unable to control your bowel or bladder. You have bleeding or swelling in your back at the insertion site. You are dizzy or faint. This information is not intended to replace advice given to you by your health care provider. Make sure you discuss any questions you have with your health care provider. Document Released: 11/19/2013 Document Revised: 04/21/2016 Document Reviewed: 07/23/2013 Elsevier Interactive Patient Education  2017 Elsevier Inc. 

## 2024-02-28 ENCOUNTER — Ambulatory Visit
Admission: RE | Admit: 2024-02-28 | Discharge: 2024-02-28 | Disposition: A | Discharge status: Home / Self Care | Source: Ambulatory Visit | Attending: Oncology | Admitting: Oncology

## 2024-02-28 ENCOUNTER — Ambulatory Visit

## 2024-02-28 DIAGNOSIS — C8333 Diffuse large B-cell lymphoma, intra-abdominal lymph nodes: Secondary | ICD-10-CM | POA: Diagnosis present

## 2024-02-28 LAB — GLUCOSE, CAPILLARY: Glucose-Capillary: 97 mg/dL (ref 70–99)

## 2024-02-28 MED ORDER — FLUDEOXYGLUCOSE F - 18 (FDG) INJECTION
9.3700 | Freq: Once | INTRAVENOUS | Status: AC | PRN
  Administered 2024-02-28: 9.37 via INTRAVENOUS

## 2024-03-01 ENCOUNTER — Inpatient Hospital Stay (HOSPITAL_BASED_OUTPATIENT_CLINIC_OR_DEPARTMENT_OTHER): Admitting: Oncology

## 2024-03-01 ENCOUNTER — Inpatient Hospital Stay: Attending: Oncology | Admitting: Oncology

## 2024-03-01 VITALS — BP 108/75 | HR 98 | Temp 97.3°F | Resp 16 | Wt 168.0 lb

## 2024-03-01 DIAGNOSIS — R519 Headache, unspecified: Secondary | ICD-10-CM | POA: Insufficient documentation

## 2024-03-01 DIAGNOSIS — W44F3XA Food entering into or through a natural orifice, initial encounter: Secondary | ICD-10-CM | POA: Diagnosis not present

## 2024-03-01 DIAGNOSIS — T451X5A Adverse effect of antineoplastic and immunosuppressive drugs, initial encounter: Secondary | ICD-10-CM | POA: Insufficient documentation

## 2024-03-01 DIAGNOSIS — R109 Unspecified abdominal pain: Secondary | ICD-10-CM | POA: Diagnosis not present

## 2024-03-01 DIAGNOSIS — Z800 Family history of malignant neoplasm of digestive organs: Secondary | ICD-10-CM | POA: Diagnosis not present

## 2024-03-01 DIAGNOSIS — Z79634 Long term (current) use of topoisomerase inhibitor: Secondary | ICD-10-CM | POA: Diagnosis not present

## 2024-03-01 DIAGNOSIS — Z5112 Encounter for antineoplastic immunotherapy: Secondary | ICD-10-CM | POA: Diagnosis present

## 2024-03-01 DIAGNOSIS — R1319 Other dysphagia: Secondary | ICD-10-CM | POA: Diagnosis not present

## 2024-03-01 DIAGNOSIS — C8333 Diffuse large B-cell lymphoma, intra-abdominal lymph nodes: Secondary | ICD-10-CM

## 2024-03-01 DIAGNOSIS — Z5189 Encounter for other specified aftercare: Secondary | ICD-10-CM | POA: Diagnosis not present

## 2024-03-01 DIAGNOSIS — Z79624 Long term (current) use of inhibitors of nucleotide synthesis: Secondary | ICD-10-CM | POA: Diagnosis not present

## 2024-03-01 DIAGNOSIS — Z79633 Long term (current) use of mitotic inhibitor: Secondary | ICD-10-CM | POA: Diagnosis not present

## 2024-03-01 DIAGNOSIS — D701 Agranulocytosis secondary to cancer chemotherapy: Secondary | ICD-10-CM | POA: Diagnosis not present

## 2024-03-01 DIAGNOSIS — Z87891 Personal history of nicotine dependence: Secondary | ICD-10-CM | POA: Diagnosis not present

## 2024-03-01 DIAGNOSIS — Z809 Family history of malignant neoplasm, unspecified: Secondary | ICD-10-CM | POA: Diagnosis not present

## 2024-03-01 DIAGNOSIS — R6881 Early satiety: Secondary | ICD-10-CM | POA: Diagnosis not present

## 2024-03-01 DIAGNOSIS — T18128A Food in esophagus causing other injury, initial encounter: Secondary | ICD-10-CM | POA: Insufficient documentation

## 2024-03-01 DIAGNOSIS — Z79632 Long term (current) use of antitumor antibiotic: Secondary | ICD-10-CM | POA: Diagnosis not present

## 2024-03-01 DIAGNOSIS — R7989 Other specified abnormal findings of blood chemistry: Secondary | ICD-10-CM | POA: Diagnosis not present

## 2024-03-01 DIAGNOSIS — Z7952 Long term (current) use of systemic steroids: Secondary | ICD-10-CM | POA: Diagnosis not present

## 2024-03-01 DIAGNOSIS — D72829 Elevated white blood cell count, unspecified: Secondary | ICD-10-CM | POA: Diagnosis not present

## 2024-03-01 DIAGNOSIS — Z79899 Other long term (current) drug therapy: Secondary | ICD-10-CM | POA: Diagnosis not present

## 2024-03-01 DIAGNOSIS — Z5111 Encounter for antineoplastic chemotherapy: Secondary | ICD-10-CM | POA: Insufficient documentation

## 2024-03-01 DIAGNOSIS — R140 Abdominal distension (gaseous): Secondary | ICD-10-CM | POA: Insufficient documentation

## 2024-03-01 DIAGNOSIS — Z79631 Long term (current) use of antimetabolite agent: Secondary | ICD-10-CM | POA: Diagnosis not present

## 2024-03-01 LAB — CMP (CANCER CENTER ONLY)
ALT: 28 U/L (ref 0–44)
AST: 17 U/L (ref 15–41)
Albumin: 3.7 g/dL (ref 3.5–5.0)
Alkaline Phosphatase: 71 U/L (ref 38–126)
Anion gap: 8 (ref 5–15)
BUN: 14 mg/dL (ref 8–23)
CO2: 22 mmol/L (ref 22–32)
Calcium: 8.1 mg/dL — ABNORMAL LOW (ref 8.9–10.3)
Chloride: 104 mmol/L (ref 98–111)
Creatinine: 0.93 mg/dL (ref 0.61–1.24)
GFR, Estimated: >60 mL/min (ref >60)
Glucose, Bld: 132 mg/dL — ABNORMAL HIGH (ref 70–99)
Potassium: 3.8 mmol/L (ref 3.5–5.1)
Sodium: 134 mmol/L — ABNORMAL LOW (ref 135–145)
Total Bilirubin: 0.5 mg/dL (ref 0.0–1.2)
Total Protein: 6.2 g/dL — ABNORMAL LOW (ref 6.5–8.1)

## 2024-03-01 LAB — CBC WITH DIFFERENTIAL (CANCER CENTER ONLY)
Abs Immature Granulocytes: 0.17 10*3/uL — ABNORMAL HIGH (ref 0.00–0.07)
Basophils Absolute: 0 10*3/uL (ref 0.0–0.1)
Basophils Relative: 2 %
Eosinophils Absolute: 0 10*3/uL (ref 0.0–0.5)
Eosinophils Relative: 2 %
HCT: 31.1 % — ABNORMAL LOW (ref 39.0–52.0)
Hemoglobin: 10.5 g/dL — ABNORMAL LOW (ref 13.0–17.0)
Immature Granulocytes: 10 %
Lymphocytes Relative: 37 %
Lymphs Abs: 0.6 10*3/uL — ABNORMAL LOW (ref 0.7–4.0)
MCH: 29.2 pg (ref 26.0–34.0)
MCHC: 33.8 g/dL (ref 30.0–36.0)
MCV: 86.6 fL (ref 80.0–100.0)
Monocytes Absolute: 0.1 10*3/uL (ref 0.1–1.0)
Monocytes Relative: 7 %
Neutro Abs: 0.7 10*3/uL — ABNORMAL LOW (ref 1.7–7.7)
Neutrophils Relative %: 42 %
Platelet Count: 122 10*3/uL — ABNORMAL LOW (ref 150–400)
RBC: 3.59 MIL/uL — ABNORMAL LOW (ref 4.22–5.81)
RDW: 15.9 % — ABNORMAL HIGH (ref 11.5–15.5)
Smear Review: DECREASED
WBC Count: 1.7 10*3/uL — ABNORMAL LOW (ref 4.0–10.5)
nRBC: 0 % (ref 0.0–0.2)

## 2024-03-01 NOTE — Progress Notes (Signed)
 Patient reports headache since receiving tx inj on 02/27/24.  The headache improves with lying down.  The headache is 6-7 pain scale.

## 2024-03-02 ENCOUNTER — Encounter: Payer: Self-pay | Admitting: Oncology

## 2024-03-02 NOTE — Progress Notes (Signed)
 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  He had a CT abdomen and pelvis with contrast which showed large soft tissue mass along the mesentery measuring up to 21 cm with encasement of central mesenteric vessels and loss of tissue planes between the mass and several bowel loops.  Caliber change along the third portion of duodenum with partial mild obstruction.  Findings concerning for lymphoma.  Serum creatinine mildly elevated at 1.4.  Spleen slightly enlarged without intrinsic splenic mass.  Patient denies any significant nausea or vomiting.  He has early satiety   PET CT scan from 12/22/2023 showed Hypermetabolic left supraclavicular mediastinal retroperitoneal nodes along with large central mesenteric nodal mass consistent with lymphoma.  SUV in these areas ranging around SUV 16.  Central mesenteric nodal mass extending into the upper pelvis.  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  Cells positive for CD20 with coexpression of CD10 and BCL6 consistent with germinal center/folic killer phenotype and aberrantly express Bcl-2 strongly.  MOM1 and CD30 are negative.  EBV ISH negative.   Proliferation ranges from 20 to 80% with overall average of 60 to 70%.  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       Patient received Garlon Hatchet [facility] chemotherapy for cycle 1.  Double hit testing showed make gene rearrangement at 75%. Although Bcl-2 and BCL6 rearrangement was detected on FISH these were not known partners to make and therefore it was reported as a pseudo double hit.  However given the fact that this is a transformed diffuse large B-cell lymphoma from a pre-existing follicular lymphoma this would fit the pattern of a double hit lymphoma/ high grade B cell lymphoma.  Plan was therefore made to switch him to dose adjusted R-EPOCH chemotherapy for the remainder of the cycles   Initially plan was not to offer CNS prophylaxis however given that his double hit lymphoma with an intermediate risk score of 3 intrathecal methotrexate will be given if tolerated for 4 cycles  Interval history-patient reports having headache after receiving intrathecal methotrexate.  Headache resolves when he lies down but comes back when he sits up.  He has not used any medications for his headache so far.  He was given 3 days of prednisone following intrathecal methotrexate as well but did not feel that it helped.  He has not had any significant side effects from chemotherapy so far  ECOG PS- 0 Pain scale- 0   Review of systems- Review of Systems  Constitutional:  Negative for chills, fever, malaise/fatigue and weight loss.  HENT:  Negative for  congestion, ear discharge and nosebleeds.   Eyes:  Negative for blurred vision.  Respiratory:  Negative for cough, hemoptysis, sputum production, shortness of breath and wheezing.   Cardiovascular:  Negative for chest pain, palpitations, orthopnea and claudication.   Gastrointestinal:  Negative for abdominal pain, blood in stool, constipation, diarrhea, heartburn, melena, nausea and vomiting.  Genitourinary:  Negative for dysuria, flank pain, frequency, hematuria and urgency.  Musculoskeletal:  Negative for back pain, joint pain and myalgias.  Skin:  Negative for rash.  Neurological:  Positive for headaches. Negative for dizziness, tingling, focal weakness, seizures and weakness.  Endo/Heme/Allergies:  Does not bruise/bleed easily.  Psychiatric/Behavioral:  Negative for depression and suicidal ideas. The patient does not have insomnia.       No Known Allergies   No past medical history on file.   Past Surgical History:  Procedure Laterality Date   FISTULOTOMY N/A 01/10/2018   Procedure: FISTULOTOMY;  Surgeon: Carolan Shiver, MD;  Location: [facility] ORS;  Service: General;  Laterality: N/A;   MASS BIOPSY Left 12/28/2023   Procedure: NECK MASS BIOPSY;  Surgeon: Leafy Ro, MD;  Location: [facility] ORS;  Service: General;  Laterality: Left;   PORTACATH PLACEMENT N/A 12/28/2023   Procedure: INSERTION PORT-A-CATH;  Surgeon: Leafy Ro, MD;  Location: [facility] ORS;  Service: General;  Laterality: N/A;    Social History   Socioeconomic History   Marital status: Single    Spouse name: Not on file   Number of children: 4   Years of education: Not on file   Highest education level: Not on file  Occupational History   Not on file  Tobacco Use   Smoking status: Former    Types: Cigarettes    Passive exposure: Past   Smokeless tobacco: Never  Vaping Use   Vaping status: Never Used  Substance and Sexual Activity   Alcohol use: Not Currently   Drug use: Not Currently    Types: Cocaine    Comment: years ago    Sexual activity: Yes  Other Topics Concern   Not on file  Social History Narrative   Not on file   Social Drivers of Health   Financial Resource Strain: Not on file  Food Insecurity: No Food Insecurity (12/27/2023)   Hunger  Vital Sign    Worried About Running Out of Food in the Last Year: Never true    Ran Out of Food in the Last Year: Never true  Transportation Needs: No Transportation Needs (12/27/2023)   PRAPARE - Administrator, Civil Service (Medical): No    Lack of Transportation (Non-Medical): No  Physical Activity: Inactive (12/27/2023)   Exercise Vital Sign    Days of Exercise per Week: 0 days    Minutes of Exercise per Session: 0 min  Stress: Not on file  Social Connections: Not on file  Intimate Partner Violence: Not At Risk (12/27/2023)   Humiliation, Afraid, Rape, and Kick questionnaire    Fear of Current or Ex-Partner: No    Emotionally Abused: No    Physically Abused: No    Sexually Abused: No    Family History  Problem Relation Age of Onset   Colon cancer Mother    Pancreatic cancer Father    Cancer Sister      Current Outpatient Medications:    acyclovir (ZOVIRAX) 400 MG tablet, Take 400 mg by mouth daily., Disp: , Rfl:    sulfamethoxazole-trimethoprim (BACTRIM DS) 800-160 MG tablet, Take 1 tablet by mouth 2 (two)  times daily., Disp: , Rfl:    HYDROcodone-acetaminophen (NORCO/VICODIN) 5-325 MG tablet, Take 1 tablet by mouth every 6 (six) hours as needed for moderate pain (pain score 4-6). (Patient not taking: Reported on 03/01/2024), Disp: 15 tablet, Rfl: 0   predniSONE (DELTASONE) 20 MG tablet, Take 5 tablets (100 mg total) by mouth daily with breakfast. (Patient not taking: Reported on 03/01/2024), Disp: 35 tablet, Rfl: 2   predniSONE (DELTASONE) 20 MG tablet, Take 1 tablet (20 mg total) by mouth daily with breakfast. (Patient not taking: Reported on 03/01/2024), Disp: 2 tablet, Rfl: 0 No current facility-administered medications for this visit.  Facility-Administered Medications Ordered in Other Visits:    0.9 %  sodium chloride infusion, , Intravenous, Continuous, Creig Hines, MD, Stopped at 01/29/24 1143   sodium chloride flush (NS) 0.9 % injection 10 mL, 10 mL,  Intracatheter, PRN, Creig Hines, MD, 10 mL at 01/30/24 1254  Physical exam:  Vitals:   03/01/24 1500  BP: 108/75  Pulse: 98  Resp: 16  Temp: (!) 97.3 F (36.3 C)  TempSrc: Tympanic  Weight: 168 lb (76.2 kg)   Physical Exam Cardiovascular:     Rate and Rhythm: Normal rate and regular rhythm.     Heart sounds: Normal heart sounds.  Pulmonary:     Effort: Pulmonary effort is normal.     Breath sounds: Normal breath sounds.  Abdominal:     General: Bowel sounds are normal.     Palpations: Abdomen is soft.  Skin:    General: Skin is warm and dry.  Neurological:     Mental Status: He is alert and oriented to person, place, and time.      I have personally reviewed labs listed below:    Latest Ref Rng & Units 03/01/2024    2:57 PM  CMP  Glucose 70 - 99 mg/dL 161   BUN 8 - 23 mg/dL 14   Creatinine 0.96 - 1.24 mg/dL 0.45   Sodium 409 - 811 mmol/L 134   Potassium 3.5 - 5.1 mmol/L 3.8   Chloride 98 - 111 mmol/L 104   CO2 22 - 32 mmol/L 22   Calcium 8.9 - 10.3 mg/dL 8.1   Total Protein 6.5 - 8.1 g/dL 6.2   Total Bilirubin 0.0 - 1.2 mg/dL 0.5   Alkaline Phos 38 - 126 U/L 71   AST 15 - 41 U/L 17   ALT 0 - 44 U/L 28       Latest Ref Rng & Units 03/01/2024    2:57 PM  CBC  WBC 4.0 - 10.5 K/uL 1.7   Hemoglobin 13.0 - 17.0 g/dL 91.4   Hematocrit 78.2 - 52.0 % 31.1   Platelets 150 - 400 K/uL 122    I have personally reviewed Radiology images listed below: No images are attached to the encounter.  NM PET Image Restag (PS) Skull Base To Thigh Result Date: 03/02/2024 CLINICAL DATA:  Subsequent treatment strategy for large B-cell lymphoma. EXAM: NUCLEAR MEDICINE PET SKULL BASE TO THIGH TECHNIQUE: 9.37 mCi F-18 FDG was injected intravenously. Full-ring PET imaging was performed from the skull base to thigh after the radiotracer. CT data was obtained and used for attenuation correction and anatomic localization. Fasting blood glucose: 97 mg/dl COMPARISON:  Prior PET-CT 12/22/2023  FINDINGS: Mediastinal blood pool activity: SUV max 1.96 Liver activity: SUV max 3.27 NECK: Minimal residual matted soft tissue density in the left supraclavicular area along with small surgical clips suggesting prior surgical excisional biopsy. No  residual measurable disease or hypermetabolism (Deauville 1). Incidental CT findings: None. CHEST: No hypermetabolic mediastinal or hilar nodes. No suspicious pulmonary nodules on the CT scan. Incidental CT findings: The left-sided Port-A-Cath is in good position. ABDOMEN/PELVIS: The extensive/bulky mesenteric and retroperitoneal lymphadenopathy seen on the prior PET-CT has largely resolved. There is a matted masslike area of residual soft tissue density in the root of the small bowel mesentery measuring approximately 9.4 x 4.0 cm on image 103/6. Residual low level FDG uptake with SUV max of 2.55 and previously 13.3 (Deauville 3). Ill-defined matted soft tissue density in the retro per also consistent with treated tumor. SUV max is 2.54 and was previously 17.84 (Deauville 3). No residual measurable pelvic adenopathy or hypermetabolism (Deauville 1). No splenomegaly or splenic hypermetabolic lesions. Incidental CT findings: Stable scattered vascular calcifications. Abdominal/pelvic ascites seen on the prior PET-CT has resolved. SKELETON: Mild diffuse marrow hypermetabolism likely treatment related. Incidental CT findings: None. IMPRESSION: 1. PET-CT findings suggest a good response to treatment as detailed above. 2. No residual measurable or hypermetabolic left supraclavicular or pelvic adenopathy (Deauville 1). 3. Matted/treated soft tissue density in the mesentery and retroperitoneum with very low level FDG uptake consistent with treated disease (Deauville 3). 4. No splenomegaly or splenic hypermetabolic lesions. 5. Mild diffuse marrow hypermetabolism likely treatment related. Electronically Signed   By: Rudie Meyer M.D.   On: 03/02/2024 11:07   DG FL LUMBAR PUNCTURE  W/CHEMO INJECT Result Date: 02/27/2024 CLINICAL DATA:  Patient with diffuse large B-cell lymphoma who is here today for intrathecal injection of methotrexate and hydrocortisone per his oncology provider. EXAM: FLUOROSCOPICALLY GUIDED LUMBAR PUNCTURE FOR INTRATHECAL CHEMOTHERAPY COMPARISON:  None. FLUOROSCOPY TIME:  Radiation Exposure Index (if provided by the fluoroscopic device): 18 seconds, 1.9 mGy PROCEDURE: Informed consent was obtained from the patient prior to the procedure, including potential complications or reactions to the medication, seizures, bleeding, infection, paresthesias, nerve damage, CSF leak requiring additional procedures, post procedure requirement to lay flat for several hours after the procedure, headache, allergy, and pain. With the patient prone, the lower back was prepped with Betadine. 1% lidocaine was used for local anesthesia. Lumbar puncture was performed at the L4-L5 level using a 20 gauge needle with return of colorless CSF with an opening pressure of 14 cm water. A pre-filled 10 mL sterile syringe from the pharmacy containing 12 mg of methotrexate with 50 mg of hydrocortisone sodium succinate was injected into the subarachnoid space. The inner stylet was placed back into the needle and the needle was removed. The patient tolerated the procedure well and there were no apparent complications. A sterile bandage was applied. IMPRESSION: Successful fluoroscopic guided lumbar puncture and intrathecal injection of chemotherapy, without complication. This exam was performed by Pattricia Boss PA-C, and was supervised and interpreted by Dr. Alto Denver. Electronically Signed   By: Emily Filbert M.D.   On: 02/27/2024 12:28     Assessment and plan- Patient is a 64 y.o. 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  This post 1 cycle of Pola R CHP chemotherapy and 2 cycles of dose adjusted R-EPOCH chemotherapy here for toxicity assessment  Patient received 1  dose of intrathecal methotrexate for CNS prophylaxis and he did have headache following that.  He continues to have headache and I have asked him to use as needed Tylenol.  Hopefully symptoms should resolve in the next few days and we could consider adding oxycodone if headaches persist.  He is otherwise  tolerated dosages EPOCH chemotherapy well despite the 20% dose increase.  ANC 0.7 today which would be expected a week after chemotherapy.  We will continue to monitor CBC with differential twice a week next week and if ANC remains stable more than 500 on 2 separate occasions we will plan to proceed with cycle 4 of treatment with the same dose as cycle 3.  PET scan report is not officially back today but I did review the PET scan images independently which shows significant response to treatment.  Overall hypermetabolism in the intra-abdominal lymph nodes has decreased and appears to be similar to the surrounding liver.  Plan is to complete total of 6 cycles of chemotherapy and he has completed 3 so far.  I will also consider getting clono seq testing when he comes for his next treatment  I will tentatively see him back in 10 days time for cycle 4   Visit Diagnosis 1. Diffuse large B-cell lymphoma of intra-abdominal lymph nodes (HCC)   2. Chemotherapy induced neutropenia (HCC)      Dr. Owens Shark, MD, MPH [facility] [phone] 03/02/2024 12:29 PM

## 2024-03-04 ENCOUNTER — Inpatient Hospital Stay

## 2024-03-04 DIAGNOSIS — C8333 Diffuse large B-cell lymphoma, intra-abdominal lymph nodes: Secondary | ICD-10-CM

## 2024-03-04 DIAGNOSIS — Z5112 Encounter for antineoplastic immunotherapy: Secondary | ICD-10-CM | POA: Diagnosis not present

## 2024-03-04 LAB — CBC WITH DIFFERENTIAL (CANCER CENTER ONLY)
Abs Immature Granulocytes: 0.3 10*3/uL — ABNORMAL HIGH (ref 0.00–0.07)
Basophils Absolute: 0.1 10*3/uL (ref 0.0–0.1)
Basophils Relative: 2 %
Eosinophils Absolute: 0.1 10*3/uL (ref 0.0–0.5)
Eosinophils Relative: 2 %
HCT: 36.5 % — ABNORMAL LOW (ref 39.0–52.0)
Hemoglobin: 12.1 g/dL — ABNORMAL LOW (ref 13.0–17.0)
Lymphocytes Relative: 23 %
Lymphs Abs: 1.3 10*3/uL (ref 0.7–4.0)
MCH: 29.6 pg (ref 26.0–34.0)
MCHC: 33.2 g/dL (ref 30.0–36.0)
MCV: 89.2 fL (ref 80.0–100.0)
Metamyelocytes Relative: 2 %
Monocytes Absolute: 0.5 10*3/uL (ref 0.1–1.0)
Monocytes Relative: 8 %
Myelocytes: 3 %
Neutro Abs: 3.5 10*3/uL (ref 1.7–7.7)
Neutrophils Relative %: 60 %
Platelet Count: 131 10*3/uL — ABNORMAL LOW (ref 150–400)
RBC: 4.09 MIL/uL — ABNORMAL LOW (ref 4.22–5.81)
RDW: 18.5 % — ABNORMAL HIGH (ref 11.5–15.5)
Smear Review: NORMAL
WBC Count: 5.8 10*3/uL (ref 4.0–10.5)
nRBC: 1 % — ABNORMAL HIGH (ref 0.0–0.2)

## 2024-03-08 ENCOUNTER — Inpatient Hospital Stay

## 2024-03-08 ENCOUNTER — Encounter: Payer: Self-pay | Admitting: Oncology

## 2024-03-08 ENCOUNTER — Other Ambulatory Visit: Payer: Self-pay

## 2024-03-08 ENCOUNTER — Other Ambulatory Visit: Payer: Self-pay | Admitting: *Deleted

## 2024-03-08 DIAGNOSIS — C8333 Diffuse large B-cell lymphoma, intra-abdominal lymph nodes: Secondary | ICD-10-CM

## 2024-03-08 DIAGNOSIS — Z5112 Encounter for antineoplastic immunotherapy: Secondary | ICD-10-CM | POA: Diagnosis not present

## 2024-03-08 LAB — CBC WITH DIFFERENTIAL (CANCER CENTER ONLY)
Abs Immature Granulocytes: 4.72 10*3/uL — ABNORMAL HIGH (ref 0.00–0.07)
Basophils Absolute: 0.1 10*3/uL (ref 0.0–0.1)
Basophils Relative: 1 %
Eosinophils Absolute: 0.1 10*3/uL (ref 0.0–0.5)
Eosinophils Relative: 1 %
HCT: 36.1 % — ABNORMAL LOW (ref 39.0–52.0)
Hemoglobin: 11.7 g/dL — ABNORMAL LOW (ref 13.0–17.0)
Immature Granulocytes: 24 %
Lymphocytes Relative: 8 %
Lymphs Abs: 1.6 10*3/uL (ref 0.7–4.0)
MCH: 29.1 pg (ref 26.0–34.0)
MCHC: 32.4 g/dL (ref 30.0–36.0)
MCV: 89.8 fL (ref 80.0–100.0)
Monocytes Absolute: 1.9 10*3/uL — ABNORMAL HIGH (ref 0.1–1.0)
Monocytes Relative: 10 %
Neutro Abs: 11.3 10*3/uL — ABNORMAL HIGH (ref 1.7–7.7)
Neutrophils Relative %: 56 %
Platelet Count: 220 10*3/uL (ref 150–400)
RBC: 4.02 MIL/uL — ABNORMAL LOW (ref 4.22–5.81)
RDW: 17.6 % — ABNORMAL HIGH (ref 11.5–15.5)
Smear Review: NORMAL
WBC Count: 19.8 10*3/uL — ABNORMAL HIGH (ref 4.0–10.5)
nRBC: 0.3 % — ABNORMAL HIGH (ref 0.0–0.2)

## 2024-03-08 LAB — CMP (CANCER CENTER ONLY)
ALT: 22 U/L (ref 0–44)
AST: 26 U/L (ref 15–41)
Albumin: 4.1 g/dL (ref 3.5–5.0)
Alkaline Phosphatase: 91 U/L (ref 38–126)
Anion gap: 9 (ref 5–15)
BUN: 10 mg/dL (ref 8–23)
CO2: 22 mmol/L (ref 22–32)
Calcium: 8.6 mg/dL — ABNORMAL LOW (ref 8.9–10.3)
Chloride: 104 mmol/L (ref 98–111)
Creatinine: 1.07 mg/dL (ref 0.61–1.24)
GFR, Estimated: >60 mL/min (ref >60)
Glucose, Bld: 93 mg/dL (ref 70–99)
Potassium: 4 mmol/L (ref 3.5–5.1)
Sodium: 135 mmol/L (ref 135–145)
Total Bilirubin: 0.5 mg/dL (ref 0.0–1.2)
Total Protein: 6.3 g/dL — ABNORMAL LOW (ref 6.5–8.1)

## 2024-03-08 LAB — MISCELLANEOUS TEST

## 2024-03-11 ENCOUNTER — Inpatient Hospital Stay

## 2024-03-11 ENCOUNTER — Encounter: Payer: Self-pay | Admitting: Oncology

## 2024-03-11 ENCOUNTER — Inpatient Hospital Stay (HOSPITAL_BASED_OUTPATIENT_CLINIC_OR_DEPARTMENT_OTHER): Admitting: Oncology

## 2024-03-11 VITALS — BP 136/78 | HR 92 | Temp 97.6°F | Resp 16

## 2024-03-11 VITALS — BP 128/90 | HR 72 | Temp 96.6°F | Resp 19 | Ht 64.0 in | Wt 170.0 lb

## 2024-03-11 DIAGNOSIS — Z5181 Encounter for therapeutic drug level monitoring: Secondary | ICD-10-CM

## 2024-03-11 DIAGNOSIS — C8333 Diffuse large B-cell lymphoma, intra-abdominal lymph nodes: Secondary | ICD-10-CM

## 2024-03-11 DIAGNOSIS — Z5111 Encounter for antineoplastic chemotherapy: Secondary | ICD-10-CM | POA: Diagnosis not present

## 2024-03-11 DIAGNOSIS — Z79620 Long term (current) use of immunosuppressive biologic: Secondary | ICD-10-CM

## 2024-03-11 DIAGNOSIS — Z5112 Encounter for antineoplastic immunotherapy: Secondary | ICD-10-CM | POA: Diagnosis not present

## 2024-03-11 LAB — CBC WITH DIFFERENTIAL/PLATELET
Abs Immature Granulocytes: 3.09 10*3/uL — ABNORMAL HIGH (ref 0.00–0.07)
Basophils Absolute: 0.3 10*3/uL — ABNORMAL HIGH (ref 0.0–0.1)
Basophils Relative: 1 %
Eosinophils Absolute: 0.1 10*3/uL (ref 0.0–0.5)
Eosinophils Relative: 0 %
HCT: 36.2 % — ABNORMAL LOW (ref 39.0–52.0)
Hemoglobin: 12 g/dL — ABNORMAL LOW (ref 13.0–17.0)
Immature Granulocytes: 15 %
Lymphocytes Relative: 8 %
Lymphs Abs: 1.6 10*3/uL (ref 0.7–4.0)
MCH: 29.6 pg (ref 26.0–34.0)
MCHC: 33.1 g/dL (ref 30.0–36.0)
MCV: 89.2 fL (ref 80.0–100.0)
Monocytes Absolute: 1.4 10*3/uL — ABNORMAL HIGH (ref 0.1–1.0)
Monocytes Relative: 7 %
Neutro Abs: 13.7 10*3/uL — ABNORMAL HIGH (ref 1.7–7.7)
Neutrophils Relative %: 69 %
Platelets: 231 10*3/uL (ref 150–400)
RBC: 4.06 MIL/uL — ABNORMAL LOW (ref 4.22–5.81)
RDW: 17.9 % — ABNORMAL HIGH (ref 11.5–15.5)
Smear Review: NORMAL
WBC: 20.1 10*3/uL — ABNORMAL HIGH (ref 4.0–10.5)
nRBC: 0.2 % (ref 0.0–0.2)

## 2024-03-11 LAB — CMP (CANCER CENTER ONLY)
ALT: 20 U/L (ref 0–44)
AST: 23 U/L (ref 15–41)
Albumin: 4 g/dL (ref 3.5–5.0)
Alkaline Phosphatase: 85 U/L (ref 38–126)
Anion gap: 8 (ref 5–15)
BUN: 10 mg/dL (ref 8–23)
CO2: 24 mmol/L (ref 22–32)
Calcium: 8.9 mg/dL (ref 8.9–10.3)
Chloride: 104 mmol/L (ref 98–111)
Creatinine: 0.88 mg/dL (ref 0.61–1.24)
GFR, Estimated: >60 mL/min (ref >60)
Glucose, Bld: 128 mg/dL — ABNORMAL HIGH (ref 70–99)
Potassium: 4 mmol/L (ref 3.5–5.1)
Sodium: 136 mmol/L (ref 135–145)
Total Bilirubin: 0.6 mg/dL (ref 0.0–1.2)
Total Protein: 6.5 g/dL (ref 6.5–8.1)

## 2024-03-11 MED ORDER — PALONOSETRON HCL INJECTION 0.25 MG/5ML
0.2500 mg | Freq: Once | INTRAVENOUS | Status: AC
  Administered 2024-03-11: 0.25 mg via INTRAVENOUS
  Filled 2024-03-11: qty 5

## 2024-03-11 MED ORDER — ACETAMINOPHEN 325 MG PO TABS
650.0000 mg | ORAL_TABLET | Freq: Once | ORAL | Status: AC
  Administered 2024-03-11: 650 mg via ORAL
  Filled 2024-03-11: qty 2

## 2024-03-11 MED ORDER — DIPHENHYDRAMINE HCL 25 MG PO CAPS
50.0000 mg | ORAL_CAPSULE | Freq: Once | ORAL | Status: AC
  Administered 2024-03-11: 50 mg via ORAL
  Filled 2024-03-11: qty 2

## 2024-03-11 MED ORDER — SODIUM CHLORIDE 0.9 % IV SOLN
INTRAVENOUS | Status: AC
  Filled 2024-03-11: qty 250

## 2024-03-11 MED ORDER — SODIUM CHLORIDE 0.9 % IV SOLN
375.0000 mg/m2 | Freq: Once | INTRAVENOUS | Status: AC
  Administered 2024-03-11: 700 mg via INTRAVENOUS
  Filled 2024-03-11: qty 20

## 2024-03-11 MED ORDER — VINCRISTINE SULFATE CHEMO INJECTION 1 MG/ML
Freq: Once | INTRAVENOUS | Status: AC
  Filled 2024-03-11: qty 11

## 2024-03-11 NOTE — Progress Notes (Signed)
 [facility] Clinical Social Work  Initial Assessment   William Mathews is a 63 y.o. year old male presenting alone. Clinical Social Work was referred by self for assessment of psychosocial needs.   SDOH (Social Determinants of Health) assessments performed: Yes   SDOH Screenings   Food Insecurity: No Food Insecurity (12/27/2023)  Housing: Unknown (12/27/2023)  Transportation Needs: No Transportation Needs (12/27/2023)  Utilities: Not At Risk (12/27/2023)  Depression (PHQ2-9): Low Risk  (12/27/2023)  Physical Activity: Inactive (12/27/2023)  Tobacco Use: Medium Risk (03/11/2024)     Distress Screen completed: No    12/27/2023   11:00 AM  ONCBCN DISTRESS SCREENING  Screening Type Initial Screening  Distress experienced in past week (1-10) 0      Family/Social Information:  Housing Arrangement: patient lives alone.  Patient was able to speak some [place]. Family members/support persons in your life? Family.  Patient has four children. Transportation concerns: no  Employment: Out on work excuse  Income source: Ecologist concerns: Yes, due to illness and/or loss of work during treatment Type of concern:  General Food access concerns: no Religious or spiritual practice: No Advanced directives: No Services Currently in place:  [facility]  Coping/ Adjustment to diagnosis: Patient understands treatment plan and what happens next? 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  Clinical Social Work Clinical Goal(s):  Explore community  resource options for unmet needs related to:  Financial Strain   Interventions: Discussed common feeling and emotions when being diagnosed with cancer, and the importance of support during treatment Informed patient of the support team roles and support services at [facility] Provided CSW contact information and encouraged patient to call with any questions or concerns Provided patient with information about grants.  CSW to make a referral to the [employer]ukemia [employer].   Follow Up Plan: CSW will follow-up with patient by phone  Patient verbalizes understanding of plan: Yes    Kennth Peal, LCSW Clinical Social Worker [facility]

## 2024-03-11 NOTE — Progress Notes (Signed)
 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  He had a CT abdomen and pelvis with contrast which showed large soft tissue mass along the mesentery measuring up to 21 cm with encasement of central mesenteric vessels and loss of tissue planes between the mass and several bowel loops.  Caliber change along the third portion of duodenum with partial mild obstruction.  Findings concerning for lymphoma.  Serum creatinine mildly elevated at 1.4.  Spleen slightly enlarged without intrinsic splenic mass.  Patient denies any significant nausea or vomiting.  He has early satiety   PET CT scan from 12/22/2023 showed Hypermetabolic left supraclavicular mediastinal retroperitoneal nodes along with large central mesenteric nodal mass consistent with lymphoma.  SUV in these areas ranging around SUV 16.  Central mesenteric nodal mass extending into the upper pelvis.  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  Cells positive for CD20 with coexpression of CD10 and BCL6 consistent with germinal center/folic killer phenotype and aberrantly express Bcl-2 strongly.  MOM1 and CD30 are negative.  EBV ISH  negative.  Proliferation ranges from 20 to 80% with overall average of 60 to 70%.  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       Patient received Garlon Hatchet [facility] chemotherapy for cycle 1.  Double hit testing showed make gene rearrangement at 75%. Although Bcl-2 and BCL6 rearrangement was detected on FISH these were not known partners to make and therefore it was reported as a pseudo double hit.  However given the fact that this is a transformed diffuse large B-cell lymphoma from a pre-existing follicular lymphoma this would fit the pattern of a double hit lymphoma/ high grade B cell lymphoma.  Plan was therefore made to switch him to dose adjusted R-EPOCH chemotherapy for the remainder of the cycles   Initially plan was not to offer CNS prophylaxis however given that his double hit lymphoma with an intermediate risk score of 3 intrathecal methotrexate will be given if tolerated for 4 cycles  Interval history- headaches lasted for 10 days after IT methotrexate but have now eventually resolved. He is unsure if he wants to proceed with another dose this cycle  ECOG PS- 0 Pain scale- 0   Review of systems- Review of Systems  Constitutional:  Negative for chills, fever, malaise/fatigue and weight loss.  HENT:  Negative for congestion, ear discharge and nosebleeds.   Eyes:  Negative for blurred vision.  Respiratory:  Negative for cough, hemoptysis, sputum production, shortness of breath and wheezing.   Cardiovascular:  Negative for chest pain, palpitations, orthopnea and claudication.  Gastrointestinal:  Negative for abdominal pain, blood in stool, constipation, diarrhea, heartburn, melena, nausea and vomiting.  Genitourinary:  Negative for dysuria, flank pain, frequency, hematuria and urgency.  Musculoskeletal:   Negative for back pain, joint pain and myalgias.  Skin:  Negative for rash.  Neurological:  Negative for dizziness, tingling, focal weakness, seizures, weakness and headaches.  Endo/Heme/Allergies:  Does not bruise/bleed easily.  Psychiatric/Behavioral:  Negative for depression and suicidal ideas. The patient does not have insomnia.       No Known Allergies   History reviewed. No pertinent past medical history.   Past Surgical History:  Procedure Laterality Date   FISTULOTOMY N/A 01/10/2018   Procedure: FISTULOTOMY;  Surgeon: Eldred Grego, MD;  Location: [facility] ORS;  Service: General;  Laterality: N/A;   MASS BIOPSY Left 12/28/2023   Procedure: NECK MASS BIOPSY;  Surgeon: Alben Alma, MD;  Location: [facility] ORS;  Service: General;  Laterality: Left;   PORTACATH PLACEMENT N/A 12/28/2023   Procedure: INSERTION PORT-A-CATH;  Surgeon: Alben Alma, MD;  Location: [facility] ORS;  Service: General;  Laterality: N/A;    Social History   Socioeconomic History   Marital status: Single    Spouse name: Not on file   Number of children: 4   Years of education: Not on file   Highest education level: Not on file  Occupational History   Not on file  Tobacco Use   Smoking status: Former    Types: Cigarettes    Passive exposure: Past   Smokeless tobacco: Never  Vaping Use   Vaping status: Never Used  Substance and Sexual Activity   Alcohol use: Not Currently   Drug use: Not Currently    Types: Cocaine    Comment: years ago    Sexual activity: Yes  Other Topics Concern   Not on file  Social History Narrative   Not on file   Social Drivers of Health   Financial Resource Strain: Not on file  Food Insecurity: No Food Insecurity (12/27/2023)   Hunger Vital Sign    Worried About Running Out of Food in the Last Year: Never true    Ran Out of Food in the Last Year: Never true  Transportation Needs: No Transportation Needs (12/27/2023)   PRAPARE - Scientist, research (physical sciences) (Medical): No    Lack of Transportation (Non-Medical): No  Physical Activity: Inactive (12/27/2023)   Exercise Vital Sign    Days of Exercise per Week: 0 days    Minutes of Exercise per Session: 0 min  Stress: Not on file  Social Connections: Not on file  Intimate Partner Violence: Not At Risk (12/27/2023)   Humiliation, Afraid, Rape, and Kick questionnaire    Fear of Current or Ex-Partner: No    Emotionally Abused: No    Physically Abused: No    Sexually Abused: No    Family History  Problem Relation Age of Onset   Colon cancer Mother    Pancreatic cancer Father    Cancer Sister      Current Outpatient Medications:    acyclovir (ZOVIRAX) 400 MG tablet, Take 400 mg by mouth daily., Disp: , Rfl:    HYDROcodone-acetaminophen (NORCO/VICODIN) 5-325 MG tablet, Take 1 tablet by mouth every 6 (six) hours as needed for moderate pain (pain score 4-6). (Patient not taking: Reported on 03/01/2024), Disp: 15 tablet, Rfl: 0   predniSONE (DELTASONE) 20 MG tablet, Take 5 tablets (100 mg total) by mouth daily with breakfast. (Patient not taking:  Reported on 03/01/2024), Disp: 35 tablet, Rfl: 2   predniSONE (DELTASONE) 20 MG tablet, Take 1 tablet (20 mg total) by mouth daily with breakfast. (Patient not taking: Reported on 03/01/2024), Disp: 2 tablet, Rfl: 0   sulfamethoxazole-trimethoprim (BACTRIM DS) 800-160 MG tablet, Take 1 tablet by mouth 2 (two) times daily., Disp: , Rfl:  No current facility-administered medications for this visit.  Facility-Administered Medications Ordered in Other Visits:    0.9 %  sodium chloride infusion, , Intravenous, Continuous, Avonne Boettcher, MD, Stopped at 01/29/24 1143   0.9 %  sodium chloride infusion, , Intravenous, Continuous, Avonne Boettcher, MD, Last Rate: 10 mL/hr at 03/11/24 1006, New Bag at 03/11/24 1006   DOXOrubicin (ADRIAMYCIN) 22 mg, etoposide (VEPESID) 110 mg, vinCRIStine (ONCOVIN) 0.7 mg in sodium chloride 0.9 % 500 mL chemo infusion, ,  Intravenous, Once, Avonne Boettcher, MD   sodium chloride flush (NS) 0.9 % injection 10 mL, 10 mL, Intracatheter, PRN, Avonne Boettcher, MD, 10 mL at 01/30/24 1254  Physical exam:  Vitals:   03/11/24 0905  BP: (!) 128/90  Pulse: 72  Resp: 19  Temp: (!) 96.6 F (35.9 C)  TempSrc: Tympanic  SpO2: 100%  Weight: 170 lb (77.1 kg)  Height: 5\' 4"  (1.626 m)   Physical Exam Cardiovascular:     Rate and Rhythm: Normal rate and regular rhythm.     Heart sounds: Normal heart sounds.  Pulmonary:     Effort: Pulmonary effort is normal.     Breath sounds: Normal breath sounds.  Skin:    General: Skin is warm and dry.  Neurological:     Mental Status: He is alert and oriented to person, place, and time.      I have personally reviewed labs listed below:    Latest Ref Rng & Units 03/11/2024    8:46 AM  CMP  Glucose 70 - 99 mg/dL 782   BUN 8 - 23 mg/dL 10   Creatinine 9.56 - 1.24 mg/dL 2.13   Sodium 086 - 578 mmol/L 136   Potassium 3.5 - 5.1 mmol/L 4.0   Chloride 98 - 111 mmol/L 104   CO2 22 - 32 mmol/L 24   Calcium 8.9 - 10.3 mg/dL 8.9   Total Protein 6.5 - 8.1 g/dL 6.5   Total Bilirubin 0.0 - 1.2 mg/dL 0.6   Alkaline Phos 38 - 126 U/L 85   AST 15 - 41 U/L 23   ALT 0 - 44 U/L 20       Latest Ref Rng & Units 03/11/2024    8:43 AM  CBC  WBC 4.0 - 10.5 K/uL 20.1   Hemoglobin 13.0 - 17.0 g/dL 46.9   Hematocrit 62.9 - 52.0 % 36.2   Platelets 150 - 400 K/uL 231    I have personally reviewed Radiology images listed below: No images are attached to the encounter.  NM PET Image Restag (PS) Skull Base To Thigh Result Date: 03/02/2024 CLINICAL DATA:  Subsequent treatment strategy for large B-cell lymphoma. EXAM: NUCLEAR MEDICINE PET SKULL BASE TO THIGH TECHNIQUE: 9.37 mCi F-18 FDG was injected intravenously. Full-ring PET imaging was performed from the skull base to thigh after the radiotracer. CT data was obtained and used for attenuation correction and anatomic localization. Fasting  blood glucose: 97 mg/dl COMPARISON:  Prior PET-CT 12/22/2023 FINDINGS: Mediastinal blood pool activity: SUV max 1.96 Liver activity: SUV max 3.27 NECK: Minimal residual matted soft tissue density in the left supraclavicular area along with small surgical  clips suggesting prior surgical excisional biopsy. No residual measurable disease or hypermetabolism (Deauville 1). Incidental CT findings: None. CHEST: No hypermetabolic mediastinal or hilar nodes. No suspicious pulmonary nodules on the CT scan. Incidental CT findings: The left-sided Port-A-Cath is in good position. ABDOMEN/PELVIS: The extensive/bulky mesenteric and retroperitoneal lymphadenopathy seen on the prior PET-CT has largely resolved. There is a matted masslike area of residual soft tissue density in the root of the small bowel mesentery measuring approximately 9.4 x 4.0 cm on image 103/6. Residual low level FDG uptake with SUV max of 2.55 and previously 13.3 (Deauville 3). Ill-defined matted soft tissue density in the retro per also consistent with treated tumor. SUV max is 2.54 and was previously 17.84 (Deauville 3). No residual measurable pelvic adenopathy or hypermetabolism (Deauville 1). No splenomegaly or splenic hypermetabolic lesions. Incidental CT findings: Stable scattered vascular calcifications. Abdominal/pelvic ascites seen on the prior PET-CT has resolved. SKELETON: Mild diffuse marrow hypermetabolism likely treatment related. Incidental CT findings: None. IMPRESSION: 1. PET-CT findings suggest a good response to treatment as detailed above. 2. No residual measurable or hypermetabolic left supraclavicular or pelvic adenopathy (Deauville 1). 3. Matted/treated soft tissue density in the mesentery and retroperitoneum with very low level FDG uptake consistent with treated disease (Deauville 3). 4. No splenomegaly or splenic hypermetabolic lesions. 5. Mild diffuse marrow hypermetabolism likely treatment related. Electronically Signed   By: Marrian Siva M.D.   On: 03/02/2024 11:07   DG FL LUMBAR PUNCTURE W/CHEMO INJECT Result Date: 02/27/2024 CLINICAL DATA:  Patient with diffuse large B-cell lymphoma who is here today for intrathecal injection of methotrexate and hydrocortisone per his oncology provider. EXAM: FLUOROSCOPICALLY GUIDED LUMBAR PUNCTURE FOR INTRATHECAL CHEMOTHERAPY COMPARISON:  None. FLUOROSCOPY TIME:  Radiation Exposure Index (if provided by the fluoroscopic device): 18 seconds, 1.9 mGy PROCEDURE: Informed consent was obtained from the patient prior to the procedure, including potential complications or reactions to the medication, seizures, bleeding, infection, paresthesias, nerve damage, CSF leak requiring additional procedures, post procedure requirement to lay flat for several hours after the procedure, headache, allergy, and pain. With the patient prone, the lower back was prepped with Betadine. 1% lidocaine was used for local anesthesia. Lumbar puncture was performed at the L4-L5 level using a 20 gauge needle with return of colorless CSF with an opening pressure of 14 cm water. A pre-filled 10 mL sterile syringe from the pharmacy containing 12 mg of methotrexate with 50 mg of hydrocortisone sodium succinate was injected into the subarachnoid space. The inner stylet was placed back into the needle and the needle was removed. The patient tolerated the procedure well and there were no apparent complications. A sterile bandage was applied. IMPRESSION: Successful fluoroscopic guided lumbar puncture and intrathecal injection of chemotherapy, without complication. This exam was performed by Sherline Distel PA-C, and was supervised and interpreted by Dr. Ethelle Herb. Electronically Signed   By: Denny Flack M.D.   On: 02/27/2024 12:28     Assessment and plan- Patient is a 63 y.o. 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  He is s/p post 1 cycle of Pola R CHP chemotherapy and was subsequently switched to  dose adjusted R-EPOCH chemotherapy.  He is here for on treatment assessment prior to cycle 3 of dose adjusted R-EPOCH chemotherapy  Counts okay to proceed with cycle 3 of dose adjusted R-EPOCH chemotherapy today.  ANC has remained more than 0.5 and only went down to less than 0.5 on 1 occasion.  He  received 20% dose increase for cycle 2 and will keep the same for cycle 3 today.  I will see him back in 3 weeks for cycle 4  Patient received 1 dose of intrathecal methotrexate with cycle 2 of treatment but had significant headaches lasting for 10 days despite hydrocortisone and steroids.  He is unsure if he wants to proceed with 1 more dose of intrathecal methotrexate at this time.  Will let us  know about it in 3 days time.  PET scan results were also officially back And show matted treated soft tissue density in the mesentery and retroperitoneum with very low-level FDG uptake consistent with treated disease although reported as Deauville 3.  Have also sent off clonal seq testing on his peripheral blood.  So far he has tolerated dose adjusted R-EPOCH chemotherapy very well without any significant side effects other than headaches from intrathecal methotrexate  Leukocytosis/neutrophilia likely secondary to Neulasta and steroid use.  No signs and symptoms of infection.  Continue to monitor   Visit Diagnosis 1. Diffuse large B-cell lymphoma of intra-abdominal lymph nodes (HCC)   2. Encounter for antineoplastic chemotherapy   3. Encounter for monitoring rituximab therapy      Dr. Seretha Dance, MD, MPH [facility] at [facility] [phone] 03/11/2024 11:53 AM

## 2024-03-11 NOTE — Patient Instructions (Signed)

## 2024-03-11 NOTE — Patient Instructions (Signed)
 Instrucciones al darle de alta: Discharge Instructions Gracias por elegir al [facility] de Cncer de [place] para brindarle atencin mdica de oncologa y Teacher, English as a foreign language.   Si usted tiene una cita de laboratorio con [employer] de [place], por favor vaya directamente al [employer] de Cncer y regstrese en el rea de Engineer, maintenance (IT).   Use ropa cmoda y [place] para tener fcil acceso a las vas del Portacath (acceso venoso de Set designer duracin) o la lnea PICC (catter central colocado por va perifrica).   Nos esforzamos por ofrecerle tiempo de calidad con su proveedor. Es posible que tenga que volver a programar su cita si llega tarde (15 minutos o ms).  El llegar tarde le afecta a usted y a otros pacientes cuyas citas son posteriores a Armed forces operational officer.  Adems, si usted falta a tres o ms citas sin avisar a la oficina, puede ser retirado(a) de la clnica a discrecin del proveedor.      Para las solicitudes de renovacin de recetas, pida a su farmacia que se ponga en contacto con nuestra oficina y deje que transcurran 72 horas para que se complete el proceso de las renovaciones.    Hoy usted recibi los siguientes agentes de quimioterapia e/o inmunoterapia etoposide, doxorubicin, vincristine      Para ayudar a prevenir las nuseas y los vmitos despus de su tratamiento, le recomendamos que tome su medicamento para las nuseas segn las indicaciones.  LOS SNTOMAS QUE DEBEN COMUNICARSE INMEDIATAMENTE SE INDICAN A CONTINUACIN: *FIEBRE SUPERIOR A 100.4 F (38 C) O MS *ESCALOFROS O SUDORACIN *NUSEAS Y VMITOS QUE NO SE CONTROLAN CON EL MEDICAMENTO PARA LAS NUSEAS *DIFICULTAD INUSUAL PARA RESPIRAR  *MORETONES O HEMORRAGIAS NO HABITUALES *PROBLEMAS URINARIOS (dolor o ardor al Geographical information systems officer o frecuencia para Geographical information systems officer) *PROBLEMAS INTESTINALES (diarrea inusual, estreimiento, dolor cerca del ano) SENSIBILIDAD EN LA BOCA Y EN LA GARGANTA CON O SIN LA PRESENCIA DE LCERAS (dolor de garganta, llagas en la boca o dolor de  muelas/dientes) ERUPCIN, HINCHAZN O DOLORES INUSUALES FLUJO VAGINAL INUSUAL O PICAZN/RASQUIA    Los puntos marcados con un asterisco ( *) indican una posible emergencia y debe hacer un seguimiento tan pronto como le sea posible o vaya al Departamento de Emergencias si se le presenta algn problema.  Por favor, muestre la [place] DE ADVERTENCIA DE Marc Morgans DE ADVERTENCIA DE [place] Fanti al registrarse en [address] de Emergencias y a la enfermera de triaje.  Si tiene preguntas despus de su visita o necesita cancelar o volver a programar su cita, por favor pngase en contacto con [facility] - A DEPT OF [facility]igh[facility] Brideg[facility]  [phone]  y siga las instrucciones. Las horas de oficina son de 8:00 a.m. a 4:30 p.m. de lunes a viernes. Por favor, tenga en cuenta que los mensajes de voz que se dejan despus de las 4:00 p.m. posiblemente no se devolvern hasta el siguiente da de [place].  Cerramos los fines de semana y [employer]. En todo momento tiene acceso a una enfermera para preguntas urgentes. Por favor, llame al nmero principal de la clnica  [phone] y siga las instrucciones.   Para cualquier pregunta que no sea de carcter urgente, tambin puede ponerse en contacto con su proveedor [employer]. Ahora ofrecemos visitas electrnicas para cualquier persona mayor de 18 aos que solicite atencin mdica en lnea para los sntomas que no sean urgentes. Para ms detalles vaya a [email or web address].   Tambin puede bajar la aplicacin de MyChart! Laurena Bering  a la tienda de aplicaciones, busque "MyChart", abra la aplicacin, seleccione [place], e ingrese con su nombre de usuario y la contrasea de Clinical cytogeneticist.

## 2024-03-12 ENCOUNTER — Inpatient Hospital Stay

## 2024-03-12 ENCOUNTER — Other Ambulatory Visit: Payer: Self-pay

## 2024-03-12 VITALS — BP 125/76 | HR 95 | Temp 98.3°F | Resp 16

## 2024-03-12 DIAGNOSIS — Z5112 Encounter for antineoplastic immunotherapy: Secondary | ICD-10-CM | POA: Diagnosis not present

## 2024-03-12 DIAGNOSIS — C8333 Diffuse large B-cell lymphoma, intra-abdominal lymph nodes: Secondary | ICD-10-CM

## 2024-03-12 MED ORDER — VINCRISTINE SULFATE CHEMO INJECTION 1 MG/ML
Freq: Once | INTRAVENOUS | Status: AC
  Filled 2024-03-12: qty 11

## 2024-03-12 NOTE — Patient Instructions (Signed)
 Instrucciones al darle de alta: Discharge Instructions Gracias por elegir al [facility] de Cncer de [place] para brindarle atencin mdica de oncologa y Teacher, English as a foreign language.   Si usted tiene una cita de laboratorio con [employer] de [place], por favor vaya directamente al [employer] de Cncer y regstrese en el rea de Engineer, maintenance (IT).   Use ropa cmoda y [place] para tener fcil acceso a las vas del Portacath (acceso venoso de Set designer duracin) o la lnea PICC (catter central colocado por va perifrica).   Nos esforzamos por ofrecerle tiempo de calidad con su proveedor. Es posible que tenga que volver a programar su cita si llega tarde (15 minutos o ms).  El llegar tarde le afecta a usted y a otros pacientes cuyas citas son posteriores a Armed forces operational officer.  Adems, si usted falta a tres o ms citas sin avisar a la oficina, puede ser retirado(a) de la clnica a discrecin del proveedor.      Para las solicitudes de renovacin de recetas, pida a su farmacia que se ponga en contacto con nuestra oficina y deje que transcurran 72 horas para que se complete el proceso de las renovaciones.    Hoy usted recibi los siguientes agentes de quimioterapia e/o inmunoterapia etoposide, doxorubicin, vincristine      Para ayudar a prevenir las nuseas y los vmitos despus de su tratamiento, le recomendamos que tome su medicamento para las nuseas segn las indicaciones.  LOS SNTOMAS QUE DEBEN COMUNICARSE INMEDIATAMENTE SE INDICAN A CONTINUACIN: *FIEBRE SUPERIOR A 100.4 F (38 C) O MS *ESCALOFROS O SUDORACIN *NUSEAS Y VMITOS QUE NO SE CONTROLAN CON EL MEDICAMENTO PARA LAS NUSEAS *DIFICULTAD INUSUAL PARA RESPIRAR  *MORETONES O HEMORRAGIAS NO HABITUALES *PROBLEMAS URINARIOS (dolor o ardor al Geographical information systems officer o frecuencia para Geographical information systems officer) *PROBLEMAS INTESTINALES (diarrea inusual, estreimiento, dolor cerca del ano) SENSIBILIDAD EN LA BOCA Y EN LA GARGANTA CON O SIN LA PRESENCIA DE LCERAS (dolor de garganta, llagas en la boca o dolor de  muelas/dientes) ERUPCIN, HINCHAZN O DOLORES INUSUALES FLUJO VAGINAL INUSUAL O PICAZN/RASQUIA    Los puntos marcados con un asterisco ( *) indican una posible emergencia y debe hacer un seguimiento tan pronto como le sea posible o vaya al Departamento de Emergencias si se le presenta algn problema.  Por favor, muestre la [place] DE ADVERTENCIA DE Marc Morgans DE ADVERTENCIA DE [place] Fanti al registrarse en [address] de Emergencias y a la enfermera de triaje.  Si tiene preguntas despus de su visita o necesita cancelar o volver a programar su cita, por favor pngase en contacto con [facility] - A DEPT OF [facility]igh[facility] Brideg[facility]  [phone]  y siga las instrucciones. Las horas de oficina son de 8:00 a.m. a 4:30 p.m. de lunes a viernes. Por favor, tenga en cuenta que los mensajes de voz que se dejan despus de las 4:00 p.m. posiblemente no se devolvern hasta el siguiente da de [place].  Cerramos los fines de semana y [employer]. En todo momento tiene acceso a una enfermera para preguntas urgentes. Por favor, llame al nmero principal de la clnica  [phone] y siga las instrucciones.   Para cualquier pregunta que no sea de carcter urgente, tambin puede ponerse en contacto con su proveedor [employer]. Ahora ofrecemos visitas electrnicas para cualquier persona mayor de 18 aos que solicite atencin mdica en lnea para los sntomas que no sean urgentes. Para ms detalles vaya a [email or web address].   Tambin puede bajar la aplicacin de MyChart! Laurena Bering  a la tienda de aplicaciones, busque "MyChart", abra la aplicacin, seleccione [place], e ingrese con su nombre de usuario y la contrasea de Clinical cytogeneticist.

## 2024-03-13 ENCOUNTER — Encounter: Payer: Self-pay | Admitting: Oncology

## 2024-03-13 ENCOUNTER — Inpatient Hospital Stay

## 2024-03-13 ENCOUNTER — Telehealth: Payer: Self-pay

## 2024-03-13 ENCOUNTER — Telehealth: Payer: Self-pay | Admitting: *Deleted

## 2024-03-13 VITALS — BP 121/79 | HR 94 | Temp 98.2°F | Resp 18

## 2024-03-13 DIAGNOSIS — C8333 Diffuse large B-cell lymphoma, intra-abdominal lymph nodes: Secondary | ICD-10-CM

## 2024-03-13 DIAGNOSIS — Z5112 Encounter for antineoplastic immunotherapy: Secondary | ICD-10-CM | POA: Diagnosis not present

## 2024-03-13 MED ORDER — PALONOSETRON HCL INJECTION 0.25 MG/5ML
0.2500 mg | Freq: Once | INTRAVENOUS | Status: AC
Start: 2024-03-13 — End: 2024-03-13
  Administered 2024-03-13: 0.25 mg via INTRAVENOUS
  Filled 2024-03-13: qty 5

## 2024-03-13 MED ORDER — VINCRISTINE SULFATE CHEMO INJECTION 1 MG/ML
Freq: Once | INTRAVENOUS | Status: AC
  Filled 2024-03-13: qty 11

## 2024-03-13 NOTE — Telephone Encounter (Signed)
 They company needed to know the date of the specimen that the got.  The specimen was blood and it was done 4/11. Also the staff is sending the pathology  notes for this pt. That is all they needed.

## 2024-03-13 NOTE — Telephone Encounter (Signed)
 LTD paperwork received.  Complete with proposed end date of 04/28/24 (after last tx), pending Dr. Randy Buttery signature.

## 2024-03-14 ENCOUNTER — Inpatient Hospital Stay

## 2024-03-14 ENCOUNTER — Telehealth: Payer: Self-pay

## 2024-03-14 VITALS — BP 120/70 | HR 89 | Temp 97.8°F | Resp 18

## 2024-03-14 DIAGNOSIS — Z5112 Encounter for antineoplastic immunotherapy: Secondary | ICD-10-CM | POA: Diagnosis not present

## 2024-03-14 DIAGNOSIS — C8333 Diffuse large B-cell lymphoma, intra-abdominal lymph nodes: Secondary | ICD-10-CM

## 2024-03-14 MED ORDER — VINCRISTINE SULFATE CHEMO INJECTION 1 MG/ML
Freq: Once | INTRAVENOUS | Status: AC
  Filled 2024-03-14: qty 11

## 2024-03-14 NOTE — Telephone Encounter (Signed)
 Outbound call to patient with [employer] Spanish interpreter # [phone] / Jonelle Neri whom called [phone].  Voice message left for patient to return call to clinic.

## 2024-03-14 NOTE — Telephone Encounter (Signed)
 Patient's daughter Carrolyn Clan is returning your call.  You can call her to discuss patient at [phone].

## 2024-03-14 NOTE — Telephone Encounter (Signed)
 Outbound call to caregiver / daughter Carrolyn Clan as requested.  Daughter states the patient is willing to try intrathecal chemo one more time.  Stated after his last treatment anytime he would sit up he would have a terrible headache; no pain when lying down.  Annabelle also asked if they contact her to schedule for next Tuesday since she needs to put in a time off request from work.  Caregiver has no additional questions nor concerns at this time.

## 2024-03-15 ENCOUNTER — Encounter: Payer: Self-pay | Admitting: Oncology

## 2024-03-15 ENCOUNTER — Other Ambulatory Visit: Payer: Self-pay

## 2024-03-15 ENCOUNTER — Other Ambulatory Visit: Payer: Self-pay | Admitting: Oncology

## 2024-03-15 ENCOUNTER — Inpatient Hospital Stay

## 2024-03-15 ENCOUNTER — Telehealth: Payer: Self-pay

## 2024-03-15 VITALS — BP 110/68 | HR 89 | Temp 98.2°F | Resp 18

## 2024-03-15 DIAGNOSIS — Z5112 Encounter for antineoplastic immunotherapy: Secondary | ICD-10-CM | POA: Diagnosis not present

## 2024-03-15 DIAGNOSIS — C8333 Diffuse large B-cell lymphoma, intra-abdominal lymph nodes: Secondary | ICD-10-CM

## 2024-03-15 MED ORDER — HEPARIN SOD (PORK) LOCK FLUSH 100 UNIT/ML IV SOLN
500.0000 [IU] | Freq: Once | INTRAVENOUS | Status: AC | PRN
  Administered 2024-03-15: 500 [IU]
  Filled 2024-03-15: qty 5

## 2024-03-15 MED ORDER — SODIUM CHLORIDE 0.9 % IV SOLN
INTRAVENOUS | Status: DC
  Filled 2024-03-15: qty 250

## 2024-03-15 MED ORDER — SODIUM CHLORIDE 0.9 % IV SOLN
900.0000 mg/m2 | Freq: Once | INTRAVENOUS | Status: AC
  Administered 2024-03-15: 1500 mg via INTRAVENOUS
  Filled 2024-03-15: qty 75

## 2024-03-15 MED ORDER — PREDNISONE 20 MG PO TABS: 20.0000 mg | ORAL_TABLET | Freq: Every day | ORAL | 0 refills | Status: DC

## 2024-03-15 MED ORDER — PALONOSETRON HCL INJECTION 0.25 MG/5ML
0.2500 mg | Freq: Once | INTRAVENOUS | Status: AC
  Administered 2024-03-15: 0.25 mg via INTRAVENOUS
  Filled 2024-03-15: qty 5

## 2024-03-15 NOTE — Patient Instructions (Signed)
 Instrucciones al darle de alta: Discharge Instructions Gracias por elegir al [facility] de Cncer de [place] para brindarle atencin mdica de oncologa y Teacher, English as a foreign language.   Si usted tiene una cita de laboratorio con [employer] de [place], por favor vaya directamente al [employer] de Cncer y regstrese en el rea de Engineer, maintenance (IT).   Use ropa cmoda y [place] para tener fcil acceso a las vas del Portacath (acceso venoso de Set designer duracin) o la lnea PICC (catter central colocado por va perifrica).   Nos esforzamos por ofrecerle tiempo de calidad con su proveedor. Es posible que tenga que volver a programar su cita si llega tarde (15 minutos o ms).  El llegar tarde le afecta a usted y a otros pacientes cuyas citas son posteriores a Armed forces operational officer.  Adems, si usted falta a tres o ms citas sin avisar a la oficina, puede ser retirado(a) de la clnica a discrecin del proveedor.      Para las solicitudes de renovacin de recetas, pida a su farmacia que se ponga en contacto con nuestra oficina y deje que transcurran 72 horas para que se complete el proceso de las renovaciones.     Para ayudar a prevenir las nuseas y los vmitos despus de su tratamiento, le recomendamos que tome su medicamento para las nuseas segn las indicaciones.  LOS SNTOMAS QUE DEBEN COMUNICARSE INMEDIATAMENTE SE INDICAN A CONTINUACIN: *FIEBRE SUPERIOR A 100.4 F (38 C) O MS *ESCALOFROS O SUDORACIN *NUSEAS Y VMITOS QUE NO SE CONTROLAN CON EL MEDICAMENTO PARA LAS NUSEAS *DIFICULTAD INUSUAL PARA RESPIRAR  *MORETONES O HEMORRAGIAS NO HABITUALES *PROBLEMAS URINARIOS (dolor o ardor al Geographical information systems officer o frecuencia para Geographical information systems officer) *PROBLEMAS INTESTINALES (diarrea inusual, estreimiento, dolor cerca del ano) SENSIBILIDAD EN LA BOCA Y EN LA GARGANTA CON O SIN LA PRESENCIA DE LCERAS (dolor de garganta, llagas en la boca o dolor de muelas/dientes) ERUPCIN, HINCHAZN O DOLORES INUSUALES FLUJO VAGINAL INUSUAL O PICAZN/RASQUIA    Los puntos marcados  con un asterisco ( *) indican una posible emergencia y debe hacer un seguimiento tan pronto como le sea posible o vaya al Departamento de Emergencias si se le presenta algn problema.  Por favor, muestre la [place] DE ADVERTENCIA DE Marc Morgans DE ADVERTENCIA DE Gard[place] Fanti al registrarse en [address] de Emergencias y a la enfermera de triaje.  Si tiene preguntas despus de su visita o necesita cancelar o volver a programar su cita, por favor pngase en contacto con [facility] - A DEPT OF [facility]igha Bridegroom [facility]  [phone]  y siga las instrucciones. Las horas de oficina son de 8:00 a.m. a 4:30 p.m. de lunes a viernes. Por favor, tenga en cuenta que los mensajes de voz que se dejan despus de las 4:00 p.m. posiblemente no se devolvern hasta el siguiente da de [place].  Cerramos los fines de semana y [employer]. En todo momento tiene acceso a una enfermera para preguntas urgentes. Por favor, llame al nmero principal de la clnica  [phone] y siga las instrucciones.   Para cualquier pregunta que no sea de carcter urgente, tambin puede ponerse en contacto con su proveedor [employer]. Ahora ofrecemos visitas electrnicas para cualquier persona mayor de 18 aos que solicite atencin mdica en lnea para los sntomas que no sean urgentes. Para ms detalles vaya a [email or web address].   Tambin puede bajar la aplicacin de MyChart! Vaya a la tienda de aplicaciones, busque "MyChart", abra la aplicacin, seleccione , e ingrese con su  nombre de usuario y la contrasea de Clinical cytogeneticist.

## 2024-03-15 NOTE — Telephone Encounter (Signed)
 Outbound call to [employer], spoke to [place].  Informed of upcoming intrathecal methotrexate  chemo scheduled 03/19/24 at 10 am.  According to [place], Greig Hummer is no longer in current role and is assisting with a system wide initiative.  Informed best person to contact for future is Olam Fritter.  Email sent to Olam Fritter cc: Dr. Melanee with same information provided above.

## 2024-03-15 NOTE — Telephone Encounter (Signed)
 Outbound call to daughter to confirm if intrathecal chemo appointment for next Tuesday 03/19/24 at 10AM.  Per appointment note [facility] to confirm date and time pt to come.  Caregiver states she already spoke with someone yesterday and was informed to arrive 30 minutes prior. Caregiver has no additional questions at this time.

## 2024-03-15 NOTE — Telephone Encounter (Signed)
 LTD paperwork completed and faxed.

## 2024-03-15 NOTE — Telephone Encounter (Signed)
 Followed up with Special Nurses Desk to confirm updated order is linked to intrathecal chemo appointment scheduled next Tuesday and Dr.  Randy Buttery would like to know which radiologist is doing the procedure so she can talk to him prior.  Spoke with William Mathews who indicated that she cannot see the updated order in the system yet reflecting "Per Dr. Randy Buttery, md - would like equal amount of csf to be drained prior to injecting MTX and fluid sent for flow cytometry".  She has left a follow up for the team on Monday to review the updated order is reflected and will also ask the Monday staff to communicate with those there Tuesday to follow through.  Dr. Julietta Ogren [phone] is listed as the IR; William Mathews indicated that it's usually the NP or PA that does the cytometry but there is no one specifically listed for William Mathews's scheduled appointment; it just reads "radiology" at this time.

## 2024-03-18 ENCOUNTER — Encounter: Payer: Self-pay | Admitting: Oncology

## 2024-03-18 ENCOUNTER — Inpatient Hospital Stay

## 2024-03-18 DIAGNOSIS — C8333 Diffuse large B-cell lymphoma, intra-abdominal lymph nodes: Secondary | ICD-10-CM

## 2024-03-18 DIAGNOSIS — Z5112 Encounter for antineoplastic immunotherapy: Secondary | ICD-10-CM | POA: Diagnosis not present

## 2024-03-18 MED ORDER — SODIUM CHLORIDE (PF) 0.9 % IJ SOLN
Freq: Once | INTRAMUSCULAR | Status: AC
  Filled 2024-03-18: qty 0.48

## 2024-03-18 MED ORDER — PEGFILGRASTIM-CBQV 6 MG/0.6ML ~~LOC~~ SOSY
6.0000 mg | PREFILLED_SYRINGE | Freq: Once | SUBCUTANEOUS | Status: AC
  Administered 2024-03-18: 6 mg via SUBCUTANEOUS
  Filled 2024-03-18: qty 0.6

## 2024-03-18 NOTE — Progress Notes (Signed)
 Patient for DG Methotrexate  Inj (Chemo) on Tues 03/19/24, I called and spoke with the patient's daughter, Annabell on the phone and gave pre-procedure instructions. Annabell was made aware to have the patient here at 9:30a and check in at the new entrance. Annabell stated understanding.  Called 03/14/24 INTERPRETER REQUESTED

## 2024-03-19 ENCOUNTER — Ambulatory Visit
Admission: RE | Admit: 2024-03-19 | Discharge: 2024-03-19 | Disposition: A | Discharge status: Home / Self Care | Source: Ambulatory Visit | Attending: Oncology | Admitting: Oncology

## 2024-03-19 ENCOUNTER — Encounter: Payer: Self-pay | Admitting: Oncology

## 2024-03-19 ENCOUNTER — Other Ambulatory Visit: Payer: Self-pay

## 2024-03-19 DIAGNOSIS — C8333 Diffuse large B-cell lymphoma, intra-abdominal lymph nodes: Secondary | ICD-10-CM | POA: Diagnosis present

## 2024-03-19 MED ORDER — METHOTREXATE SODIUM CHEMO INJECTION (PF) 50 MG/2ML
Freq: Once | INTRAMUSCULAR | Status: AC
  Filled 2024-03-19: qty 0.48

## 2024-03-19 MED ORDER — LIDOCAINE 1 % OPTIME INJ - NO CHARGE
5.0000 mL | Freq: Once | INTRAMUSCULAR | Status: AC
  Administered 2024-03-19: 5 mL via INTRADERMAL
  Filled 2024-03-19: qty 6

## 2024-03-19 NOTE — Discharge Instructions (Signed)
Lumbar Puncture, Care After Refer to this sheet in the next few weeks. These instructions provide you with information on caring for yourself after your procedure. Your health care provider may also give you more specific instructions. Your treatment has been planned according to current medical practices, but problems sometimes occur. Call your health care provider if you have any problems or questions after your procedure. What can I expect after the procedure? After your procedure, it is typical to have the following sensations: Mild discomfort or pain at the insertion site. Mild headache that is relieved with pain medicines.  Follow these instructions at home:  Avoid lifting anything heavier than 10 lb (4.5 kg) for at least 12 hours after the procedure. Drink enough fluids to keep your urine clear or pale yellow. Lay flat or as flat as possible for the remainder of the day. Contact a health care provider if: You have fever or chills. You have nausea or vomiting. You have a headache that lasts for more than 2 days. Get help right away if: You have any numbness or tingling in your legs. You are unable to control your bowel or bladder. You have bleeding or swelling in your back at the insertion site. You are dizzy or faint. This information is not intended to replace advice given to you by your health care provider. Make sure you discuss any questions you have with your health care provider. Document Released: 11/19/2013 Document Revised: 04/21/2016 Document Reviewed: 07/23/2013 Elsevier Interactive Patient Education  2017 Elsevier Inc. 

## 2024-03-19 NOTE — Progress Notes (Signed)
 Patient presents today for intrathecal chemotherapy administration. Patient had a severe post-LP headache with his first intrathecal chemotherapy administration. Please see note written 03/15/24 by Marilyn Shropshire, RN.   There was a request for today's procedure to remove "an equal amount of CSF" prior to chemotherapy administration. However, the patient and his daughter today state they do not want this. They just want the chemotherapy administered. Patient and daughter aware of lab orders for the CSF fluid. They state that Dr. Randy Buttery was only ordering the labs if the CSF fluid was to be removed.   Procedure was performed and labs were NOT sent per patient (and patient's daughter) request.   Jetta Morrow, AGACNP-BC 03/19/2024, 11:05 AM

## 2024-03-19 NOTE — Procedures (Signed)
 L4-L5 lumbar puncture with administration of intrathecal chemotherapy. Please see full dictation under the imaging tab in Epic.  Jetta Morrow, AGACNP-BC 03/19/2024, 11:06 AM

## 2024-03-20 ENCOUNTER — Inpatient Hospital Stay (HOSPITAL_BASED_OUTPATIENT_CLINIC_OR_DEPARTMENT_OTHER): Admitting: Oncology

## 2024-03-20 ENCOUNTER — Encounter: Payer: Self-pay | Admitting: Oncology

## 2024-03-20 ENCOUNTER — Inpatient Hospital Stay

## 2024-03-20 VITALS — BP 118/88 | HR 94 | Temp 96.8°F | Resp 18 | Ht 65.0 in | Wt 168.4 lb

## 2024-03-20 DIAGNOSIS — C8333 Diffuse large B-cell lymphoma, intra-abdominal lymph nodes: Secondary | ICD-10-CM

## 2024-03-20 DIAGNOSIS — R1319 Other dysphagia: Secondary | ICD-10-CM | POA: Diagnosis not present

## 2024-03-20 DIAGNOSIS — Z5112 Encounter for antineoplastic immunotherapy: Secondary | ICD-10-CM | POA: Diagnosis not present

## 2024-03-20 LAB — CMP (CANCER CENTER ONLY)
ALT: 25 U/L (ref 0–44)
AST: 19 U/L (ref 15–41)
Albumin: 4 g/dL (ref 3.5–5.0)
Alkaline Phosphatase: 90 U/L (ref 38–126)
Anion gap: 9 (ref 5–15)
BUN: 19 mg/dL (ref 8–23)
CO2: 25 mmol/L (ref 22–32)
Calcium: 8.8 mg/dL — ABNORMAL LOW (ref 8.9–10.3)
Chloride: 100 mmol/L (ref 98–111)
Creatinine: 0.78 mg/dL (ref 0.61–1.24)
GFR, Estimated: >60 mL/min (ref >60)
Glucose, Bld: 100 mg/dL — ABNORMAL HIGH (ref 70–99)
Potassium: 4.9 mmol/L (ref 3.5–5.1)
Sodium: 134 mmol/L — ABNORMAL LOW (ref 135–145)
Total Bilirubin: 0.7 mg/dL (ref 0.0–1.2)
Total Protein: 6.6 g/dL (ref 6.5–8.1)

## 2024-03-20 LAB — CBC WITH DIFFERENTIAL (CANCER CENTER ONLY)
Abs Immature Granulocytes: 0.4 10*3/uL — ABNORMAL HIGH (ref 0.00–0.07)
Basophils Absolute: 0 10*3/uL (ref 0.0–0.1)
Basophils Relative: 0 %
Eosinophils Absolute: 0.4 10*3/uL (ref 0.0–0.5)
Eosinophils Relative: 1 %
HCT: 35.1 % — ABNORMAL LOW (ref 39.0–52.0)
Hemoglobin: 11.7 g/dL — ABNORMAL LOW (ref 13.0–17.0)
Lymphocytes Relative: 4 %
Lymphs Abs: 1.7 10*3/uL (ref 0.7–4.0)
MCH: 29.7 pg (ref 26.0–34.0)
MCHC: 33.3 g/dL (ref 30.0–36.0)
MCV: 89.1 fL (ref 80.0–100.0)
Metamyelocytes Relative: 1 %
Monocytes Absolute: 0.4 10*3/uL (ref 0.1–1.0)
Monocytes Relative: 1 %
Neutro Abs: 39.7 10*3/uL — ABNORMAL HIGH (ref 1.7–7.7)
Neutrophils Relative %: 93 %
Platelet Count: 211 10*3/uL (ref 150–400)
RBC: 3.94 MIL/uL — ABNORMAL LOW (ref 4.22–5.81)
RDW: 17.4 % — ABNORMAL HIGH (ref 11.5–15.5)
Smear Review: NORMAL
WBC Count: 42.7 10*3/uL — ABNORMAL HIGH (ref 4.0–10.5)
nRBC: 0 % (ref 0.0–0.2)

## 2024-03-20 MED ORDER — PANTOPRAZOLE SODIUM 20 MG PO TBEC: 20.0000 mg | DELAYED_RELEASE_TABLET | Freq: Every day | ORAL | 2 refills | Status: DC

## 2024-03-20 NOTE — Progress Notes (Signed)
 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  He had a CT abdomen and pelvis with contrast which showed large soft tissue mass along the mesentery measuring up to 21 cm with encasement of central mesenteric vessels and loss of tissue planes between the mass and several bowel loops.  Caliber change along the third portion of duodenum with partial mild obstruction.  Findings concerning for lymphoma.  Serum creatinine mildly elevated at 1.4.  Spleen slightly enlarged without intrinsic splenic mass.  Patient denies any significant nausea or vomiting.  He has early satiety   PET CT scan from 12/22/2023 showed Hypermetabolic left supraclavicular mediastinal retroperitoneal nodes along with large central mesenteric nodal mass consistent with lymphoma.  SUV in these areas ranging around SUV 16.  Central mesenteric nodal mass extending into the upper pelvis.  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  Cells positive for CD20 with coexpression of CD10 and BCL6 consistent with germinal center/folic killer phenotype and aberrantly express Bcl-2 strongly.  MOM1 and CD30 are negative.  EBV ISH negative.  Proliferation  ranges from 20 to 80% with overall average of 60 to 70%.  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       Patient received Pola R CHP chemotherapy for cycle 1.  Double hit testing showed make gene rearrangement at 75%. Although Bcl-2 and BCL6 rearrangement was detected on FISH these were not known partners to make and therefore it was reported as a pseudo double hit.  However given the fact that this is a transformed diffuse large B-cell lymphoma from a pre-existing follicular lymphoma this would fit the pattern of a double hit lymphoma/ high grade B cell lymphoma.  Plan was therefore made to switch him to dose adjusted R-EPOCH chemotherapy for the remainder of the cycles   Initially plan was not to offer CNS prophylaxis however given that his double hit lymphoma with an intermediate risk score of 3 intrathecal methotrexate  will be given if tolerated for 4 cycles    Interval history-patient states that his lumbar puncture and intrathecal methotrexate  injection was an uncomfortable experience yesterday.  He felt that during the procedure they hit a nerve and he was in pain during the procedure.  He does not report any headaches today.  He does report occasional difficulty swallowing food and feels like food is getting stuck as it goes down especially when he eats more quantity and 1 go.  ECOG PS- 0 Pain scale- 0   Review of systems- Review of Systems  Constitutional:  Negative for chills, fever, malaise/fatigue and weight loss.  HENT:  Negative for congestion, ear discharge and nosebleeds.   Eyes:  Negative for blurred vision.  Respiratory:  Negative for cough, hemoptysis, sputum production, shortness of breath and wheezing.   Cardiovascular:  Negative for chest pain, palpitations, orthopnea and claudication.   Gastrointestinal:  Negative for abdominal pain, blood in stool, constipation, diarrhea, heartburn, melena, nausea and vomiting.  Genitourinary:  Negative for dysuria, flank pain, frequency, hematuria and urgency.  Musculoskeletal:  Negative for back pain, joint pain and myalgias.  Skin:  Negative for rash.  Neurological:  Negative for dizziness, tingling, focal weakness, seizures, weakness and headaches.  Endo/Heme/Allergies:  Does not bruise/bleed easily.  Psychiatric/Behavioral:  Negative for depression and suicidal ideas. The patient does not have insomnia.       No Known Allergies   History reviewed. No pertinent past medical history.   Past Surgical History:  Procedure Laterality Date   FISTULOTOMY N/A 01/10/2018   Procedure: FISTULOTOMY;  Surgeon: Eldred Grego, MD;  Location: [facility] ORS;  Service: General;  Laterality: N/A;   MASS BIOPSY Left 12/28/2023   Procedure: NECK MASS BIOPSY;  Surgeon: Alben Alma, MD;  Location: [facility] ORS;  Service: General;  Laterality: Left;   PORTACATH PLACEMENT N/A 12/28/2023   Procedure: INSERTION PORT-A-CATH;  Surgeon: Alben Alma, MD;  Location: [facility] ORS;  Service: General;  Laterality: N/A;    Social History   Socioeconomic History   Marital status: Single    Spouse name: Not on file   Number of children: 4   Years of education: Not on file   Highest education level: Not on file  Occupational History   Not on file  Tobacco Use   Smoking status: Former    Types: Cigarettes    Passive exposure: Past   Smokeless tobacco: Never  Vaping Use   Vaping status: Never Used  Substance and Sexual Activity   Alcohol use: Not Currently   Drug use: Not Currently    Types: Cocaine    Comment: years ago    Sexual activity: Yes  Other Topics Concern   Not on file  Social History Narrative   Not on file   Social Drivers of Health   Financial Resource Strain: Not on file  Food Insecurity: No Food Insecurity (12/27/2023)    Hunger Vital Sign    Worried About Running Out of Food in the Last Year: Never true    Ran Out of Food in the Last Year: Never true  Transportation Needs: No Transportation Needs (12/27/2023)   PRAPARE - Administrator, Civil Service (Medical): No    Lack of Transportation (Non-Medical): No  Physical Activity: Inactive (12/27/2023)   Exercise Vital Sign    Days of Exercise per Week: 0 days    Minutes of Exercise per Session: 0 min  Stress: Not on file  Social Connections: Not on file  Intimate Partner Violence: Not At Risk (12/27/2023)   Humiliation, Afraid, Rape, and Kick questionnaire    Fear of Current or Ex-Partner: No    Emotionally Abused: No    Physically Abused: No    Sexually Abused: No    Family History  Problem Relation Age of Onset   Colon cancer Mother    Pancreatic cancer Father    Cancer Sister      Current Outpatient Medications:    pantoprazole  (PROTONIX ) 20 MG tablet, Take 1 tablet (20 mg total) by mouth daily. Take 1 tablet (20 mg total) by mouth every morning., Disp: 30 tablet,  Rfl: 2   acyclovir  (ZOVIRAX ) 400 MG tablet, Take 400 mg by mouth daily., Disp: , Rfl:    HYDROcodone -acetaminophen  (NORCO/VICODIN) 5-325 MG tablet, Take 1 tablet by mouth every 6 (six) hours as needed for moderate pain (pain score 4-6). (Patient not taking: Reported on 03/01/2024), Disp: 15 tablet, Rfl: 0   predniSONE  (DELTASONE ) 20 MG tablet, Take 5 tablets (100 mg total) by mouth daily with breakfast. (Patient not taking: Reported on 03/01/2024), Disp: 35 tablet, Rfl: 2   predniSONE  (DELTASONE ) 20 MG tablet, Take 1 tablet (20 mg total) by mouth daily with breakfast., Disp: 2 tablet, Rfl: 0   sulfamethoxazole -trimethoprim  (BACTRIM  DS) 800-160 MG tablet, Take 1 tablet by mouth 2 (two) times daily., Disp: , Rfl:  No current facility-administered medications for this visit.  Facility-Administered Medications Ordered in Other Visits:    0.9 %  sodium chloride  infusion, , Intravenous,  Continuous, Avonne Boettcher, MD, Stopped at 01/29/24 1143   0.9 %  sodium chloride  infusion, , Intravenous, Continuous, Avonne Boettcher, MD, Stopped at 03/11/24 1228   methotrexate  (PF) 12 mg, hydrocortisone  sodium succinate (SOLU-CORTEF ) 50 mg in sodium chloride  (PF) 0.9 % INTRATHECAL chemo injection, , Intrathecal, Once, Avonne Boettcher, MD   sodium chloride  flush (NS) 0.9 % injection 10 mL, 10 mL, Intracatheter, PRN, Avonne Boettcher, MD, 10 mL at 01/30/24 1254  Physical exam:  Vitals:   03/20/24 0920  BP: 118/88  Pulse: 94  Resp: 18  Temp: (!) 96.8 F (36 C)  TempSrc: Tympanic  SpO2: 100%  Weight: 168 lb 6.4 oz (76.4 kg)  Height: 5\' 5"  (1.651 m)   Physical Exam   I have personally reviewed labs listed below:    Latest Ref Rng & Units 03/20/2024    9:55 AM  CMP  Glucose 70 - 99 mg/dL 811   BUN 8 - 23 mg/dL 19   Creatinine 9.14 - 1.24 mg/dL 7.82   Sodium 956 - 213 mmol/L 134   Potassium 3.5 - 5.1 mmol/L 4.9   Chloride 98 - 111 mmol/L 100   CO2 22 - 32 mmol/L 25   Calcium 8.9 - 10.3 mg/dL 8.8   Total Protein 6.5 - 8.1 g/dL 6.6   Total Bilirubin 0.0 - 1.2 mg/dL 0.7   Alkaline Phos 38 - 126 U/L 90   AST 15 - 41 U/L 19   ALT 0 - 44 U/L 25       Latest Ref Rng & Units 03/20/2024    9:55 AM  CBC  WBC 4.0 - 10.5 K/uL 42.7   Hemoglobin 13.0 - 17.0 g/dL 08.6   Hematocrit 57.8 - 52.0 % 35.1   Platelets 150 - 400 K/uL 211    I have personally reviewed Radiology images listed below: No images are attached to the encounter.  DG FL LUMBAR PUNCTURE W/CHEMO INJECT Result Date: 03/19/2024 CLINICAL DATA:  Patient with a history of diffuse large B-cell lymphoma presents today for intrathecal injection of methotrexate  and hydrocortisone . EXAM: FLUOROSCOPICALLY GUIDED LUMBAR PUNCTURE FOR INTRATHECAL CHEMOTHERAPY FLUOROSCOPY TIME:  Radiation exposure index as provided by the fluoroscopic device 4.90 mGy PROCEDURE: Informed consent was obtained from the patient prior to the procedure,  including potential complications or reactions to the medication, seizures, bleeding, infection, paresthesias, nerve damage, CSF leak requiring additional procedures, post procedure requirement to lay flat for several hours after the procedure, headache, allergy, and pain. With the patient prone, the lower back was prepped with Betadine. 1% Lidocaine  was used for local anesthesia.  Lumbar puncture was performed at the L4-L5 level using a 20 gauge needle with return of clear, colorless CSF. A prefilled 10 mL sterile syringe from the pharmacy containing 12 mg of methotrexate  with 50 mg of hydrocortisone  sodium succinate was injected into the subarachnoid space. The inner stylet was placed back in the needle and the needle was removed. The patient tolerated the procedure well without apparent complication. IMPRESSION: Technically successful fluoroscopic guided lumbar puncture with intrathecal injection of chemotherapy. Procedure performed by Jetta Morrow NP and was supervised by Dr. Elene Griffes Electronically Signed   By: Elene Griffes M.D.   On: 03/19/2024 12:45   NM PET Image Restag (PS) Skull Base To Thigh Result Date: 03/02/2024 CLINICAL DATA:  Subsequent treatment strategy for large B-cell lymphoma. EXAM: NUCLEAR MEDICINE PET SKULL BASE TO THIGH TECHNIQUE: 9.37 mCi F-18 FDG was injected intravenously. Full-ring PET imaging was performed from the skull base to thigh after the radiotracer. CT data was obtained and used for attenuation correction and anatomic localization. Fasting blood glucose: 97 mg/dl COMPARISON:  Prior PET-CT 12/22/2023 FINDINGS: Mediastinal blood pool activity: SUV max 1.96 Liver activity: SUV max 3.27 NECK: Minimal residual matted soft tissue density in the left supraclavicular area along with small surgical clips suggesting prior surgical excisional biopsy. No residual measurable disease or hypermetabolism (Deauville 1). Incidental CT findings: None. CHEST: No hypermetabolic mediastinal or  hilar nodes. No suspicious pulmonary nodules on the CT scan. Incidental CT findings: The left-sided Port-A-Cath is in good position. ABDOMEN/PELVIS: The extensive/bulky mesenteric and retroperitoneal lymphadenopathy seen on the prior PET-CT has largely resolved. There is a matted masslike area of residual soft tissue density in the root of the small bowel mesentery measuring approximately 9.4 x 4.0 cm on image 103/6. Residual low level FDG uptake with SUV max of 2.55 and previously 13.3 (Deauville 3). Ill-defined matted soft tissue density in the retro per also consistent with treated tumor. SUV max is 2.54 and was previously 17.84 (Deauville 3). No residual measurable pelvic adenopathy or hypermetabolism (Deauville 1). No splenomegaly or splenic hypermetabolic lesions. Incidental CT findings: Stable scattered vascular calcifications. Abdominal/pelvic ascites seen on the prior PET-CT has resolved. SKELETON: Mild diffuse marrow hypermetabolism likely treatment related. Incidental CT findings: None. IMPRESSION: 1. PET-CT findings suggest a good response to treatment as detailed above. 2. No residual measurable or hypermetabolic left supraclavicular or pelvic adenopathy (Deauville 1). 3. Matted/treated soft tissue density in the mesentery and retroperitoneum with very low level FDG uptake consistent with treated disease (Deauville 3). 4. No splenomegaly or splenic hypermetabolic lesions. 5. Mild diffuse marrow hypermetabolism likely treatment related. Electronically Signed   By: Marrian Siva M.D.   On: 03/02/2024 11:07   DG FL LUMBAR PUNCTURE W/CHEMO INJECT Result Date: 02/27/2024 CLINICAL DATA:  Patient with diffuse large B-cell lymphoma who is here today for intrathecal injection of methotrexate  and hydrocortisone  per his oncology provider. EXAM: FLUOROSCOPICALLY GUIDED LUMBAR PUNCTURE FOR INTRATHECAL CHEMOTHERAPY COMPARISON:  None. FLUOROSCOPY TIME:  Radiation Exposure Index (if provided by the fluoroscopic  device): 18 seconds, 1.9 mGy PROCEDURE: Informed consent was obtained from the patient prior to the procedure, including potential complications or reactions to the medication, seizures, bleeding, infection, paresthesias, nerve damage, CSF leak requiring additional procedures, post procedure requirement to lay flat for several hours after the procedure, headache, allergy, and pain. With the patient prone, the lower back was prepped with Betadine. 1% lidocaine  was used for local anesthesia. Lumbar puncture was performed at the L4-L5 level using a 20 gauge needle  with return of colorless CSF with an opening pressure of 14 cm water. A pre-filled 10 mL sterile syringe from the pharmacy containing 12 mg of methotrexate  with 50 mg of hydrocortisone  sodium succinate was injected into the subarachnoid space. The inner stylet was placed back into the needle and the needle was removed. The patient tolerated the procedure well and there were no apparent complications. A sterile bandage was applied. IMPRESSION: Successful fluoroscopic guided lumbar puncture and intrathecal injection of chemotherapy, without complication. This exam was performed by Sherline Distel PA-C, and was supervised and interpreted by Dr. Ethelle Herb. Electronically Signed   By: Denny Flack M.D.   On: 02/27/2024 12:28     Assessment and plan- Patient is a 63 y.o. 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  He is s/p post 1 cycle of Pola R CHP chemotherapy and was subsequently switched to dose adjusted R-EPOCH chemotherapy.  He is here for ongoing evaluation of odynophagia  Patient reports difficulty swallowing his food but denies any pain during swallowing.  He feels like food is getting stuck in his lower end of the esophagus when he eats more than usual.  Given that he has been on chemotherapy as well as high doses of steroids there may be a component of reflux and I am starting him on Protonix  20 mg daily.   Esophageal candidiasis is also in the differential.  If symptoms persist despite starting Protonix  we will consider referral to GI for ruling out esophageal candidiasis on EGD.  Patient also did not have a comfortable experience during his intrathecal methotrexate  injection and does not wish to pursue any further intrathecal therapy at this time.  I will see him back in 2 weeks for cycle 5 of chemotherapy  He will be coming twice a week this week and next week to get his CBC with differential checked   Visit Diagnosis 1. Other dysphagia   2. Diffuse large B-cell lymphoma of intra-abdominal lymph nodes (HCC)      Dr. Seretha Dance, MD, MPH [facility] [phone] 03/20/2024 12:13 PM

## 2024-03-22 ENCOUNTER — Inpatient Hospital Stay

## 2024-03-22 DIAGNOSIS — Z5112 Encounter for antineoplastic immunotherapy: Secondary | ICD-10-CM | POA: Diagnosis not present

## 2024-03-22 DIAGNOSIS — C8333 Diffuse large B-cell lymphoma, intra-abdominal lymph nodes: Secondary | ICD-10-CM

## 2024-03-22 LAB — CBC WITH DIFFERENTIAL (CANCER CENTER ONLY)
Abs Immature Granulocytes: 0.33 10*3/uL — ABNORMAL HIGH (ref 0.00–0.07)
Basophils Absolute: 0 10*3/uL (ref 0.0–0.1)
Basophils Relative: 1 %
Eosinophils Absolute: 0.1 10*3/uL (ref 0.0–0.5)
Eosinophils Relative: 1 %
HCT: 31.2 % — ABNORMAL LOW (ref 39.0–52.0)
Hemoglobin: 10.6 g/dL — ABNORMAL LOW (ref 13.0–17.0)
Immature Granulocytes: 7 %
Lymphocytes Relative: 15 %
Lymphs Abs: 0.7 10*3/uL (ref 0.7–4.0)
MCH: 29.9 pg (ref 26.0–34.0)
MCHC: 34 g/dL (ref 30.0–36.0)
MCV: 88.1 fL (ref 80.0–100.0)
Monocytes Absolute: 0.1 10*3/uL (ref 0.1–1.0)
Monocytes Relative: 3 %
Neutro Abs: 3.2 10*3/uL (ref 1.7–7.7)
Neutrophils Relative %: 73 %
Platelet Count: 124 10*3/uL — ABNORMAL LOW (ref 150–400)
RBC: 3.54 MIL/uL — ABNORMAL LOW (ref 4.22–5.81)
RDW: 16.9 % — ABNORMAL HIGH (ref 11.5–15.5)
Smear Review: NORMAL
WBC Count: 4.5 10*3/uL (ref 4.0–10.5)
nRBC: 0 % (ref 0.0–0.2)

## 2024-03-25 ENCOUNTER — Telehealth: Payer: Self-pay

## 2024-03-25 ENCOUNTER — Encounter: Payer: Self-pay | Admitting: Oncology

## 2024-03-25 NOTE — Telephone Encounter (Signed)
 ADA Medical Certification paperwork received via patient's MyChart message.  Form is completed and pending Dr. Randy Buttery signature.

## 2024-03-26 ENCOUNTER — Other Ambulatory Visit: Payer: Self-pay | Admitting: Oncology

## 2024-03-26 ENCOUNTER — Inpatient Hospital Stay

## 2024-03-26 ENCOUNTER — Encounter: Payer: Self-pay | Admitting: Oncology

## 2024-03-26 DIAGNOSIS — C8333 Diffuse large B-cell lymphoma, intra-abdominal lymph nodes: Secondary | ICD-10-CM

## 2024-03-26 DIAGNOSIS — C8213 Follicular lymphoma grade II, intra-abdominal lymph nodes: Secondary | ICD-10-CM

## 2024-03-26 DIAGNOSIS — Z5112 Encounter for antineoplastic immunotherapy: Secondary | ICD-10-CM | POA: Diagnosis not present

## 2024-03-26 LAB — CBC WITH DIFFERENTIAL (CANCER CENTER ONLY)
Abs Immature Granulocytes: 1.04 10*3/uL — ABNORMAL HIGH (ref 0.00–0.07)
Basophils Absolute: 0.1 10*3/uL (ref 0.0–0.1)
Basophils Relative: 2 %
Eosinophils Absolute: 0.1 10*3/uL (ref 0.0–0.5)
Eosinophils Relative: 1 %
HCT: 34.2 % — ABNORMAL LOW (ref 39.0–52.0)
Hemoglobin: 11 g/dL — ABNORMAL LOW (ref 13.0–17.0)
Immature Granulocytes: 11 %
Lymphocytes Relative: 11 %
Lymphs Abs: 1 10*3/uL (ref 0.7–4.0)
MCH: 29.5 pg (ref 26.0–34.0)
MCHC: 32.2 g/dL (ref 30.0–36.0)
MCV: 91.7 fL (ref 80.0–100.0)
Monocytes Absolute: 0.8 10*3/uL (ref 0.1–1.0)
Monocytes Relative: 8 %
Neutro Abs: 6.2 10*3/uL (ref 1.7–7.7)
Neutrophils Relative %: 67 %
Platelet Count: 123 10*3/uL — ABNORMAL LOW (ref 150–400)
RBC: 3.73 MIL/uL — ABNORMAL LOW (ref 4.22–5.81)
RDW: 18.3 % — ABNORMAL HIGH (ref 11.5–15.5)
Smear Review: NORMAL
WBC Count: 9.3 10*3/uL (ref 4.0–10.5)
nRBC: 0.8 % — ABNORMAL HIGH (ref 0.0–0.2)

## 2024-03-26 NOTE — Telephone Encounter (Signed)
 Completed paperwork given to patient at his lab appt today (03/26/24)

## 2024-03-27 ENCOUNTER — Encounter: Payer: Self-pay | Admitting: Oncology

## 2024-03-28 ENCOUNTER — Telehealth: Payer: Self-pay

## 2024-03-28 NOTE — Telephone Encounter (Signed)
No need for refill

## 2024-03-28 NOTE — Telephone Encounter (Signed)
 Outbound call to check the status of Clonoseq specimen for patient.  Spoke to [name] at [facility]; states they have requested a lymph node tissue biopsy from 12/28/2023 for the ID test.  Notations indicate they spoke w/ Sammi at the lab yesterday and the estimated material would be shipped out in 48 hours.  The lab houses their material at an offsite location and this may also cause a delay.  Mathis Som estimated labs to result by 04/17/24 since the ID test takes 2 weeks to result and the MRT is another couple of days to result after that.

## 2024-03-29 ENCOUNTER — Inpatient Hospital Stay: Attending: Oncology

## 2024-03-29 DIAGNOSIS — Z5189 Encounter for other specified aftercare: Secondary | ICD-10-CM | POA: Insufficient documentation

## 2024-03-29 DIAGNOSIS — Z79634 Long term (current) use of topoisomerase inhibitor: Secondary | ICD-10-CM | POA: Insufficient documentation

## 2024-03-29 DIAGNOSIS — Z79632 Long term (current) use of antitumor antibiotic: Secondary | ICD-10-CM | POA: Diagnosis not present

## 2024-03-29 DIAGNOSIS — Z79899 Other long term (current) drug therapy: Secondary | ICD-10-CM | POA: Insufficient documentation

## 2024-03-29 DIAGNOSIS — R1310 Dysphagia, unspecified: Secondary | ICD-10-CM | POA: Insufficient documentation

## 2024-03-29 DIAGNOSIS — Z800 Family history of malignant neoplasm of digestive organs: Secondary | ICD-10-CM | POA: Insufficient documentation

## 2024-03-29 DIAGNOSIS — Z79630 Long term (current) use of alkylating agent: Secondary | ICD-10-CM | POA: Diagnosis not present

## 2024-03-29 DIAGNOSIS — Z5111 Encounter for antineoplastic chemotherapy: Secondary | ICD-10-CM | POA: Insufficient documentation

## 2024-03-29 DIAGNOSIS — Z79620 Long term (current) use of immunosuppressive biologic: Secondary | ICD-10-CM | POA: Diagnosis not present

## 2024-03-29 DIAGNOSIS — Z79624 Long term (current) use of inhibitors of nucleotide synthesis: Secondary | ICD-10-CM | POA: Diagnosis not present

## 2024-03-29 DIAGNOSIS — C8333 Diffuse large B-cell lymphoma, intra-abdominal lymph nodes: Secondary | ICD-10-CM | POA: Diagnosis present

## 2024-03-29 DIAGNOSIS — Z79633 Long term (current) use of mitotic inhibitor: Secondary | ICD-10-CM | POA: Insufficient documentation

## 2024-03-29 DIAGNOSIS — R7989 Other specified abnormal findings of blood chemistry: Secondary | ICD-10-CM | POA: Insufficient documentation

## 2024-03-29 DIAGNOSIS — Z5112 Encounter for antineoplastic immunotherapy: Secondary | ICD-10-CM | POA: Diagnosis present

## 2024-03-29 DIAGNOSIS — R6881 Early satiety: Secondary | ICD-10-CM | POA: Diagnosis not present

## 2024-03-29 DIAGNOSIS — R519 Headache, unspecified: Secondary | ICD-10-CM | POA: Insufficient documentation

## 2024-03-29 DIAGNOSIS — Z87891 Personal history of nicotine dependence: Secondary | ICD-10-CM | POA: Diagnosis not present

## 2024-03-29 DIAGNOSIS — Z809 Family history of malignant neoplasm, unspecified: Secondary | ICD-10-CM | POA: Insufficient documentation

## 2024-03-29 LAB — CBC WITH DIFFERENTIAL (CANCER CENTER ONLY)
Abs Immature Granulocytes: 2.89 10*3/uL — ABNORMAL HIGH (ref 0.00–0.07)
Basophils Absolute: 0.1 10*3/uL (ref 0.0–0.1)
Basophils Relative: 1 %
Eosinophils Absolute: 0.1 10*3/uL (ref 0.0–0.5)
Eosinophils Relative: 1 %
HCT: 34.1 % — ABNORMAL LOW (ref 39.0–52.0)
Hemoglobin: 11.3 g/dL — ABNORMAL LOW (ref 13.0–17.0)
Immature Granulocytes: 20 %
Lymphocytes Relative: 8 %
Lymphs Abs: 1.3 10*3/uL (ref 0.7–4.0)
MCH: 30.1 pg (ref 26.0–34.0)
MCHC: 33.1 g/dL (ref 30.0–36.0)
MCV: 90.9 fL (ref 80.0–100.0)
Monocytes Absolute: 0.9 10*3/uL (ref 0.1–1.0)
Monocytes Relative: 6 %
Neutro Abs: 9.6 10*3/uL — ABNORMAL HIGH (ref 1.7–7.7)
Neutrophils Relative %: 64 %
Platelet Count: 163 10*3/uL (ref 150–400)
RBC: 3.75 MIL/uL — ABNORMAL LOW (ref 4.22–5.81)
RDW: 17.9 % — ABNORMAL HIGH (ref 11.5–15.5)
Smear Review: NORMAL
WBC Count: 14.8 10*3/uL — ABNORMAL HIGH (ref 4.0–10.5)
nRBC: 0.3 % — ABNORMAL HIGH (ref 0.0–0.2)

## 2024-03-30 ENCOUNTER — Other Ambulatory Visit: Payer: Self-pay | Admitting: Oncology

## 2024-04-01 ENCOUNTER — Inpatient Hospital Stay

## 2024-04-01 ENCOUNTER — Inpatient Hospital Stay (HOSPITAL_BASED_OUTPATIENT_CLINIC_OR_DEPARTMENT_OTHER): Admitting: Oncology

## 2024-04-01 ENCOUNTER — Encounter: Payer: Self-pay | Admitting: Oncology

## 2024-04-01 VITALS — BP 121/84 | HR 73 | Temp 97.6°F | Resp 20 | Wt 170.5 lb

## 2024-04-01 VITALS — BP 120/72 | HR 82 | Temp 97.0°F | Resp 16

## 2024-04-01 DIAGNOSIS — Z5111 Encounter for antineoplastic chemotherapy: Secondary | ICD-10-CM

## 2024-04-01 DIAGNOSIS — Z79620 Long term (current) use of immunosuppressive biologic: Secondary | ICD-10-CM

## 2024-04-01 DIAGNOSIS — C8333 Diffuse large B-cell lymphoma, intra-abdominal lymph nodes: Secondary | ICD-10-CM

## 2024-04-01 DIAGNOSIS — Z5181 Encounter for therapeutic drug level monitoring: Secondary | ICD-10-CM | POA: Diagnosis not present

## 2024-04-01 DIAGNOSIS — G971 Other reaction to spinal and lumbar puncture: Secondary | ICD-10-CM | POA: Diagnosis not present

## 2024-04-01 DIAGNOSIS — Z5112 Encounter for antineoplastic immunotherapy: Secondary | ICD-10-CM | POA: Diagnosis not present

## 2024-04-01 LAB — CBC WITH DIFFERENTIAL/PLATELET
Abs Immature Granulocytes: 3.07 10*3/uL — ABNORMAL HIGH (ref 0.00–0.07)
Basophils Absolute: 0.2 10*3/uL — ABNORMAL HIGH (ref 0.0–0.1)
Basophils Relative: 1 %
Eosinophils Absolute: 0.1 10*3/uL (ref 0.0–0.5)
Eosinophils Relative: 1 %
HCT: 34.4 % — ABNORMAL LOW (ref 39.0–52.0)
Hemoglobin: 11.4 g/dL — ABNORMAL LOW (ref 13.0–17.0)
Immature Granulocytes: 16 %
Lymphocytes Relative: 8 %
Lymphs Abs: 1.5 10*3/uL (ref 0.7–4.0)
MCH: 30.2 pg (ref 26.0–34.0)
MCHC: 33.1 g/dL (ref 30.0–36.0)
MCV: 91 fL (ref 80.0–100.0)
Monocytes Absolute: 1.2 10*3/uL — ABNORMAL HIGH (ref 0.1–1.0)
Monocytes Relative: 7 %
Neutro Abs: 12.7 10*3/uL — ABNORMAL HIGH (ref 1.7–7.7)
Neutrophils Relative %: 67 %
Platelets: 212 10*3/uL (ref 150–400)
RBC: 3.78 MIL/uL — ABNORMAL LOW (ref 4.22–5.81)
RDW: 17.4 % — ABNORMAL HIGH (ref 11.5–15.5)
Smear Review: NORMAL
WBC: 18.9 10*3/uL — ABNORMAL HIGH (ref 4.0–10.5)
nRBC: 0.3 % — ABNORMAL HIGH (ref 0.0–0.2)

## 2024-04-01 LAB — CMP (CANCER CENTER ONLY)
ALT: 22 U/L (ref 0–44)
AST: 26 U/L (ref 15–41)
Albumin: 3.9 g/dL (ref 3.5–5.0)
Alkaline Phosphatase: 82 U/L (ref 38–126)
Anion gap: 10 (ref 5–15)
BUN: 10 mg/dL (ref 8–23)
CO2: 21 mmol/L — ABNORMAL LOW (ref 22–32)
Calcium: 8.3 mg/dL — ABNORMAL LOW (ref 8.9–10.3)
Chloride: 103 mmol/L (ref 98–111)
Creatinine: 0.97 mg/dL (ref 0.61–1.24)
GFR, Estimated: >60 mL/min (ref >60)
Glucose, Bld: 127 mg/dL — ABNORMAL HIGH (ref 70–99)
Potassium: 4 mmol/L (ref 3.5–5.1)
Sodium: 134 mmol/L — ABNORMAL LOW (ref 135–145)
Total Bilirubin: 0.4 mg/dL (ref 0.0–1.2)
Total Protein: 6.5 g/dL (ref 6.5–8.1)

## 2024-04-01 MED ORDER — SODIUM CHLORIDE 0.9 % IV SOLN
INTRAVENOUS | Status: AC
  Filled 2024-04-01: qty 250

## 2024-04-01 MED ORDER — DIPHENHYDRAMINE HCL 25 MG PO CAPS
50.0000 mg | ORAL_CAPSULE | Freq: Once | ORAL | Status: AC
  Administered 2024-04-01: 50 mg via ORAL
  Filled 2024-04-01: qty 2

## 2024-04-01 MED ORDER — PREDNISONE 20 MG PO TABS
100.0000 mg | ORAL_TABLET | Freq: Every day | ORAL | 1 refills | Status: AC
Start: 2024-04-01 — End: 2024-04-06

## 2024-04-01 MED ORDER — SODIUM CHLORIDE 0.9 % IV SOLN
375.0000 mg/m2 | Freq: Once | INTRAVENOUS | Status: AC
  Administered 2024-04-01: 700 mg via INTRAVENOUS
  Filled 2024-04-01: qty 20

## 2024-04-01 MED ORDER — PALONOSETRON HCL INJECTION 0.25 MG/5ML
0.2500 mg | Freq: Once | INTRAVENOUS | Status: AC
  Administered 2024-04-01: 0.25 mg via INTRAVENOUS
  Filled 2024-04-01: qty 5

## 2024-04-01 MED ORDER — VINCRISTINE SULFATE CHEMO INJECTION 1 MG/ML
Freq: Once | INTRAVENOUS | Status: AC
  Filled 2024-04-01: qty 11

## 2024-04-01 MED ORDER — ACETAMINOPHEN 325 MG PO TABS
650.0000 mg | ORAL_TABLET | Freq: Once | ORAL | Status: AC
  Administered 2024-04-01: 650 mg via ORAL
  Filled 2024-04-01: qty 2

## 2024-04-01 NOTE — Patient Instructions (Signed)
 Instrucciones al darle de alta: Discharge Instructions Gracias por elegir al [facility] de Cncer de [place] para brindarle atencin mdica de oncologa y Teacher, English as a foreign language.   Si usted tiene una cita de laboratorio con [employer] de [place], por favor vaya directamente al [employer] de Cncer y regstrese en el rea de Engineer, maintenance (IT).   Use ropa cmoda y [place] para tener fcil acceso a las vas del Portacath (acceso venoso de Set designer duracin) o la lnea PICC (catter central colocado por va perifrica).   Nos esforzamos por ofrecerle tiempo de calidad con su proveedor. Es posible que tenga que volver a programar su cita si llega tarde (15 minutos o ms).  El llegar tarde le afecta a usted y a otros pacientes cuyas citas son posteriores a Armed forces operational officer.  Adems, si usted falta a tres o ms citas sin avisar a la oficina, puede ser retirado(a) de la clnica a discrecin del proveedor.      Para las solicitudes de renovacin de recetas, pida a su farmacia que se ponga en contacto con nuestra oficina y deje que transcurran 72 horas para que se complete el proceso de las renovaciones.     Para ayudar a prevenir las nuseas y los vmitos despus de su tratamiento, le recomendamos que tome su medicamento para las nuseas segn las indicaciones.  LOS SNTOMAS QUE DEBEN COMUNICARSE INMEDIATAMENTE SE INDICAN A CONTINUACIN: *FIEBRE SUPERIOR A 100.4 F (38 C) O MS *ESCALOFROS O SUDORACIN *NUSEAS Y VMITOS QUE NO SE CONTROLAN CON EL MEDICAMENTO PARA LAS NUSEAS *DIFICULTAD INUSUAL PARA RESPIRAR  *MORETONES O HEMORRAGIAS NO HABITUALES *PROBLEMAS URINARIOS (dolor o ardor al Geographical information systems officer o frecuencia para Geographical information systems officer) *PROBLEMAS INTESTINALES (diarrea inusual, estreimiento, dolor cerca del ano) SENSIBILIDAD EN LA BOCA Y EN LA GARGANTA CON O SIN LA PRESENCIA DE LCERAS (dolor de garganta, llagas en la boca o dolor de muelas/dientes) ERUPCIN, HINCHAZN O DOLORES INUSUALES FLUJO VAGINAL INUSUAL O PICAZN/RASQUIA    Los puntos marcados  con un asterisco ( *) indican una posible emergencia y debe hacer un seguimiento tan pronto como le sea posible o vaya al Departamento de Emergencias si se le presenta algn problema.  Por favor, muestre la [place] DE ADVERTENCIA DE Marc Morgans DE ADVERTENCIA DE Gard[place] Fanti al registrarse en [address] de Emergencias y a la enfermera de triaje.  Si tiene preguntas despus de su visita o necesita cancelar o volver a programar su cita, por favor pngase en contacto con [facility] - A DEPT OF [facility]igha Bridegroom [facility]  [phone]  y siga las instrucciones. Las horas de oficina son de 8:00 a.m. a 4:30 p.m. de lunes a viernes. Por favor, tenga en cuenta que los mensajes de voz que se dejan despus de las 4:00 p.m. posiblemente no se devolvern hasta el siguiente da de [place].  Cerramos los fines de semana y [employer]. En todo momento tiene acceso a una enfermera para preguntas urgentes. Por favor, llame al nmero principal de la clnica  [phone] y siga las instrucciones.   Para cualquier pregunta que no sea de carcter urgente, tambin puede ponerse en contacto con su proveedor [employer]. Ahora ofrecemos visitas electrnicas para cualquier persona mayor de 18 aos que solicite atencin mdica en lnea para los sntomas que no sean urgentes. Para ms detalles vaya a [email or web address].   Tambin puede bajar la aplicacin de MyChart! Vaya a la tienda de aplicaciones, busque "MyChart", abra la aplicacin, seleccione , e ingrese con su  nombre de usuario y la contrasea de Clinical cytogeneticist.

## 2024-04-01 NOTE — Progress Notes (Signed)
 Outbound call to [facility] in [place], spoke to Buckley who indicated Prednisone  received 03/15/24 was picked up but the script was for a quantity dispensed of 2 tablets.  Will put in a new script.

## 2024-04-01 NOTE — Progress Notes (Signed)
 Patient states he was supposed to take the steroids (prednisone ) today but when he went to pick prescription up from pharmacy over the weekend they had no refills. So he was not able to take prescription.

## 2024-04-01 NOTE — Progress Notes (Signed)
 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  He had a CT abdomen and pelvis with contrast which showed large soft tissue mass along the mesentery measuring up to 21 cm with encasement of central mesenteric vessels and loss of tissue planes between the mass and several bowel loops.  Caliber change along the third portion of duodenum with partial mild obstruction.  Findings concerning for lymphoma.  Serum creatinine mildly elevated at 1.4.  Spleen slightly enlarged without intrinsic splenic mass.  Patient denies any significant nausea or vomiting.  He has early satiety   PET CT scan from 12/22/2023 showed Hypermetabolic left supraclavicular mediastinal retroperitoneal nodes along with large central mesenteric nodal mass consistent with lymphoma.  SUV in these areas ranging around SUV 16.  Central mesenteric nodal mass extending into the upper pelvis.  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  Cells positive for CD20 with coexpression of CD10 and BCL6 consistent with germinal center/folic killer phenotype and aberrantly express  Bcl-2 strongly.  MOM1 and CD30 are negative.  EBV ISH negative.  Proliferation ranges from 20 to 80% with overall average of 60 to 70%.  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       Patient received Pola R CHP chemotherapy for cycle 1.  Double hit testing showed make gene rearrangement at 75%. Although Bcl-2 and BCL6 rearrangement was detected on FISH these were not known partners to make and therefore it was reported as a pseudo double hit.  However given the fact that this is a transformed diffuse large B-cell lymphoma from a pre-existing follicular lymphoma this would fit the pattern of a double hit lymphoma/ high grade B cell lymphoma.  Plan was therefore made to switch him to dose adjusted R-EPOCH chemotherapy for the remainder of the cycles   Initially plan was not to offer CNS prophylaxis however given that his double hit lymphoma with an intermediate risk score of 3 intrathecal methotrexate  will be given if tolerated for 4 cycles  Interval history-patient had pain following lumbar puncture as well as persistent headache which lasted for about 10 days postprocedure.  It only got better a couple of days ago.  Tolerating chemotherapy well otherwise without any significant side effects.  Denies any nausea or vomiting.  Dysphagia has improved on PPI  ECOG PS- 0 Pain scale- 0   Review of systems- Review of Systems  Constitutional:  Positive for malaise/fatigue. Negative for chills, fever and weight loss.  HENT:  Negative for congestion, ear discharge  and nosebleeds.   Eyes:  Negative for blurred vision.  Respiratory:  Negative for cough, hemoptysis, sputum production, shortness of breath and wheezing.   Cardiovascular:  Negative for chest pain, palpitations, orthopnea and claudication.   Gastrointestinal:  Negative for abdominal pain, blood in stool, constipation, diarrhea, heartburn, melena, nausea and vomiting.  Genitourinary:  Negative for dysuria, flank pain, frequency, hematuria and urgency.  Musculoskeletal:  Negative for back pain, joint pain and myalgias.  Skin:  Negative for rash.  Neurological:  Negative for dizziness, tingling, focal weakness, seizures, weakness and headaches.  Endo/Heme/Allergies:  Does not bruise/bleed easily.  Psychiatric/Behavioral:  Negative for depression and suicidal ideas. The patient does not have insomnia.       No Known Allergies   History reviewed. No pertinent past medical history.   Past Surgical History:  Procedure Laterality Date   FISTULOTOMY N/A 01/10/2018   Procedure: FISTULOTOMY;  Surgeon: Eldred Grego, MD;  Location: [facility] ORS;  Service: General;  Laterality: N/A;   MASS BIOPSY Left 12/28/2023   Procedure: NECK MASS BIOPSY;  Surgeon: Alben Alma, MD;  Location: [facility] ORS;  Service: General;  Laterality: Left;   PORTACATH PLACEMENT N/A 12/28/2023   Procedure: INSERTION PORT-A-CATH;  Surgeon: Alben Alma, MD;  Location: [facility] ORS;  Service: General;  Laterality: N/A;    Social History   Socioeconomic History   Marital status: Single    Spouse name: Not on file   Number of children: 4   Years of education: Not on file   Highest education level: Not on file  Occupational History   Not on file  Tobacco Use   Smoking status: Former    Types: Cigarettes    Passive exposure: Past   Smokeless tobacco: Never  Vaping Use   Vaping status: Never Used  Substance and Sexual Activity   Alcohol use: Not Currently   Drug use: Not Currently    Types: Cocaine    Comment: years ago    Sexual activity: Yes  Other Topics Concern   Not on file  Social History Narrative   Not on file   Social Drivers of Health   Financial Resource Strain: Not on file  Food Insecurity: No Food Insecurity (12/27/2023)    Hunger Vital Sign    Worried About Running Out of Food in the Last Year: Never true    Ran Out of Food in the Last Year: Never true  Transportation Needs: No Transportation Needs (12/27/2023)   PRAPARE - Administrator, Civil Service (Medical): No    Lack of Transportation (Non-Medical): No  Physical Activity: Inactive (12/27/2023)   Exercise Vital Sign    Days of Exercise per Week: 0 days    Minutes of Exercise per Session: 0 min  Stress: Not on file  Social Connections: Not on file  Intimate Partner Violence: Not At Risk (12/27/2023)   Humiliation, Afraid, Rape, and Kick questionnaire    Fear of Current or Ex-Partner: No    Emotionally Abused: No    Physically Abused: No    Sexually Abused: No    Family History  Problem Relation Age of Onset   Colon cancer Mother    Pancreatic cancer Father    Cancer Sister      Current Outpatient Medications:    acyclovir  (ZOVIRAX ) 400 MG tablet, Take 400 mg by mouth daily., Disp: , Rfl:    allopurinol  (ZYLOPRIM ) 300 MG tablet, Take 300 mg by mouth daily., Disp: , Rfl:  pantoprazole  (PROTONIX ) 20 MG tablet, Take 1 tablet (20 mg total) by mouth daily. Take 1 tablet (20 mg total) by mouth every morning., Disp: 30 tablet, Rfl: 2   sulfamethoxazole -trimethoprim  (BACTRIM  DS) 800-160 MG tablet, Take 1 tablet by mouth 2 (two) times daily., Disp: , Rfl:    HYDROcodone -acetaminophen  (NORCO/VICODIN) 5-325 MG tablet, Take 1 tablet by mouth every 6 (six) hours as needed for moderate pain (pain score 4-6). (Patient not taking: Reported on 04/01/2024), Disp: 15 tablet, Rfl: 0   predniSONE  (DELTASONE ) 20 MG tablet, Take 5 tablets (100 mg total) by mouth daily with breakfast for 5 days., Disp: 25 tablet, Rfl: 1 No current facility-administered medications for this visit.  Facility-Administered Medications Ordered in Other Visits:    0.9 %  sodium chloride  infusion, , Intravenous, Continuous, Avonne Boettcher, MD, Stopped at 01/29/24 1143   0.9 %   sodium chloride  infusion, , Intravenous, Continuous, Avonne Boettcher, MD, Stopped at 03/11/24 1228   0.9 %  sodium chloride  infusion, , Intravenous, Continuous, Avonne Boettcher, MD, Last Rate: 10 mL/hr at 04/01/24 0940, New Bag at 04/01/24 0940   DOXOrubicin  (ADRIAMYCIN ) 22 mg, etoposide  (VEPESID ) 110 mg, vinCRIStine  (ONCOVIN ) 0.7 mg in sodium chloride  0.9 % 500 mL chemo infusion, , Intravenous, Once, Avonne Boettcher, MD   methotrexate  (PF) 12 mg, hydrocortisone  sodium succinate (SOLU-CORTEF ) 50 mg in sodium chloride  (PF) 0.9 % INTRATHECAL chemo injection, , Intrathecal, Once, Avonne Boettcher, MD   sodium chloride  flush (NS) 0.9 % injection 10 mL, 10 mL, Intracatheter, PRN, Avonne Boettcher, MD, 10 mL at 01/30/24 1254  Physical exam:  Vitals:   04/01/24 0904  BP: 121/84  Pulse: 73  Resp: 20  Temp: 97.6 F (36.4 C)  SpO2: 100%  Weight: 170 lb 8 oz (77.3 kg)   Physical Exam Cardiovascular:     Rate and Rhythm: Normal rate and regular rhythm.     Heart sounds: Normal heart sounds.  Pulmonary:     Effort: Pulmonary effort is normal.     Breath sounds: Normal breath sounds.  Skin:    General: Skin is warm and dry.  Neurological:     Mental Status: He is alert and oriented to person, place, and time.      I have personally reviewed labs listed below:    Latest Ref Rng & Units 04/01/2024    8:44 AM  CMP  Glucose 70 - 99 mg/dL 161   BUN 8 - 23 mg/dL 10   Creatinine 0.96 - 1.24 mg/dL 0.45   Sodium 409 - 811 mmol/L 134   Potassium 3.5 - 5.1 mmol/L 4.0   Chloride 98 - 111 mmol/L 103   CO2 22 - 32 mmol/L 21   Calcium 8.9 - 10.3 mg/dL 8.3   Total Protein 6.5 - 8.1 g/dL 6.5   Total Bilirubin 0.0 - 1.2 mg/dL 0.4   Alkaline Phos 38 - 126 U/L 82   AST 15 - 41 U/L 26   ALT 0 - 44 U/L 22       Latest Ref Rng & Units 04/01/2024    8:44 AM  CBC  WBC 4.0 - 10.5 K/uL 18.9   Hemoglobin 13.0 - 17.0 g/dL 91.4   Hematocrit 78.2 - 52.0 % 34.4   Platelets 150 - 400 K/uL 212    I have personally  reviewed Radiology images listed below: No images are attached to the encounter.  DG FL LUMBAR PUNCTURE W/CHEMO INJECT Result Date: 03/19/2024 CLINICAL DATA:  Patient with a history of diffuse large B-cell lymphoma presents today for intrathecal injection of methotrexate  and hydrocortisone . EXAM: FLUOROSCOPICALLY GUIDED LUMBAR PUNCTURE FOR INTRATHECAL CHEMOTHERAPY FLUOROSCOPY TIME:  Radiation exposure index as provided by the fluoroscopic device 4.90 mGy PROCEDURE: Informed consent was obtained from the patient prior to the procedure, including potential complications or reactions to the medication, seizures, bleeding, infection, paresthesias, nerve damage, CSF leak requiring additional procedures, post procedure requirement to lay flat for several hours after the procedure, headache, allergy, and pain. With the patient prone, the lower back was prepped with Betadine. 1% Lidocaine  was used for local anesthesia. Lumbar puncture was performed at the L4-L5 level using a 20 gauge needle with return of clear, colorless CSF. A prefilled 10 mL sterile syringe from the pharmacy containing 12 mg of methotrexate  with 50 mg of hydrocortisone  sodium succinate was injected into the subarachnoid space. The inner stylet was placed back in the needle and the needle was removed. The patient tolerated the procedure well without apparent complication. IMPRESSION: Technically successful fluoroscopic guided lumbar puncture with intrathecal injection of chemotherapy. Procedure performed by Jetta Morrow NP and was supervised by Dr. Elene Griffes Electronically Signed   By: Elene Griffes M.D.   On: 03/19/2024 12:45     Assessment and plan- Patient is a 63 y.o. 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 He is s/p post 1 cycle of Pola R CHP chemotherapy and was subsequently switched to dose adjusted R-EPOCH chemotherapy.  He is here for on treatment assessment prior to cycle 4 of dose adjusted  R-EPOCH chemotherapy  Counts okay to proceed with dose adjusted R-EPOCH chemotherapy today.  He is receiving 20% dose increase as compared to cycle 1 and we are not increasing doses any further.  He has tolerated this well without any significant side effects.  White count is elevated today likely due to steroid use.  Continue to monitor.  Clinically no signs and symptoms of infection.  Patient had significant discomfort with lumbar puncture as well as headaches and has decided not to pursue any further.  I will see him back in 4 weeks for cycle 5 which would be his last cycle given that he received 1 dose of Pola or CHP chemotherapy for cycle 1.  We are doing treatment in 4 weeks instead of 3 weeks due to the [facility] long weekend.  Plan to repeat PET scan in about 5 weeks time.  He will take 5 days of Decadron  100 mg starting tomorrow.  He will come for CBC with differential checked twice a week in 1 week in 2 weeks.   Visit Diagnosis 1. Diffuse large B-cell lymphoma of intra-abdominal lymph nodes (HCC)   2. Encounter for antineoplastic chemotherapy   3. Encounter for monitoring rituximab  therapy   4. Lumbar puncture headache      Dr. Seretha Dance, MD, MPH [facility] [phone] 04/01/2024 10:35 AM

## 2024-04-02 ENCOUNTER — Other Ambulatory Visit: Payer: Self-pay

## 2024-04-02 ENCOUNTER — Inpatient Hospital Stay

## 2024-04-02 VITALS — BP 116/77 | HR 98 | Temp 98.8°F | Resp 18

## 2024-04-02 DIAGNOSIS — C8333 Diffuse large B-cell lymphoma, intra-abdominal lymph nodes: Secondary | ICD-10-CM

## 2024-04-02 DIAGNOSIS — Z5112 Encounter for antineoplastic immunotherapy: Secondary | ICD-10-CM | POA: Diagnosis not present

## 2024-04-02 MED ORDER — SODIUM CHLORIDE 0.9% FLUSH
10.0000 mL | INTRAVENOUS | Status: DC | PRN
  Filled 2024-04-02: qty 10

## 2024-04-02 MED ORDER — VINCRISTINE SULFATE CHEMO INJECTION 1 MG/ML
Freq: Once | INTRAVENOUS | Status: AC
  Filled 2024-04-02: qty 11

## 2024-04-03 ENCOUNTER — Inpatient Hospital Stay

## 2024-04-03 VITALS — BP 133/95 | HR 100 | Temp 99.4°F | Resp 18

## 2024-04-03 DIAGNOSIS — Z5112 Encounter for antineoplastic immunotherapy: Secondary | ICD-10-CM | POA: Diagnosis not present

## 2024-04-03 DIAGNOSIS — C8333 Diffuse large B-cell lymphoma, intra-abdominal lymph nodes: Secondary | ICD-10-CM

## 2024-04-03 MED ORDER — SODIUM CHLORIDE 0.9% FLUSH
10.0000 mL | INTRAVENOUS | Status: AC | PRN
  Administered 2024-04-03: 10 mL
  Filled 2024-04-03: qty 10

## 2024-04-03 MED ORDER — VINCRISTINE SULFATE CHEMO INJECTION 1 MG/ML
Freq: Once | INTRAVENOUS | Status: AC
  Filled 2024-04-03: qty 11

## 2024-04-03 MED ORDER — PALONOSETRON HCL INJECTION 0.25 MG/5ML
0.2500 mg | Freq: Once | INTRAVENOUS | Status: AC
  Administered 2024-04-03: 0.25 mg via INTRAVENOUS

## 2024-04-04 ENCOUNTER — Inpatient Hospital Stay

## 2024-04-04 VITALS — BP 144/77 | HR 80 | Temp 99.0°F | Resp 18

## 2024-04-04 DIAGNOSIS — C8333 Diffuse large B-cell lymphoma, intra-abdominal lymph nodes: Secondary | ICD-10-CM

## 2024-04-04 DIAGNOSIS — Z5112 Encounter for antineoplastic immunotherapy: Secondary | ICD-10-CM | POA: Diagnosis not present

## 2024-04-04 MED ORDER — VINCRISTINE SULFATE CHEMO INJECTION 1 MG/ML
Freq: Once | INTRAVENOUS | Status: AC
  Filled 2024-04-04: qty 11

## 2024-04-05 ENCOUNTER — Inpatient Hospital Stay

## 2024-04-05 VITALS — BP 159/75 | HR 91 | Temp 96.5°F | Resp 16

## 2024-04-05 DIAGNOSIS — Z5112 Encounter for antineoplastic immunotherapy: Secondary | ICD-10-CM | POA: Diagnosis not present

## 2024-04-05 DIAGNOSIS — C8333 Diffuse large B-cell lymphoma, intra-abdominal lymph nodes: Secondary | ICD-10-CM

## 2024-04-05 MED ORDER — PALONOSETRON HCL INJECTION 0.25 MG/5ML
0.2500 mg | Freq: Once | INTRAVENOUS | Status: AC
  Administered 2024-04-05: 0.25 mg via INTRAVENOUS
  Filled 2024-04-05: qty 5

## 2024-04-05 MED ORDER — SODIUM CHLORIDE 0.9% FLUSH
10.0000 mL | INTRAVENOUS | Status: DC | PRN
  Administered 2024-04-05: 10 mL
  Filled 2024-04-05: qty 10

## 2024-04-05 MED ORDER — HEPARIN SOD (PORK) LOCK FLUSH 100 UNIT/ML IV SOLN
500.0000 [IU] | Freq: Once | INTRAVENOUS | Status: AC | PRN
Start: 2024-04-05 — End: 2024-04-05
  Administered 2024-04-05: 500 [IU]
  Filled 2024-04-05: qty 5

## 2024-04-05 MED ORDER — SODIUM CHLORIDE 0.9 % IV SOLN
INTRAVENOUS | Status: DC
  Filled 2024-04-05: qty 250

## 2024-04-05 MED ORDER — SODIUM CHLORIDE 0.9 % IV SOLN
900.0000 mg/m2 | Freq: Once | INTRAVENOUS | Status: AC
  Administered 2024-04-05: 1500 mg via INTRAVENOUS
  Filled 2024-04-05: qty 75

## 2024-04-05 NOTE — Patient Instructions (Signed)
 Instrucciones al darle de alta: Discharge Instructions Gracias por elegir al [facility] de Cncer de [place] para brindarle atencin mdica de oncologa y Teacher, English as a foreign language.   Si usted tiene una cita de laboratorio con el Centro de Cncer, por favor vaya directamente al [employer] de Cncer y regstrese en el rea de Engineer, maintenance (IT).   Use ropa cmoda y [place] para tener fcil acceso a las vas del Portacath (acceso venoso de larga duracin) o la lnea PICC (catter central colocado por va perifrica).   Nos esforzamos por ofrecerle tiempo de calidad con su proveedor. Es posible que tenga que volver a programar su cita si llega tarde (15 minutos o ms).  El llegar tarde le afecta a usted y a otros pacientes cuyas citas son posteriores a Armed forces operational officer.  Adems, si usted falta a tres o ms citas sin avisar a la oficina, puede ser retirado(a) de la clnica a discrecin del proveedor.      Para las solicitudes de renovacin de recetas, pida a su farmacia que se ponga en contacto con nuestra oficina y deje que transcurran 72 horas para que se complete el proceso de las renovaciones.    Hoy usted recibi los siguientes agentes de quimioterapia e/o inmunoterapia- cytoxan       Para ayudar a prevenir las nuseas y los vmitos despus de su tratamiento, le recomendamos que tome su medicamento para las nuseas segn las indicaciones.  LOS SNTOMAS QUE DEBEN COMUNICARSE INMEDIATAMENTE SE INDICAN A CONTINUACIN: *FIEBRE SUPERIOR A 100.4 F (38 C) O MS *ESCALOFROS O SUDORACIN *NUSEAS Y VMITOS QUE NO SE CONTROLAN CON EL MEDICAMENTO PARA LAS NUSEAS *DIFICULTAD INUSUAL PARA RESPIRAR  *MORETONES O HEMORRAGIAS NO HABITUALES *PROBLEMAS URINARIOS (dolor o ardor al Geographical information systems officer o frecuencia para Geographical information systems officer) *PROBLEMAS INTESTINALES (diarrea inusual, estreimiento, dolor cerca del ano) SENSIBILIDAD EN LA BOCA Y EN LA GARGANTA CON O SIN LA PRESENCIA DE LCERAS (dolor de garganta, llagas en la boca o dolor de muelas/dientes) ERUPCIN,  HINCHAZN O DOLORES INUSUALES FLUJO VAGINAL INUSUAL O PICAZN/RASQUIA    Los puntos marcados con un asterisco ( *) indican una posible emergencia y debe hacer un seguimiento tan pronto como le sea posible o vaya al Departamento de Emergencias si se le presenta algn problema.  Por favor, muestre la [place] DE ADVERTENCIA DE Georgie Kiss DE ADVERTENCIA DE Annie Barton al registrarse en [address] de Emergencias y a la enfermera de triaje.  Si tiene preguntas despus de su visita o necesita cancelar o volver a programar su cita, por favor pngase en contacto con [facility] - A DEPT OF Tommas Fragmin [facility]  [phone]  y siga las instrucciones. Las horas de oficina son de 8:00 a.m. a 4:30 p.m. de lunes a viernes. Por favor, tenga en cuenta que los mensajes de voz que se dejan despus de las 4:00 p.m. posiblemente no se devolvern hasta el siguiente da de trabajo.  Cerramos los fines de semana y [employer]. En todo momento tiene acceso a una enfermera para preguntas urgentes. Por favor, llame al nmero principal de la clnica  [phone] y siga las instrucciones.   Para cualquier pregunta que no sea de carcter urgente, tambin puede ponerse en contacto con su proveedor utilizando MyChart. Ahora ofrecemos visitas electrnicas para cualquier persona mayor de 18 aos que solicite atencin mdica en lnea para los sntomas que no sean urgentes. Para ms detalles vaya a [email or web address].   Tambin puede bajar la aplicacin de MyChart! Vaya a la  tienda de aplicaciones, busque "MyChart", abra la aplicacin, seleccione [place], e ingrese con su nombre de usuario y la contrasea de Clinical cytogeneticist.

## 2024-04-07 ENCOUNTER — Encounter: Payer: Self-pay | Admitting: Oncology

## 2024-04-08 ENCOUNTER — Inpatient Hospital Stay

## 2024-04-08 DIAGNOSIS — C8333 Diffuse large B-cell lymphoma, intra-abdominal lymph nodes: Secondary | ICD-10-CM

## 2024-04-08 DIAGNOSIS — Z5112 Encounter for antineoplastic immunotherapy: Secondary | ICD-10-CM | POA: Diagnosis not present

## 2024-04-08 LAB — CBC WITH DIFFERENTIAL (CANCER CENTER ONLY)
Abs Immature Granulocytes: 0.1 10*3/uL — ABNORMAL HIGH (ref 0.00–0.07)
Basophils Absolute: 0.1 10*3/uL (ref 0.0–0.1)
Basophils Relative: 0 %
Eosinophils Absolute: 0.2 10*3/uL (ref 0.0–0.5)
Eosinophils Relative: 1 %
HCT: 35.1 % — ABNORMAL LOW (ref 39.0–52.0)
Hemoglobin: 11.9 g/dL — ABNORMAL LOW (ref 13.0–17.0)
Immature Granulocytes: 1 %
Lymphocytes Relative: 6 %
Lymphs Abs: 0.7 10*3/uL (ref 0.7–4.0)
MCH: 30.3 pg (ref 26.0–34.0)
MCHC: 33.9 g/dL (ref 30.0–36.0)
MCV: 89.3 fL (ref 80.0–100.0)
Monocytes Absolute: 0.1 10*3/uL (ref 0.1–1.0)
Monocytes Relative: 0 %
Neutro Abs: 11.2 10*3/uL — ABNORMAL HIGH (ref 1.7–7.7)
Neutrophils Relative %: 92 %
Platelet Count: 274 10*3/uL (ref 150–400)
RBC: 3.93 MIL/uL — ABNORMAL LOW (ref 4.22–5.81)
RDW: 16.2 % — ABNORMAL HIGH (ref 11.5–15.5)
Smear Review: NORMAL
WBC Count: 12.2 10*3/uL — ABNORMAL HIGH (ref 4.0–10.5)
nRBC: 0 % (ref 0.0–0.2)

## 2024-04-08 MED ORDER — PEGFILGRASTIM-CBQV 6 MG/0.6ML ~~LOC~~ SOSY
6.0000 mg | PREFILLED_SYRINGE | Freq: Once | SUBCUTANEOUS | Status: AC
  Administered 2024-04-08: 6 mg via SUBCUTANEOUS
  Filled 2024-04-08: qty 0.6

## 2024-04-08 NOTE — Progress Notes (Signed)
 Confirmed with MD to proceed with Udenyca  inj today

## 2024-04-09 ENCOUNTER — Encounter: Payer: Self-pay | Admitting: Oncology

## 2024-04-12 ENCOUNTER — Inpatient Hospital Stay

## 2024-04-12 DIAGNOSIS — Z5112 Encounter for antineoplastic immunotherapy: Secondary | ICD-10-CM | POA: Diagnosis not present

## 2024-04-12 DIAGNOSIS — C8333 Diffuse large B-cell lymphoma, intra-abdominal lymph nodes: Secondary | ICD-10-CM

## 2024-04-12 LAB — CBC WITH DIFFERENTIAL (CANCER CENTER ONLY)
Abs Immature Granulocytes: 0.5 10*3/uL — ABNORMAL HIGH (ref 0.00–0.07)
Basophils Absolute: 0.1 10*3/uL (ref 0.0–0.1)
Basophils Relative: 1 %
Eosinophils Absolute: 0.1 10*3/uL (ref 0.0–0.5)
Eosinophils Relative: 1 %
HCT: 30.4 % — ABNORMAL LOW (ref 39.0–52.0)
Hemoglobin: 10.1 g/dL — ABNORMAL LOW (ref 13.0–17.0)
Immature Granulocytes: 8 %
Lymphocytes Relative: 12 %
Lymphs Abs: 0.8 10*3/uL (ref 0.7–4.0)
MCH: 30.1 pg (ref 26.0–34.0)
MCHC: 33.2 g/dL (ref 30.0–36.0)
MCV: 90.7 fL (ref 80.0–100.0)
Monocytes Absolute: 1 10*3/uL (ref 0.1–1.0)
Monocytes Relative: 15 %
Neutro Abs: 3.9 10*3/uL (ref 1.7–7.7)
Neutrophils Relative %: 63 %
Platelet Count: 143 10*3/uL — ABNORMAL LOW (ref 150–400)
RBC: 3.35 MIL/uL — ABNORMAL LOW (ref 4.22–5.81)
RDW: 15.8 % — ABNORMAL HIGH (ref 11.5–15.5)
Smear Review: NORMAL
WBC Count: 6.2 10*3/uL (ref 4.0–10.5)
nRBC: 0 % (ref 0.0–0.2)

## 2024-04-15 ENCOUNTER — Inpatient Hospital Stay

## 2024-04-15 DIAGNOSIS — C8333 Diffuse large B-cell lymphoma, intra-abdominal lymph nodes: Secondary | ICD-10-CM

## 2024-04-15 DIAGNOSIS — Z5112 Encounter for antineoplastic immunotherapy: Secondary | ICD-10-CM | POA: Diagnosis not present

## 2024-04-15 LAB — CBC WITH DIFFERENTIAL (CANCER CENTER ONLY)
Abs Immature Granulocytes: 1.84 10*3/uL — ABNORMAL HIGH (ref 0.00–0.07)
Basophils Absolute: 0.1 10*3/uL (ref 0.0–0.1)
Basophils Relative: 1 %
Eosinophils Absolute: 0.1 10*3/uL (ref 0.0–0.5)
Eosinophils Relative: 1 %
HCT: 31.9 % — ABNORMAL LOW (ref 39.0–52.0)
Hemoglobin: 10.6 g/dL — ABNORMAL LOW (ref 13.0–17.0)
Immature Granulocytes: 17 %
Lymphocytes Relative: 9 %
Lymphs Abs: 0.9 10*3/uL (ref 0.7–4.0)
MCH: 30.5 pg (ref 26.0–34.0)
MCHC: 33.2 g/dL (ref 30.0–36.0)
MCV: 91.9 fL (ref 80.0–100.0)
Monocytes Absolute: 0.8 10*3/uL (ref 0.1–1.0)
Monocytes Relative: 8 %
Neutro Abs: 6.9 10*3/uL (ref 1.7–7.7)
Neutrophils Relative %: 64 %
Platelet Count: 113 10*3/uL — ABNORMAL LOW (ref 150–400)
RBC: 3.47 MIL/uL — ABNORMAL LOW (ref 4.22–5.81)
RDW: 16.9 % — ABNORMAL HIGH (ref 11.5–15.5)
Smear Review: NORMAL
WBC Count: 10.6 10*3/uL — ABNORMAL HIGH (ref 4.0–10.5)
nRBC: 0.7 % — ABNORMAL HIGH (ref 0.0–0.2)

## 2024-04-17 ENCOUNTER — Telehealth: Payer: Self-pay | Admitting: *Deleted

## 2024-04-17 NOTE — Telephone Encounter (Signed)
 Please call natalie. This delay is way more than expected. We are due to check next clonoseq on him after he finishes up chemo ina few weeks time

## 2024-04-17 NOTE — Telephone Encounter (Signed)
 [name] from [facility] calling back for a issue that Terrea Ferrier had already called about.  Mathis Som said that there is a delay called because the lab is behind sending out the material and they are behind with cutting slides.  [name] said if you have any other questions you can call the lab  [phone].

## 2024-04-17 NOTE — Telephone Encounter (Signed)
 Outbound call to check status of Clonoseq specimen; it was previously communicated specimen would result by 04/17/24.  Spoke to Megan who says it doesn't look like they've received it.  Was transferred to [name] on the pathology team.  The lab did not end up sending out that material; sees note that they're backed up on this request.   Mathis Som will call and follow up and see what's causing delays; provided the telephone number she will reach out to [phone].  [name] said she would give me a call back after she's touched base with them.

## 2024-04-17 NOTE — Telephone Encounter (Signed)
 William Mathews from [facility] pathology said yesterday she was able to get [facility] department to send it out and they started the slides and cut them yesterday also.  He says if you need anything just to call in the number again [phone] with extension [phone]

## 2024-04-17 NOTE — Telephone Encounter (Signed)
 Per Sherry's message from 04/17/24 at 1:10PM "Sammy from [facility] pathology said yesterday she was able to get [facility] department to send it out and they started the slides and cut them yesterday also.  He says if you need anything just to call in the number again [phone] with extension [phone]".  [facility]. I sounds like the lady calling is Sammy.  Her telephone number [phone] extension [phone]

## 2024-04-17 NOTE — Telephone Encounter (Signed)
 After reviewing previous note with representative she Transferred me to [facility] in lab who handles slide send outs; voice message left.

## 2024-04-18 NOTE — Telephone Encounter (Signed)
 Spoke to Sammy who indicated that the material was sent out to [facility] on Tuesday she believes. Asked for a telephone number for [facility] to get an up to date status.  Sammy said she was covering the front desk and did not have the number readily available.  She will call back shortly with the contact info for them.

## 2024-04-19 ENCOUNTER — Inpatient Hospital Stay

## 2024-04-19 DIAGNOSIS — C8333 Diffuse large B-cell lymphoma, intra-abdominal lymph nodes: Secondary | ICD-10-CM

## 2024-04-19 DIAGNOSIS — Z5112 Encounter for antineoplastic immunotherapy: Secondary | ICD-10-CM | POA: Diagnosis not present

## 2024-04-19 LAB — CBC WITH DIFFERENTIAL (CANCER CENTER ONLY)
Abs Immature Granulocytes: 2.19 10*3/uL — ABNORMAL HIGH (ref 0.00–0.07)
Basophils Absolute: 0.2 10*3/uL — ABNORMAL HIGH (ref 0.0–0.1)
Basophils Relative: 1 %
Eosinophils Absolute: 0.1 10*3/uL (ref 0.0–0.5)
Eosinophils Relative: 1 %
HCT: 35.3 % — ABNORMAL LOW (ref 39.0–52.0)
Hemoglobin: 11.5 g/dL — ABNORMAL LOW (ref 13.0–17.0)
Immature Granulocytes: 15 %
Lymphocytes Relative: 7 %
Lymphs Abs: 1.1 10*3/uL (ref 0.7–4.0)
MCH: 29.8 pg (ref 26.0–34.0)
MCHC: 32.6 g/dL (ref 30.0–36.0)
MCV: 91.5 fL (ref 80.0–100.0)
Monocytes Absolute: 1.1 10*3/uL — ABNORMAL HIGH (ref 0.1–1.0)
Monocytes Relative: 8 %
Neutro Abs: 10.3 10*3/uL — ABNORMAL HIGH (ref 1.7–7.7)
Neutrophils Relative %: 68 %
Platelet Count: 147 10*3/uL — ABNORMAL LOW (ref 150–400)
RBC: 3.86 MIL/uL — ABNORMAL LOW (ref 4.22–5.81)
RDW: 16 % — ABNORMAL HIGH (ref 11.5–15.5)
Smear Review: DECREASED
WBC Count: 15 10*3/uL — ABNORMAL HIGH (ref 4.0–10.5)
nRBC: 0.2 % (ref 0.0–0.2)

## 2024-04-19 NOTE — Telephone Encounter (Signed)
 Per Dr. Randy Buttery the plan is to complete another Clonoseq when patient finishes treatment (tentatively listed as 05/06/24) and requested nothing be sent back out, to hold onto the slides to not delay the next processing. Spoke to [place] and he indicated if the ID test is successful , the next time they run another Clonoseq it should take approximately two weeks.  John stated the ID order was held up by the pathology lab and it took them a while to send the specimen.  John approximated lab should hopefully result by 04/27/24.

## 2024-04-19 NOTE — Telephone Encounter (Signed)
 Voice message received from Sammy from [facility] Pathology indicating the telephone number for [employer] Team [phone] option 4.  Outbound call; spoke to [place].   Received slides that same day at the end of the day; ID order is currently processing can take up to 2 weeks.  As for the tracking order looks like waiting for ID to process and then they'll start on the tracking order.

## 2024-04-23 ENCOUNTER — Other Ambulatory Visit

## 2024-04-23 ENCOUNTER — Ambulatory Visit: Admitting: Oncology

## 2024-04-23 ENCOUNTER — Ambulatory Visit

## 2024-04-24 ENCOUNTER — Encounter: Payer: Self-pay | Admitting: Oncology

## 2024-04-24 NOTE — Telephone Encounter (Signed)
 Clonoseq resulted

## 2024-04-27 ENCOUNTER — Encounter: Payer: Self-pay | Admitting: Oncology

## 2024-04-29 ENCOUNTER — Encounter: Payer: Self-pay | Admitting: Oncology

## 2024-04-29 ENCOUNTER — Inpatient Hospital Stay: Attending: Oncology | Admitting: Oncology

## 2024-04-29 ENCOUNTER — Inpatient Hospital Stay

## 2024-04-29 VITALS — BP 123/77 | HR 88 | Temp 98.6°F | Resp 16

## 2024-04-29 VITALS — BP 117/78 | HR 76 | Temp 98.1°F | Resp 16 | Wt 173.0 lb

## 2024-04-29 DIAGNOSIS — R109 Unspecified abdominal pain: Secondary | ICD-10-CM | POA: Insufficient documentation

## 2024-04-29 DIAGNOSIS — R7989 Other specified abnormal findings of blood chemistry: Secondary | ICD-10-CM | POA: Insufficient documentation

## 2024-04-29 DIAGNOSIS — Z79620 Long term (current) use of immunosuppressive biologic: Secondary | ICD-10-CM | POA: Insufficient documentation

## 2024-04-29 DIAGNOSIS — R519 Headache, unspecified: Secondary | ICD-10-CM | POA: Insufficient documentation

## 2024-04-29 DIAGNOSIS — Z79899 Other long term (current) drug therapy: Secondary | ICD-10-CM | POA: Insufficient documentation

## 2024-04-29 DIAGNOSIS — Z5112 Encounter for antineoplastic immunotherapy: Secondary | ICD-10-CM | POA: Insufficient documentation

## 2024-04-29 DIAGNOSIS — Z79634 Long term (current) use of topoisomerase inhibitor: Secondary | ICD-10-CM | POA: Diagnosis not present

## 2024-04-29 DIAGNOSIS — Z5111 Encounter for antineoplastic chemotherapy: Secondary | ICD-10-CM | POA: Insufficient documentation

## 2024-04-29 DIAGNOSIS — Z5189 Encounter for other specified aftercare: Secondary | ICD-10-CM | POA: Insufficient documentation

## 2024-04-29 DIAGNOSIS — R6881 Early satiety: Secondary | ICD-10-CM | POA: Insufficient documentation

## 2024-04-29 DIAGNOSIS — C8333 Diffuse large B-cell lymphoma, intra-abdominal lymph nodes: Secondary | ICD-10-CM

## 2024-04-29 DIAGNOSIS — Z809 Family history of malignant neoplasm, unspecified: Secondary | ICD-10-CM | POA: Insufficient documentation

## 2024-04-29 DIAGNOSIS — Z800 Family history of malignant neoplasm of digestive organs: Secondary | ICD-10-CM | POA: Insufficient documentation

## 2024-04-29 DIAGNOSIS — Z87891 Personal history of nicotine dependence: Secondary | ICD-10-CM | POA: Diagnosis not present

## 2024-04-29 DIAGNOSIS — Z79624 Long term (current) use of inhibitors of nucleotide synthesis: Secondary | ICD-10-CM | POA: Insufficient documentation

## 2024-04-29 DIAGNOSIS — R140 Abdominal distension (gaseous): Secondary | ICD-10-CM | POA: Diagnosis not present

## 2024-04-29 DIAGNOSIS — R232 Flushing: Secondary | ICD-10-CM | POA: Insufficient documentation

## 2024-04-29 DIAGNOSIS — Z79633 Long term (current) use of mitotic inhibitor: Secondary | ICD-10-CM | POA: Insufficient documentation

## 2024-04-29 DIAGNOSIS — Z5181 Encounter for therapeutic drug level monitoring: Secondary | ICD-10-CM

## 2024-04-29 DIAGNOSIS — C8338 Diffuse large B-cell lymphoma, lymph nodes of multiple sites: Secondary | ICD-10-CM | POA: Diagnosis present

## 2024-04-29 DIAGNOSIS — Z79631 Long term (current) use of antimetabolite agent: Secondary | ICD-10-CM | POA: Insufficient documentation

## 2024-04-29 LAB — CMP (CANCER CENTER ONLY)
ALT: 20 U/L (ref 0–44)
AST: 30 U/L (ref 15–41)
Albumin: 4 g/dL (ref 3.5–5.0)
Alkaline Phosphatase: 70 U/L (ref 38–126)
Anion gap: 11 (ref 5–15)
BUN: 13 mg/dL (ref 8–23)
CO2: 20 mmol/L — ABNORMAL LOW (ref 22–32)
Calcium: 8.2 mg/dL — ABNORMAL LOW (ref 8.9–10.3)
Chloride: 105 mmol/L (ref 98–111)
Creatinine: 0.81 mg/dL (ref 0.61–1.24)
GFR, Estimated: >60 mL/min (ref >60)
Glucose, Bld: 133 mg/dL — ABNORMAL HIGH (ref 70–99)
Potassium: 3.8 mmol/L (ref 3.5–5.1)
Sodium: 136 mmol/L (ref 135–145)
Total Bilirubin: 0.5 mg/dL (ref 0.0–1.2)
Total Protein: 6.5 g/dL (ref 6.5–8.1)

## 2024-04-29 LAB — CBC WITH DIFFERENTIAL/PLATELET
Abs Immature Granulocytes: 0.06 10*3/uL (ref 0.00–0.07)
Basophils Absolute: 0 10*3/uL (ref 0.0–0.1)
Basophils Relative: 1 %
Eosinophils Absolute: 0.1 10*3/uL (ref 0.0–0.5)
Eosinophils Relative: 1 %
HCT: 33.4 % — ABNORMAL LOW (ref 39.0–52.0)
Hemoglobin: 11.1 g/dL — ABNORMAL LOW (ref 13.0–17.0)
Immature Granulocytes: 1 %
Lymphocytes Relative: 13 %
Lymphs Abs: 0.9 10*3/uL (ref 0.7–4.0)
MCH: 30.3 pg (ref 26.0–34.0)
MCHC: 33.2 g/dL (ref 30.0–36.0)
MCV: 91.3 fL (ref 80.0–100.0)
Monocytes Absolute: 0.8 10*3/uL (ref 0.1–1.0)
Monocytes Relative: 13 %
Neutro Abs: 4.7 10*3/uL (ref 1.7–7.7)
Neutrophils Relative %: 71 %
Platelets: 215 10*3/uL (ref 150–400)
RBC: 3.66 MIL/uL — ABNORMAL LOW (ref 4.22–5.81)
RDW: 15.5 % (ref 11.5–15.5)
WBC: 6.6 10*3/uL (ref 4.0–10.5)
nRBC: 0 % (ref 0.0–0.2)

## 2024-04-29 MED ORDER — VINCRISTINE SULFATE CHEMO INJECTION 1 MG/ML
Freq: Once | INTRAVENOUS | Status: AC
  Filled 2024-04-29: qty 11

## 2024-04-29 MED ORDER — SODIUM CHLORIDE 0.9 % IV SOLN
375.0000 mg/m2 | Freq: Once | INTRAVENOUS | Status: AC
  Administered 2024-04-29: 700 mg via INTRAVENOUS
  Filled 2024-04-29: qty 20
  Filled 2024-04-29: qty 70

## 2024-04-29 MED ORDER — PALONOSETRON HCL INJECTION 0.25 MG/5ML
0.2500 mg | Freq: Once | INTRAVENOUS | Status: AC
  Administered 2024-04-29: 0.25 mg via INTRAVENOUS
  Filled 2024-04-29: qty 5

## 2024-04-29 MED ORDER — ACETAMINOPHEN 325 MG PO TABS
650.0000 mg | ORAL_TABLET | Freq: Once | ORAL | Status: AC
  Administered 2024-04-29: 650 mg via ORAL
  Filled 2024-04-29: qty 2

## 2024-04-29 MED ORDER — DIPHENHYDRAMINE HCL 25 MG PO CAPS
50.0000 mg | ORAL_CAPSULE | Freq: Once | ORAL | Status: AC
  Administered 2024-04-29: 50 mg via ORAL
  Filled 2024-04-29: qty 2

## 2024-04-29 MED ORDER — SODIUM CHLORIDE 0.9 % IV SOLN
INTRAVENOUS | Status: DC
  Filled 2024-04-29: qty 250

## 2024-04-29 NOTE — Progress Notes (Signed)
 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  He had a CT abdomen and pelvis with contrast which showed large soft tissue mass along the mesentery measuring up to 21 cm with encasement of central mesenteric vessels and loss of tissue planes between the mass and several bowel loops.  Caliber change along the third portion of duodenum with partial mild obstruction.  Findings concerning for lymphoma.  Serum creatinine mildly elevated at 1.4.  Spleen slightly enlarged without intrinsic splenic mass.  Patient denies any significant nausea or vomiting.  He has early satiety   PET CT scan from 12/22/2023 showed Hypermetabolic left supraclavicular mediastinal retroperitoneal nodes along with large central mesenteric nodal mass consistent with lymphoma.  SUV in these areas ranging around SUV 16.  Central mesenteric nodal mass extending into the upper pelvis.  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  Cells positive for CD20 with coexpression of CD10 and BCL6 consistent with germinal center/folic killer phenotype and aberrantly express  Bcl-2 strongly.  MOM1 and CD30 are negative.  EBV ISH negative.  Proliferation ranges from 20 to 80% with overall average of 60 to 70%.  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       Patient received Pola R CHP chemotherapy for cycle 1.  Double hit testing showed make gene rearrangement at 75%. Although Bcl-2 and BCL6 rearrangement was detected on FISH these were not known partners to make and therefore it was reported as a pseudo double hit.  However given the fact that this is a transformed diffuse large B-cell lymphoma from a pre-existing follicular lymphoma this would fit the pattern of a double hit lymphoma/ high grade B cell lymphoma.  Plan was therefore made to switch him to dose adjusted R-EPOCH chemotherapy for the remainder of the cycles   Initially plan was not to offer CNS prophylaxis however given that his double hit lymphoma with an intermediate risk score of 3 intrathecal methotrexate  will be given if tolerated for 4 cycles.  Patient went through 2 cycles of intrathecal methotrexate  but following that it was stopped due to significant headache  Interval history-patient is doing well and is tolerating chemotherapy well without any significant side effects.  He reports no complaints today.  History obtained with the help of Spanish interpreter  ECOG PS- 0 Pain scale- 0   Review of systems- Review of Systems  Constitutional:  Negative for chills, fever, malaise/fatigue and weight loss.  HENT:  Negative for congestion, ear discharge and nosebleeds.  Eyes:  Negative for blurred vision.  Respiratory:  Negative for cough, hemoptysis, sputum production, shortness of breath and wheezing.   Cardiovascular:  Negative for chest pain, palpitations, orthopnea and claudication.  Gastrointestinal:   Negative for abdominal pain, blood in stool, constipation, diarrhea, heartburn, melena, nausea and vomiting.  Genitourinary:  Negative for dysuria, flank pain, frequency, hematuria and urgency.  Musculoskeletal:  Negative for back pain, joint pain and myalgias.  Skin:  Negative for rash.  Neurological:  Negative for dizziness, tingling, focal weakness, seizures, weakness and headaches.  Endo/Heme/Allergies:  Does not bruise/bleed easily.  Psychiatric/Behavioral:  Negative for depression and suicidal ideas. The patient does not have insomnia.       No Known Allergies   No past medical history on file.   Past Surgical History:  Procedure Laterality Date   FISTULOTOMY N/A 01/10/2018   Procedure: FISTULOTOMY;  Surgeon: Eldred Grego, MD;  Location: [facility] ORS;  Service: General;  Laterality: N/A;   MASS BIOPSY Left 12/28/2023   Procedure: NECK MASS BIOPSY;  Surgeon: Alben Alma, MD;  Location: [facility] OR[facility];  Service: General;  Laterality: Left;   PORTACATH PLACEMENT N/A 12/28/2023   Procedure: INSERTION PORT-A-CATH;  Surgeon: Alben Alma, MD;  Location: [facility] OR[facility];  Service: General;  Laterality: N/A;    Social History   Socioeconomic History   Marital status: Single    Spouse name: Not on file   Number of children: 4   Years of education: Not on file   Highest education level: Not on file  Occupational History   Not on file  Tobacco Use   Smoking status: Former    Types: Cigarettes    Passive exposure: Past   Smokeless tobacco: Never  Vaping Use   Vaping status: Never Used  Substance and Sexual Activity   Alcohol use: Not Currently   Drug use: Not Currently    Types: Cocaine    Comment: years ago    Sexual activity: Yes  Other Topics Concern   Not on file  Social History Narrative   Not on file   Social Drivers of Health   Financial Resource Strain: Not on file  Food Insecurity: No Food Insecurity (12/27/2023)   Hunger Vital Sign    Worried About Running  Out of Food in the Last Year: Never true    Ran Out of Food in the Last Year: Never true  Transportation Needs: No Transportation Needs (12/27/2023)   PRAPARE - Administrator, Civil Service (Medical): No    Lack of Transportation (Non-Medical): No  Physical Activity: Inactive (12/27/2023)   Exercise Vital Sign    Days of Exercise per Week: 0 days    Minutes of Exercise per Session: 0 min  Stress: Not on file  Social Connections: Not on file  Intimate Partner Violence: Not At Risk (12/27/2023)   Humiliation, Afraid, Rape, and Kick questionnaire    Fear of Current or Ex-Partner: No    Emotionally Abused: No    Physically Abused: No    Sexually Abused: No    Family History  Problem Relation Age of Onset   Colon cancer Mother    Pancreatic cancer Father    Cancer Sister      Current Outpatient Medications:    acyclovir  (ZOVIRAX ) 400 MG tablet, Take 400 mg by mouth daily., Disp: , Rfl:    allopurinol  (ZYLOPRIM ) 300 MG tablet, Take 300 mg by mouth daily., Disp: , Rfl:    HYDROcodone -acetaminophen  (NORCO/VICODIN) 5-325 MG tablet,  Take 1 tablet by mouth every 6 (six) hours as needed for moderate pain (pain score 4-6). (Patient not taking: Reported on 04/01/2024), Disp: 15 tablet, Rfl: 0   pantoprazole  (PROTONIX ) 20 MG tablet, Take 1 tablet (20 mg total) by mouth daily. Take 1 tablet (20 mg total) by mouth every morning., Disp: 30 tablet, Rfl: 2   sulfamethoxazole -trimethoprim  (BACTRIM  DS) 800-160 MG tablet, Take 1 tablet by mouth 2 (two) times daily., Disp: , Rfl:  No current facility-administered medications for this visit.  Facility-Administered Medications Ordered in Other Visits:    0.9 %  sodium chloride  infusion, , Intravenous, Continuous, Avonne Boettcher, MD, Stopped at 01/29/24 1143   0.9 %  sodium chloride  infusion, , Intravenous, Continuous, Avonne Boettcher, MD, Stopped at 03/11/24 1228   0.9 %  sodium chloride  infusion, , Intravenous, Continuous, Avonne Boettcher, MD,  Stopped at 04/01/24 1156   methotrexate  (PF) 12 mg, hydrocortisone  sodium succinate (SOLU-CORTEF ) 50 mg in sodium chloride  (PF) 0.9 % INTRATHECAL chemo injection, , Intrathecal, Once, Avonne Boettcher, MD   sodium chloride  flush (NS) 0.9 % injection 10 mL, 10 mL, Intracatheter, PRN, Avonne Boettcher, MD, 10 mL at 01/30/24 1254   sodium chloride  flush (NS) 0.9 % injection 10 mL, 10 mL, Intracatheter, PRN, Avonne Boettcher, MD, 10 mL at 04/03/24 1327  Physical exam:  Vitals:   04/29/24 0852  BP: 117/78  Pulse: 76  Resp: 16  Temp: 98.1 F (36.7 C)  TempSrc: Tympanic  SpO2: 100%  Weight: 173 lb (78.5 kg)   Physical Exam Cardiovascular:     Rate and Rhythm: Normal rate and regular rhythm.     Heart sounds: Normal heart sounds.  Pulmonary:     Effort: Pulmonary effort is normal.     Breath sounds: Normal breath sounds.  Skin:    General: Skin is warm and dry.  Neurological:     Mental Status: He is alert and oriented to person, place, and time.      I have personally reviewed labs listed below:    Latest Ref Rng & Units 04/01/2024    8:44 AM  CMP  Glucose 70 - 99 mg/dL 578   BUN 8 - 23 mg/dL 10   Creatinine 4.69 - 1.24 mg/dL 6.29   Sodium 528 - 413 mmol/L 134   Potassium 3.5 - 5.1 mmol/L 4.0   Chloride 98 - 111 mmol/L 103   CO2 22 - 32 mmol/L 21   Calcium 8.9 - 10.3 mg/dL 8.3   Total Protein 6.5 - 8.1 g/dL 6.5   Total Bilirubin 0.0 - 1.2 mg/dL 0.4   Alkaline Phos 38 - 126 U/L 82   AST 15 - 41 U/L 26   ALT 0 - 44 U/L 22       Latest Ref Rng & Units 04/19/2024    9:00 AM  CBC  WBC 4.0 - 10.5 K/uL 15.0   Hemoglobin 13.0 - 17.0 g/dL 24.4   Hematocrit 01.0 - 52.0 % 35.3   Platelets 150 - 400 K/uL 147      Assessment and plan- Patient is a 63 y.o. 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 He is s/p post 1 cycle of Pola R CHP chemotherapy and was subsequently switched to dose adjusted R-EPOCH chemotherapy.  He is here for on treatment  assessment prior to cycle 5 of dose adjusted R-EPOCH chemotherapy  Counts okay to proceed with cycle 5 of dose adjusted  R-EPOCH chemotherapy which would be his last cycle given that he received Pola RCHP chemotherapy for cycle 1.  I will plan to repeat PET CT scan in about 3 weeks from now and see him thereafter.  We have also been doing clonoseq testing done which showed detectable clones at diagnosis but after 3 cycles of treatment there were no detectable B-cell clones noted in his peripheral blood from the sample that was collected in April 2025.  We will be repeating another one after the end of treatment as well.  We will keep the port in place for now and keep it flushed every 3 months.   Visit Diagnosis 1. Encounter for antineoplastic chemotherapy   2. Encounter for monitoring rituximab  therapy   3. Diffuse large B-cell lymphoma of intra-abdominal lymph nodes (HCC)      Dr. Seretha Dance, MD, MPH [facility] at [facility] [phone] 04/29/2024 8:34 AM

## 2024-04-29 NOTE — Patient Instructions (Signed)
 Instrucciones al darle de alta: Discharge Instructions Gracias por elegir al [facility] de Cncer de [place] para brindarle atencin mdica de oncologa y Teacher, English as a foreign language.   Si usted tiene una cita de laboratorio con [employer] de [place], por favor vaya directamente al [employer] de Cncer y regstrese en el rea de Engineer, maintenance (IT).   Use ropa cmoda y [place] para tener fcil acceso a las vas del Portacath (acceso venoso de Set designer duracin) o la lnea PICC (catter central colocado por va perifrica).   Nos esforzamos por ofrecerle tiempo de calidad con su proveedor. Es posible que tenga que volver a programar su cita si llega tarde (15 minutos o ms).  El llegar tarde le afecta a usted y a otros pacientes cuyas citas son posteriores a Armed forces operational officer.  Adems, si usted falta a tres o ms citas sin avisar a la oficina, puede ser retirado(a) de la clnica a discrecin del proveedor.      Para las solicitudes de renovacin de recetas, pida a su farmacia que se ponga en contacto con nuestra oficina y deje que transcurran 72 horas para que se complete el proceso de las renovaciones.     Para ayudar a prevenir las nuseas y los vmitos despus de su tratamiento, le recomendamos que tome su medicamento para las nuseas segn las indicaciones.  LOS SNTOMAS QUE DEBEN COMUNICARSE INMEDIATAMENTE SE INDICAN A CONTINUACIN: *FIEBRE SUPERIOR A 100.4 F (38 C) O MS *ESCALOFROS O SUDORACIN *NUSEAS Y VMITOS QUE NO SE CONTROLAN CON EL MEDICAMENTO PARA LAS NUSEAS *DIFICULTAD INUSUAL PARA RESPIRAR  *MORETONES O HEMORRAGIAS NO HABITUALES *PROBLEMAS URINARIOS (dolor o ardor al Geographical information systems officer o frecuencia para Geographical information systems officer) *PROBLEMAS INTESTINALES (diarrea inusual, estreimiento, dolor cerca del ano) SENSIBILIDAD EN LA BOCA Y EN LA GARGANTA CON O SIN LA PRESENCIA DE LCERAS (dolor de garganta, llagas en la boca o dolor de muelas/dientes) ERUPCIN, HINCHAZN O DOLORES INUSUALES FLUJO VAGINAL INUSUAL O PICAZN/RASQUIA    Los puntos marcados  con un asterisco ( *) indican una posible emergencia y debe hacer un seguimiento tan pronto como le sea posible o vaya al Departamento de Emergencias si se le presenta algn problema.  Por favor, muestre la [place] DE ADVERTENCIA DE Marc Morgans DE ADVERTENCIA DE Gard[place] Fanti al registrarse en [address] de Emergencias y a la enfermera de triaje.  Si tiene preguntas despus de su visita o necesita cancelar o volver a programar su cita, por favor pngase en contacto con [facility] - A DEPT OF [facility]igha Bridegroom [facility]  [phone]  y siga las instrucciones. Las horas de oficina son de 8:00 a.m. a 4:30 p.m. de lunes a viernes. Por favor, tenga en cuenta que los mensajes de voz que se dejan despus de las 4:00 p.m. posiblemente no se devolvern hasta el siguiente da de [place].  Cerramos los fines de semana y [employer]. En todo momento tiene acceso a una enfermera para preguntas urgentes. Por favor, llame al nmero principal de la clnica  [phone] y siga las instrucciones.   Para cualquier pregunta que no sea de carcter urgente, tambin puede ponerse en contacto con su proveedor [employer]. Ahora ofrecemos visitas electrnicas para cualquier persona mayor de 18 aos que solicite atencin mdica en lnea para los sntomas que no sean urgentes. Para ms detalles vaya a [email or web address].   Tambin puede bajar la aplicacin de MyChart! Vaya a la tienda de aplicaciones, busque "MyChart", abra la aplicacin, seleccione , e ingrese con su  nombre de usuario y la contrasea de Clinical cytogeneticist.

## 2024-04-30 ENCOUNTER — Encounter: Payer: Self-pay | Admitting: Oncology

## 2024-04-30 ENCOUNTER — Other Ambulatory Visit: Payer: Self-pay | Admitting: *Deleted

## 2024-04-30 ENCOUNTER — Other Ambulatory Visit: Payer: Self-pay | Admitting: Oncology

## 2024-04-30 ENCOUNTER — Inpatient Hospital Stay

## 2024-04-30 ENCOUNTER — Other Ambulatory Visit: Payer: Self-pay

## 2024-04-30 VITALS — BP 155/84 | HR 85 | Temp 97.8°F | Resp 18

## 2024-04-30 DIAGNOSIS — C8333 Diffuse large B-cell lymphoma, intra-abdominal lymph nodes: Secondary | ICD-10-CM

## 2024-04-30 DIAGNOSIS — C8213 Follicular lymphoma grade II, intra-abdominal lymph nodes: Secondary | ICD-10-CM

## 2024-04-30 DIAGNOSIS — Z5112 Encounter for antineoplastic immunotherapy: Secondary | ICD-10-CM | POA: Diagnosis not present

## 2024-04-30 MED ORDER — VINCRISTINE SULFATE CHEMO INJECTION 1 MG/ML
Freq: Once | INTRAVENOUS | Status: AC
  Filled 2024-04-30: qty 11

## 2024-04-30 MED ORDER — ACYCLOVIR 400 MG PO TABS: 400.0000 mg | ORAL_TABLET | Freq: Every day | ORAL | 0 refills | Status: DC

## 2024-05-01 ENCOUNTER — Inpatient Hospital Stay

## 2024-05-01 VITALS — BP 123/79 | HR 100 | Temp 99.6°F | Resp 18

## 2024-05-01 DIAGNOSIS — Z5112 Encounter for antineoplastic immunotherapy: Secondary | ICD-10-CM | POA: Diagnosis not present

## 2024-05-01 DIAGNOSIS — C8333 Diffuse large B-cell lymphoma, intra-abdominal lymph nodes: Secondary | ICD-10-CM

## 2024-05-01 MED ORDER — SODIUM CHLORIDE 0.9% FLUSH
10.0000 mL | INTRAVENOUS | Status: DC | PRN
  Filled 2024-05-01: qty 10

## 2024-05-01 MED ORDER — VINCRISTINE SULFATE CHEMO INJECTION 1 MG/ML
Freq: Once | INTRAVENOUS | Status: AC
  Filled 2024-05-01: qty 11

## 2024-05-01 MED ORDER — PALONOSETRON HCL INJECTION 0.25 MG/5ML
0.2500 mg | Freq: Once | INTRAVENOUS | Status: AC
  Administered 2024-05-01: 0.25 mg via INTRAVENOUS
  Filled 2024-05-01: qty 5

## 2024-05-01 MED ORDER — HEPARIN SOD (PORK) LOCK FLUSH 100 UNIT/ML IV SOLN
500.0000 [IU] | Freq: Once | INTRAVENOUS | Status: DC | PRN
  Filled 2024-05-01: qty 5

## 2024-05-02 ENCOUNTER — Inpatient Hospital Stay

## 2024-05-02 VITALS — BP 112/71 | HR 96 | Temp 99.5°F | Resp 17

## 2024-05-02 DIAGNOSIS — Z5112 Encounter for antineoplastic immunotherapy: Secondary | ICD-10-CM | POA: Diagnosis not present

## 2024-05-02 DIAGNOSIS — C8333 Diffuse large B-cell lymphoma, intra-abdominal lymph nodes: Secondary | ICD-10-CM

## 2024-05-02 MED ORDER — VINCRISTINE SULFATE CHEMO INJECTION 1 MG/ML
Freq: Once | INTRAVENOUS | Status: AC
  Filled 2024-05-02: qty 11

## 2024-05-03 ENCOUNTER — Inpatient Hospital Stay

## 2024-05-03 VITALS — BP 126/82 | HR 108 | Temp 99.8°F | Resp 18

## 2024-05-03 DIAGNOSIS — Z5112 Encounter for antineoplastic immunotherapy: Secondary | ICD-10-CM | POA: Diagnosis not present

## 2024-05-03 DIAGNOSIS — C8333 Diffuse large B-cell lymphoma, intra-abdominal lymph nodes: Secondary | ICD-10-CM

## 2024-05-03 MED ORDER — SODIUM CHLORIDE 0.9 % IV SOLN
INTRAVENOUS | Status: DC
  Filled 2024-05-03: qty 250

## 2024-05-03 MED ORDER — SODIUM CHLORIDE 0.9% FLUSH
10.0000 mL | INTRAVENOUS | Status: DC | PRN
  Administered 2024-05-03: 10 mL
  Filled 2024-05-03: qty 10

## 2024-05-03 MED ORDER — PALONOSETRON HCL INJECTION 0.25 MG/5ML
0.2500 mg | Freq: Once | INTRAVENOUS | Status: AC
  Administered 2024-05-03: 0.25 mg via INTRAVENOUS
  Filled 2024-05-03: qty 5

## 2024-05-03 MED ORDER — SODIUM CHLORIDE 0.9 % IV SOLN
900.0000 mg/m2 | Freq: Once | INTRAVENOUS | Status: AC
  Administered 2024-05-03: 1500 mg via INTRAVENOUS
  Filled 2024-05-03: qty 75

## 2024-05-03 MED ORDER — HEPARIN SOD (PORK) LOCK FLUSH 100 UNIT/ML IV SOLN
500.0000 [IU] | Freq: Once | INTRAVENOUS | Status: AC | PRN
  Administered 2024-05-03: 500 [IU]
  Filled 2024-05-03: qty 5

## 2024-05-06 ENCOUNTER — Encounter: Payer: Self-pay | Admitting: Oncology

## 2024-05-06 ENCOUNTER — Inpatient Hospital Stay

## 2024-05-06 DIAGNOSIS — C8333 Diffuse large B-cell lymphoma, intra-abdominal lymph nodes: Secondary | ICD-10-CM

## 2024-05-06 DIAGNOSIS — Z5112 Encounter for antineoplastic immunotherapy: Secondary | ICD-10-CM | POA: Diagnosis not present

## 2024-05-06 MED ORDER — PEGFILGRASTIM-CBQV 6 MG/0.6ML ~~LOC~~ SOSY
6.0000 mg | PREFILLED_SYRINGE | Freq: Once | SUBCUTANEOUS | Status: AC
  Administered 2024-05-06: 6 mg via SUBCUTANEOUS
  Filled 2024-05-06: qty 0.6

## 2024-05-16 ENCOUNTER — Encounter: Payer: Self-pay | Admitting: Oncology

## 2024-05-20 ENCOUNTER — Ambulatory Visit
Admission: RE | Admit: 2024-05-20 | Discharge: 2024-05-20 | Disposition: A | Discharge status: Home / Self Care | Source: Ambulatory Visit | Attending: Oncology | Admitting: Oncology

## 2024-05-20 DIAGNOSIS — I2510 Atherosclerotic heart disease of native coronary artery without angina pectoris: Secondary | ICD-10-CM | POA: Insufficient documentation

## 2024-05-20 DIAGNOSIS — I700 Atherosclerosis of aorta: Secondary | ICD-10-CM | POA: Diagnosis not present

## 2024-05-20 DIAGNOSIS — C8590 Non-Hodgkin lymphoma, unspecified, unspecified site: Secondary | ICD-10-CM | POA: Insufficient documentation

## 2024-05-20 DIAGNOSIS — R933 Abnormal findings on diagnostic imaging of other parts of digestive tract: Secondary | ICD-10-CM | POA: Insufficient documentation

## 2024-05-20 DIAGNOSIS — Z5111 Encounter for antineoplastic chemotherapy: Secondary | ICD-10-CM

## 2024-05-20 LAB — GLUCOSE, CAPILLARY: Glucose-Capillary: 87 mg/dL (ref 70–99)

## 2024-05-20 MED ORDER — FLUDEOXYGLUCOSE F - 18 (FDG) INJECTION
9.0000 | Freq: Once | INTRAVENOUS | Status: AC | PRN
Start: 2024-05-20 — End: 2024-05-20
  Administered 2024-05-20: 9.62 via INTRAVENOUS

## 2024-05-25 ENCOUNTER — Other Ambulatory Visit: Payer: Self-pay

## 2024-06-04 ENCOUNTER — Encounter: Payer: Self-pay | Admitting: Oncology

## 2024-06-04 ENCOUNTER — Encounter: Payer: Self-pay | Admitting: *Deleted

## 2024-06-04 ENCOUNTER — Inpatient Hospital Stay: Attending: Oncology

## 2024-06-04 ENCOUNTER — Inpatient Hospital Stay (HOSPITAL_BASED_OUTPATIENT_CLINIC_OR_DEPARTMENT_OTHER): Admitting: Oncology

## 2024-06-04 VITALS — BP 114/70 | HR 87 | Temp 98.1°F | Resp 18 | Ht 65.0 in | Wt 186.5 lb

## 2024-06-04 DIAGNOSIS — Z08 Encounter for follow-up examination after completed treatment for malignant neoplasm: Secondary | ICD-10-CM

## 2024-06-04 DIAGNOSIS — Z95828 Presence of other vascular implants and grafts: Secondary | ICD-10-CM

## 2024-06-04 DIAGNOSIS — C8333 Diffuse large B-cell lymphoma, intra-abdominal lymph nodes: Secondary | ICD-10-CM

## 2024-06-04 DIAGNOSIS — I517 Cardiomegaly: Secondary | ICD-10-CM | POA: Insufficient documentation

## 2024-06-04 DIAGNOSIS — Z8579 Personal history of other malignant neoplasms of lymphoid, hematopoietic and related tissues: Secondary | ICD-10-CM | POA: Diagnosis not present

## 2024-06-04 DIAGNOSIS — I2510 Atherosclerotic heart disease of native coronary artery without angina pectoris: Secondary | ICD-10-CM | POA: Insufficient documentation

## 2024-06-04 DIAGNOSIS — Z87891 Personal history of nicotine dependence: Secondary | ICD-10-CM | POA: Insufficient documentation

## 2024-06-04 DIAGNOSIS — Z79899 Other long term (current) drug therapy: Secondary | ICD-10-CM | POA: Diagnosis not present

## 2024-06-04 DIAGNOSIS — Z809 Family history of malignant neoplasm, unspecified: Secondary | ICD-10-CM | POA: Insufficient documentation

## 2024-06-04 DIAGNOSIS — Z800 Family history of malignant neoplasm of digestive organs: Secondary | ICD-10-CM | POA: Diagnosis not present

## 2024-06-04 DIAGNOSIS — I700 Atherosclerosis of aorta: Secondary | ICD-10-CM | POA: Diagnosis not present

## 2024-06-04 DIAGNOSIS — C8338 Diffuse large B-cell lymphoma, lymph nodes of multiple sites: Secondary | ICD-10-CM | POA: Diagnosis present

## 2024-06-04 DIAGNOSIS — C83398 Diffuse large b-cell lymphoma of other extranodal and solid organ sites: Secondary | ICD-10-CM | POA: Insufficient documentation

## 2024-06-04 LAB — CBC WITH DIFFERENTIAL (CANCER CENTER ONLY)
Abs Immature Granulocytes: 0.09 K/uL — ABNORMAL HIGH (ref 0.00–0.07)
Basophils Absolute: 0.1 K/uL (ref 0.0–0.1)
Basophils Relative: 1 %
Eosinophils Absolute: 0.2 K/uL (ref 0.0–0.5)
Eosinophils Relative: 2 %
HCT: 35.2 % — ABNORMAL LOW (ref 39.0–52.0)
Hemoglobin: 11.9 g/dL — ABNORMAL LOW (ref 13.0–17.0)
Immature Granulocytes: 1 %
Lymphocytes Relative: 10 %
Lymphs Abs: 0.9 K/uL (ref 0.7–4.0)
MCH: 30.1 pg (ref 26.0–34.0)
MCHC: 33.8 g/dL (ref 30.0–36.0)
MCV: 88.9 fL (ref 80.0–100.0)
Monocytes Absolute: 0.7 K/uL (ref 0.1–1.0)
Monocytes Relative: 8 %
Neutro Abs: 7 K/uL (ref 1.7–7.7)
Neutrophils Relative %: 78 %
Platelet Count: 211 K/uL (ref 150–400)
RBC: 3.96 MIL/uL — ABNORMAL LOW (ref 4.22–5.81)
RDW: 14.4 % (ref 11.5–15.5)
WBC Count: 8.9 K/uL (ref 4.0–10.5)
nRBC: 0 % (ref 0.0–0.2)

## 2024-06-04 LAB — CMP (CANCER CENTER ONLY)
ALT: 25 U/L (ref 0–44)
AST: 29 U/L (ref 15–41)
Albumin: 4.2 g/dL (ref 3.5–5.0)
Alkaline Phosphatase: 68 U/L (ref 38–126)
Anion gap: 8 (ref 5–15)
BUN: 15 mg/dL (ref 8–23)
CO2: 23 mmol/L (ref 22–32)
Calcium: 8.8 mg/dL — ABNORMAL LOW (ref 8.9–10.3)
Chloride: 107 mmol/L (ref 98–111)
Creatinine: 1.14 mg/dL (ref 0.61–1.24)
GFR, Estimated: >60 mL/min (ref >60)
Glucose, Bld: 117 mg/dL — ABNORMAL HIGH (ref 70–99)
Potassium: 3.9 mmol/L (ref 3.5–5.1)
Sodium: 138 mmol/L (ref 135–145)
Total Bilirubin: 0.7 mg/dL (ref 0.0–1.2)
Total Protein: 6.5 g/dL (ref 6.5–8.1)

## 2024-06-04 MED ORDER — LIDOCAINE-PRILOCAINE 2.5-2.5 % EX CREA: 1.0000 | TOPICAL_CREAM | CUTANEOUS | 2 refills | Status: AC | PRN

## 2024-06-04 MED ORDER — SODIUM CHLORIDE 0.9% FLUSH
10.0000 mL | Freq: Once | INTRAVENOUS | Status: AC
  Administered 2024-06-04: 10 mL via INTRAVENOUS
  Filled 2024-06-04: qty 10

## 2024-06-04 MED ORDER — HEPARIN SOD (PORK) LOCK FLUSH 100 UNIT/ML IV SOLN
500.0000 [IU] | Freq: Once | INTRAVENOUS | Status: AC
  Administered 2024-06-04: 500 [IU] via INTRAVENOUS
  Filled 2024-06-04: qty 5

## 2024-06-04 NOTE — Patient Instructions (Signed)
 Clonoseq order for [facility]D tracking submitted online, lab drawn from port by Nena Pollack, RN and taken to lab for transport to [employer] pick up.

## 2024-06-04 NOTE — Progress Notes (Signed)
 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  He had a CT abdomen and pelvis with contrast which showed large soft tissue mass along the mesentery measuring up to 21 cm with encasement of central mesenteric vessels and loss of tissue planes between the mass and several bowel loops.  Caliber change along the third portion of duodenum with partial mild obstruction.  Findings concerning for lymphoma.  Serum creatinine mildly elevated at 1.4.  Spleen slightly enlarged without intrinsic splenic mass.  Patient denies any significant nausea or vomiting.  He has early satiety   PET CT scan from 12/22/2023 showed Hypermetabolic left supraclavicular mediastinal retroperitoneal nodes along with large central mesenteric nodal mass consistent with lymphoma.  SUV in these areas ranging around SUV 16.  Central mesenteric nodal mass extending into the upper pelvis.  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  Cells positive for CD20 with coexpression of CD10 and BCL6 consistent with germinal center/folic killer phenotype and aberrantly express Bcl-2 strongly.  MOM1 and CD30  are negative.  EBV ISH negative.  Proliferation ranges from 20 to 80% with overall average of 60 to 70%.  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       Patient received Pola R CHP chemotherapy for cycle 1.  Double hit testing showed make gene rearrangement at 75%. Although Bcl-2 and BCL6 rearrangement was detected on FISH these were not known partners to make and therefore it was reported as a pseudo double hit.  However given the fact that this is a transformed diffuse large B-cell lymphoma from a pre-existing follicular lymphoma this would fit the pattern of a double hit lymphoma/ high grade B cell lymphoma.  Plan was therefore made to switch him to dose adjusted R-EPOCH chemotherapy for the remainder of the cycles   Initially plan was not to offer CNS prophylaxis however given that his double hit lymphoma with an intermediate risk score of 3 intrathecal methotrexate  will be given if tolerated for 4 cycles.  Patient went through 2 cycles of intrathecal methotrexate  but following that it was stopped due to significant headache  Patient completed 6 cycles of dose adjusted R-EPOCH chemotherapy as an outpatient on 04/29/2024 and tolerated it without any significant side effects  Interval history-patient has occasional pain in his legs but is otherwise almost feeling back to his baseline.  Denies any specific complaints at this time.  History obtained with the help of Spanish interpreter  ECOG PS- 0 Pain scale- 0   Review of systems- Review of Systems  Constitutional:  Negative for chills, fever, malaise/fatigue and weight loss.  HENT:  Negative for congestion, ear discharge and nosebleeds.   Eyes:  Negative for blurred vision.  Respiratory:  Negative for cough, hemoptysis, sputum production, shortness of  breath and wheezing.   Cardiovascular:  Negative for chest pain, palpitations, orthopnea and claudication.  Gastrointestinal:  Negative for abdominal pain, blood in stool, constipation, diarrhea, heartburn, melena, nausea and vomiting.  Genitourinary:  Negative for dysuria, flank pain, frequency, hematuria and urgency.  Musculoskeletal:  Negative for back pain, joint pain and myalgias.  Skin:  Negative for rash.  Neurological:  Negative for dizziness, tingling, focal weakness, seizures, weakness and headaches.  Endo/Heme/Allergies:  Does not bruise/bleed easily.  Psychiatric/Behavioral:  Negative for depression and suicidal ideas. The patient does not have insomnia.       No Known Allergies   No past medical history on file.   Past Surgical History:  Procedure Laterality Date   FISTULOTOMY N/A 01/10/2018   Procedure: FISTULOTOMY;  Surgeon: Rodolph Romano, MD;  Location: [facility] ORS;  Service: General;  Laterality: N/A;   MASS BIOPSY Left 12/28/2023   Procedure: NECK MASS BIOPSY;  Surgeon: Jordis Laneta FALCON, MD;  Location: [facility] ORS;  Service: General;  Laterality: Left;   PORTACATH PLACEMENT N/A 12/28/2023   Procedure: INSERTION PORT-A-CATH;  Surgeon: Jordis Laneta FALCON, MD;  Location: [facility] ORS;  Service: General;  Laterality: N/A;    Social History   Socioeconomic History   Marital status: Single    Spouse name: Not on file   Number of children: 4   Years of education: Not on file   Highest education level: Not on file  Occupational History   Not on file  Tobacco Use   Smoking status: Former    Types: Cigarettes    Passive exposure: Past   Smokeless tobacco: Never  Vaping Use   Vaping status: Never Used  Substance and Sexual Activity   Alcohol use: Not Currently   Drug use: Not Currently    Types: Cocaine    Comment: years ago    Sexual activity: Yes  Other Topics Concern   Not on file  Social History Narrative   Not on file   Social Drivers of Health    Financial Resource Strain: Not on file  Food Insecurity: No Food Insecurity (12/27/2023)   Hunger Vital Sign    Worried About Running Out of Food in the Last Year: Never true    Ran Out of Food in the Last Year: Never true  Transportation Needs: No Transportation Needs (12/27/2023)   PRAPARE - Administrator, Civil Service (Medical): No    Lack of Transportation (Non-Medical): No  Physical Activity: Inactive (12/27/2023)   Exercise Vital Sign    Days of Exercise per Week: 0 days    Minutes of Exercise per Session: 0 min  Stress: Not on file  Social Connections: Not on file  Intimate Partner Violence: Not At Risk (12/27/2023)   Humiliation, Afraid, Rape, and Kick questionnaire    Fear of Current or Ex-Partner: No    Emotionally Abused: No    Physically Abused: No    Sexually Abused: No    Family History  Problem Relation Age of Onset   Colon cancer Mother    Pancreatic cancer Father    Cancer Sister      Current Outpatient Medications:    acyclovir  (ZOVIRAX ) 400 MG tablet, Take 1 tablet (400 mg total) by mouth daily., Disp: 30 tablet, Rfl: 0  allopurinol  (ZYLOPRIM ) 300 MG tablet, Take 300 mg by mouth daily., Disp: , Rfl:    HYDROcodone -acetaminophen  (NORCO/VICODIN) 5-325 MG tablet, Take 1 tablet by mouth every 6 (six) hours as needed for moderate pain (pain score 4-6). (Patient not taking: Reported on 03/01/2024), Disp: 15 tablet, Rfl: 0   pantoprazole  (PROTONIX ) 20 MG tablet, Take 1 tablet (20 mg total) by mouth daily. Take 1 tablet (20 mg total) by mouth every morning., Disp: 30 tablet, Rfl: 2   sulfamethoxazole -trimethoprim  (BACTRIM  DS) 800-160 MG tablet, Take 1 tablet by mouth 2 (two) times daily., Disp: , Rfl:  No current facility-administered medications for this visit.  Facility-Administered Medications Ordered in Other Visits:    0.9 %  sodium chloride  infusion, , Intravenous, Continuous, Melanee Annah BROCKS, MD, Stopped at 01/29/24 1143   0.9 %  sodium chloride   infusion, , Intravenous, Continuous, Melanee Annah BROCKS, MD, Stopped at 03/11/24 1228   0.9 %  sodium chloride  infusion, , Intravenous, Continuous, Melanee Annah BROCKS, MD, Stopped at 04/01/24 1156   methotrexate  (PF) 12 mg, hydrocortisone  sodium succinate (SOLU-CORTEF ) 50 mg in sodium chloride  (PF) 0.9 % INTRATHECAL chemo injection, , Intrathecal, Once, Melanee Annah BROCKS, MD   sodium chloride  flush (NS) 0.9 % injection 10 mL, 10 mL, Intracatheter, PRN, Melanee Annah BROCKS, MD, 10 mL at 01/30/24 1254   sodium chloride  flush (NS) 0.9 % injection 10 mL, 10 mL, Intracatheter, PRN, Melanee Annah BROCKS, MD, 10 mL at 04/03/24 1327  Physical exam:  Vitals:   06/04/24 1333  BP: 114/70  Pulse: 87  Resp: 18  Temp: 98.1 F (36.7 C)  TempSrc: Tympanic  SpO2: 99%  Weight: 186 lb 8 oz (84.6 kg)  Height: 5' 5 (1.651 m)   Physical Exam Cardiovascular:     Rate and Rhythm: Normal rate and regular rhythm.     Heart sounds: Normal heart sounds.  Pulmonary:     Effort: Pulmonary effort is normal.     Breath sounds: Normal breath sounds.  Skin:    General: Skin is warm and dry.  Neurological:     Mental Status: He is alert and oriented to person, place, and time.      I have personally reviewed labs listed below:    Latest Ref Rng & Units 04/29/2024    8:35 AM  CMP  Glucose 70 - 99 mg/dL 866   BUN 8 - 23 mg/dL 13   Creatinine 9.38 - 1.24 mg/dL 9.18   Sodium 864 - 854 mmol/L 136   Potassium 3.5 - 5.1 mmol/L 3.8   Chloride 98 - 111 mmol/L 105   CO2 22 - 32 mmol/L 20   Calcium 8.9 - 10.3 mg/dL 8.2   Total Protein 6.5 - 8.1 g/dL 6.5   Total Bilirubin 0.0 - 1.2 mg/dL 0.5   Alkaline Phos 38 - 126 U/L 70   AST 15 - 41 U/L 30   ALT 0 - 44 U/L 20       Latest Ref Rng & Units 04/29/2024    8:35 AM  CBC  WBC 4.0 - 10.5 K/uL 6.6   Hemoglobin 13.0 - 17.0 g/dL 88.8   Hematocrit 60.9 - 52.0 % 33.4   Platelets 150 - 400 K/uL 215    I have personally reviewed Radiology images listed below: No images are attached to  the encounter.  NM PET Image Restag (PS) Skull Base To Thigh Result Date: 05/26/2024 CLINICAL DATA:  Subsequent treatment strategy for non-Hodgkin lymphoma. EXAM: NUCLEAR MEDICINE PET SKULL  BASE TO THIGH TECHNIQUE: 9.6 mCi F-18 FDG was injected intravenously. Full-ring PET imaging was performed from the skull base to thigh after the radiotracer. CT data was obtained and used for attenuation correction and anatomic localization. Fasting blood glucose: 87 mg/dl COMPARISON:  95/97/7974. FINDINGS: Mediastinal blood pool activity: SUV max 2.4 Liver activity: SUV max 3.2 NECK: No abnormal hypermetabolism. Incidental CT findings: None. CHEST: No abnormal hypermetabolism. Incidental CT findings: Left IJ Port-A-Cath terminates in the right atrium. Atherosclerotic calcification of the aorta, aortic valve and coronary arteries. Heart is enlarged. No pericardial or pleural effusion. ABDOMEN/PELVIS: Focal hypermetabolism within the low midline small bowel, SUV max, 6.7, without a clear CT correlate. Ill-defined and infiltrative abdominal retroperitoneal and small bowel mesenteric soft tissue does not show abnormal hypermetabolism. Index matted soft tissue in the small bowel mesentery measures approximately 4.0 x 9.6 cm, SUV max 2.3, as before. Incidental CT findings: None. SKELETON: No abnormal hypermetabolism. Incidental CT findings: Degenerative changes in the spine. IMPRESSION: 1. Hypometabolic matted soft tissue in the small bowel mesentery and ill-defined soft tissue thickening in the abdominal retroperitoneum, as before (Deauville 2). 2. Focal hypermetabolism within the low midline small bowel, without a definitive CT correlate. Consider CT enterography in further evaluation, as clinically indicated. 3. Aortic atherosclerosis (ICD10-I70.0). Coronary artery calcification. Electronically Signed   By: Newell Eke M.D.   On: 05/26/2024 15:16     Assessment and plan- Patient is a 63 y.o. male with history of stage IV  diffuse large B-cell lymphoma double hit GCB subtype. He is s/p post 1 cycle of Pola R CHP chemotherapy and was subsequently switched to dose adjusted R-EPOCH chemotherapy.  He completed total 6 cycles of chemotherapy and is here to discuss PET scan Results and further management  I have reviewed PET CT scan images independently and discussed findings with the patient which shows excellent response to treatment.  He has residual soft tissue in the small bowel mesentery measuring 4 x 9.6 cm with an SUV of 2.3 and this was deemed to be a Deauville 2.  He does not require any biopsy of this mass given the low Deauville score.  I am planning to repeat Clonoseq testing at this time and his interim testing after 3 cycles was also negative.  I will repeat PET scan again in 3 months time.  His present PET scan showed focal hypermetabolic in his small bowel without a definitive CT correlate.  I will follow-up on these findings with a repeat CT in 3 months and if these findings persist I will consider getting a CT enterography at that time.  As such the patient is in CR after 6 cycles of dose adjusted R-EPOCH chemotherapy and does not require any further treatment at this time  Other incidental findings on PET scan include enlarged heart.  I have explained to the patient that PET scan is not an ideal test to assess the heart.Echocardiogram prior to starting chemotherapy showed a normal EF with no wall motion abnormality.  As long as patient is asymptomatic and is able to carry on his ADLs IADLs and exercise regularly I will hold off on any further echocardiogram assessment at this time.  He was also found to have aortic atherosclerosis.  This would be nonspecific and he can follow-up with his primary care provider for the same.  Patient will continue to keep his port in place which will be flushed every 6 to 8 weeks.  He will consider getting it out after his repeat  PET scan in 3 months   Visit Diagnosis 1. Encounter  for follow-up surveillance of diffuse large B-cell lymphoma      Dr. Annah Skene, MD, MPH [facility] [phone] 06/04/2024 12:11 PM

## 2024-06-05 ENCOUNTER — Other Ambulatory Visit: Payer: Self-pay

## 2024-06-07 ENCOUNTER — Encounter: Payer: Self-pay | Admitting: Oncology

## 2024-06-07 ENCOUNTER — Other Ambulatory Visit: Payer: Self-pay | Admitting: *Deleted

## 2024-06-07 DIAGNOSIS — C8333 Diffuse large B-cell lymphoma, intra-abdominal lymph nodes: Secondary | ICD-10-CM

## 2024-06-08 ENCOUNTER — Other Ambulatory Visit: Payer: Self-pay

## 2024-06-10 ENCOUNTER — Inpatient Hospital Stay

## 2024-06-10 DIAGNOSIS — C8333 Diffuse large B-cell lymphoma, intra-abdominal lymph nodes: Secondary | ICD-10-CM

## 2024-06-10 LAB — GENETIC SCREENING ORDER

## 2024-06-12 ENCOUNTER — Telehealth: Payer: Self-pay | Admitting: *Deleted

## 2024-06-12 NOTE — Telephone Encounter (Signed)
 There was a collection of specimen and it was not clear because the dates and times did not go together.  The patient was called back on July 14 and collected again.  I called back and talked to a person there and all they needed was the 7/14.

## 2024-06-21 ENCOUNTER — Telehealth: Payer: Self-pay

## 2024-06-21 NOTE — Telephone Encounter (Signed)
 William Mathews came to clinic requesting  a letter saying he can go back to work. He mentioned that Dr. Melanee would work on it before she left. Patient requested a call back on Monday to [phone] once letter is prepared.  Email sent to Dr. Melanee requesting feedback.

## 2024-06-24 ENCOUNTER — Encounter: Payer: Self-pay | Admitting: Oncology

## 2024-06-24 NOTE — Telephone Encounter (Signed)
 Spoke to patient; informed letter is downstairs ready for pick up.  No additional questions / concerns at this time.

## 2024-07-16 ENCOUNTER — Inpatient Hospital Stay: Attending: Oncology

## 2024-09-03 ENCOUNTER — Ambulatory Visit
Admission: RE | Admit: 2024-09-03 | Discharge: 2024-09-03 | Disposition: A | Discharge status: Home / Self Care | Source: Ambulatory Visit | Attending: Oncology | Admitting: Oncology

## 2024-09-03 DIAGNOSIS — C8333 Diffuse large B-cell lymphoma, intra-abdominal lymph nodes: Secondary | ICD-10-CM | POA: Diagnosis present

## 2024-09-03 DIAGNOSIS — Z8579 Personal history of other malignant neoplasms of lymphoid, hematopoietic and related tissues: Secondary | ICD-10-CM

## 2024-09-03 LAB — GLUCOSE, CAPILLARY: Glucose-Capillary: 96 mg/dL (ref 70–99)

## 2024-09-03 MED ORDER — FLUDEOXYGLUCOSE F - 18 (FDG) INJECTION
9.6000 | Freq: Once | INTRAVENOUS | Status: AC | PRN
  Administered 2024-09-03: 10.29 via INTRAVENOUS

## 2024-09-18 ENCOUNTER — Encounter: Payer: Self-pay | Admitting: Oncology

## 2024-09-18 ENCOUNTER — Inpatient Hospital Stay: Attending: Oncology

## 2024-09-18 ENCOUNTER — Telehealth: Payer: Self-pay

## 2024-09-18 ENCOUNTER — Inpatient Hospital Stay (HOSPITAL_BASED_OUTPATIENT_CLINIC_OR_DEPARTMENT_OTHER): Admitting: Oncology

## 2024-09-18 VITALS — BP 103/79 | HR 94 | Temp 97.8°F | Resp 19 | Ht 65.0 in | Wt 175.9 lb

## 2024-09-18 DIAGNOSIS — Z809 Family history of malignant neoplasm, unspecified: Secondary | ICD-10-CM | POA: Insufficient documentation

## 2024-09-18 DIAGNOSIS — Z800 Family history of malignant neoplasm of digestive organs: Secondary | ICD-10-CM | POA: Diagnosis not present

## 2024-09-18 DIAGNOSIS — Z79899 Other long term (current) drug therapy: Secondary | ICD-10-CM | POA: Diagnosis not present

## 2024-09-18 DIAGNOSIS — Z79624 Long term (current) use of inhibitors of nucleotide synthesis: Secondary | ICD-10-CM | POA: Diagnosis not present

## 2024-09-18 DIAGNOSIS — Z79631 Long term (current) use of antimetabolite agent: Secondary | ICD-10-CM | POA: Insufficient documentation

## 2024-09-18 DIAGNOSIS — Z8579 Personal history of other malignant neoplasms of lymphoid, hematopoietic and related tissues: Secondary | ICD-10-CM

## 2024-09-18 DIAGNOSIS — Z87891 Personal history of nicotine dependence: Secondary | ICD-10-CM | POA: Diagnosis not present

## 2024-09-18 DIAGNOSIS — C8333 Diffuse large B-cell lymphoma, intra-abdominal lymph nodes: Secondary | ICD-10-CM

## 2024-09-18 DIAGNOSIS — C833A Diffuse large b-cell lymphoma, in remission: Secondary | ICD-10-CM | POA: Insufficient documentation

## 2024-09-18 DIAGNOSIS — Z95828 Presence of other vascular implants and grafts: Secondary | ICD-10-CM

## 2024-09-18 LAB — CBC WITH DIFFERENTIAL (CANCER CENTER ONLY)
Abs Immature Granulocytes: 0.03 K/uL (ref 0.00–0.07)
Basophils Absolute: 0 K/uL (ref 0.0–0.1)
Basophils Relative: 1 %
Eosinophils Absolute: 0 K/uL (ref 0.0–0.5)
Eosinophils Relative: 1 %
HCT: 39 % (ref 39.0–52.0)
Hemoglobin: 13.6 g/dL (ref 13.0–17.0)
Immature Granulocytes: 1 %
Lymphocytes Relative: 25 %
Lymphs Abs: 1.1 K/uL (ref 0.7–4.0)
MCH: 28.2 pg (ref 26.0–34.0)
MCHC: 34.9 g/dL (ref 30.0–36.0)
MCV: 80.7 fL (ref 80.0–100.0)
Monocytes Absolute: 0.8 K/uL (ref 0.1–1.0)
Monocytes Relative: 18 %
Neutro Abs: 2.4 K/uL (ref 1.7–7.7)
Neutrophils Relative %: 54 %
Platelet Count: 202 K/uL (ref 150–400)
RBC: 4.83 MIL/uL (ref 4.22–5.81)
RDW: 13.3 % (ref 11.5–15.5)
WBC Count: 4.4 K/uL (ref 4.0–10.5)
nRBC: 0 % (ref 0.0–0.2)

## 2024-09-18 LAB — CMP (CANCER CENTER ONLY)
ALT: 25 U/L (ref 0–44)
AST: 27 U/L (ref 15–41)
Albumin: 4.2 g/dL (ref 3.5–5.0)
Alkaline Phosphatase: 74 U/L (ref 38–126)
Anion gap: 9 (ref 5–15)
BUN: 15 mg/dL (ref 8–23)
CO2: 22 mmol/L (ref 22–32)
Calcium: 8.8 mg/dL — ABNORMAL LOW (ref 8.9–10.3)
Chloride: 103 mmol/L (ref 98–111)
Creatinine: 0.95 mg/dL (ref 0.61–1.24)
GFR, Estimated: >60 mL/min (ref >60)
Glucose, Bld: 102 mg/dL — ABNORMAL HIGH (ref 70–99)
Potassium: 4 mmol/L (ref 3.5–5.1)
Sodium: 134 mmol/L — ABNORMAL LOW (ref 135–145)
Total Bilirubin: 0.7 mg/dL (ref 0.0–1.2)
Total Protein: 6.9 g/dL (ref 6.5–8.1)

## 2024-09-18 LAB — LACTATE DEHYDROGENASE: LDH: 145 U/L (ref 98–192)

## 2024-09-18 NOTE — Progress Notes (Signed)
 Patient doing well with no new or acute concerns.

## 2024-09-18 NOTE — Progress Notes (Signed)
 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  He had a CT abdomen and pelvis with contrast which showed large soft tissue mass along the mesentery measuring up to 21 cm with encasement of central mesenteric vessels and loss of tissue planes between the mass and several bowel loops.  Caliber change along the third portion of duodenum with partial mild obstruction.  Findings concerning for lymphoma.  Serum creatinine mildly elevated at 1.4.  Spleen slightly enlarged without intrinsic splenic mass.  Patient denies any significant nausea or vomiting.  He has early satiety   PET CT scan from 12/22/2023 showed Hypermetabolic left supraclavicular mediastinal retroperitoneal nodes along with large central mesenteric nodal mass consistent with lymphoma.  SUV in these areas ranging around SUV 16.  Central mesenteric nodal mass extending into the upper pelvis.  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  Cells positive for CD20 with coexpression of CD10 and BCL6 consistent with germinal  center/folic killer phenotype and aberrantly express Bcl-2 strongly.  MOM1 and CD30 are negative.  EBV ISH negative.  Proliferation ranges from 20 to 80% with overall average of 60 to 70%.  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       Patient received Pola R CHP chemotherapy for cycle 1.  Double hit testing showed make gene rearrangement at 75%. Although Bcl-2 and BCL6 rearrangement was detected on FISH these were not known partners to make and therefore it was reported as a pseudo double hit.  However given the fact that this is a transformed diffuse large B-cell lymphoma from a pre-existing follicular lymphoma this would fit the pattern of a double hit lymphoma/ high grade B cell lymphoma.  Plan was therefore made to switch him to dose adjusted R-EPOCH chemotherapy for the remainder of the cycles   Initially plan was not to offer CNS prophylaxis however given that his double hit lymphoma with an intermediate risk score of 3 intrathecal methotrexate  will be given if tolerated for 4 cycles.  Patient went through 2 cycles of intrathecal methotrexate  but following that it was stopped due to significant headache   Patient completed 6 cycles of dose adjusted R-EPOCH chemotherapy as an outpatient on 04/29/2024 and tolerated it without any significant side effects. PET CT scan after 6 cycles showed hypermetabolic matted soft tissue mass in the small bowel mesentery Deauville 2.  Focal hypermetabolic send in the low midline small bowel without CT correlate.  Clono- sequencing testing was negative at the end of  treatment    Interval history-patient is doing well presently and denies any complaints at this time.  He feels back to his baseline and is active and independent of his ADLs and IADLs.  He has been lifting weights  and working on his bodybuilding again  ECOG PS- 0 Pain scale- 0   Review of systems- Review of Systems  Constitutional:  Negative for chills, fever, malaise/fatigue and weight loss.  HENT:  Negative for congestion, ear discharge and nosebleeds.   Eyes:  Negative for blurred vision.  Respiratory:  Negative for cough, hemoptysis, sputum production, shortness of breath and wheezing.   Cardiovascular:  Negative for chest pain, palpitations, orthopnea and claudication.  Gastrointestinal:  Negative for abdominal pain, blood in stool, constipation, diarrhea, heartburn, melena, nausea and vomiting.  Genitourinary:  Negative for dysuria, flank pain, frequency, hematuria and urgency.  Musculoskeletal:  Negative for back pain, joint pain and myalgias.  Skin:  Negative for rash.  Neurological:  Negative for dizziness, tingling, focal weakness, seizures, weakness and headaches.  Endo/Heme/Allergies:  Does not bruise/bleed easily.  Psychiatric/Behavioral:  Negative for depression and suicidal ideas. The patient does not have insomnia.       No Known Allergies   No past medical history on file.   Past Surgical History:  Procedure Laterality Date   FISTULOTOMY N/A 01/10/2018   Procedure: FISTULOTOMY;  Surgeon: Rodolph Romano, MD;  Location: [facility] ORS;  Service: General;  Laterality: N/A;   MASS BIOPSY Left 12/28/2023   Procedure: NECK MASS BIOPSY;  Surgeon: Jordis Laneta FALCON, MD;  Location: [facility] ORS;  Service: General;  Laterality: Left;   PORTACATH PLACEMENT N/A 12/28/2023   Procedure: INSERTION PORT-A-CATH;  Surgeon: Jordis Laneta FALCON, MD;  Location: [facility] ORS;  Service: General;  Laterality: N/A;    Social History   Socioeconomic History   Marital status: Single    Spouse name: Not on file   Number of children: 4   Years of education: Not on file   Highest education level: Not on file  Occupational History   Not on file  Tobacco Use   Smoking status: Former    Types: Cigarettes     Passive exposure: Past   Smokeless tobacco: Never  Vaping Use   Vaping status: Never Used  Substance and Sexual Activity   Alcohol use: Not Currently   Drug use: Not Currently    Types: Cocaine    Comment: years ago    Sexual activity: Yes  Other Topics Concern   Not on file  Social History Narrative   Not on file   Social Drivers of Health   Financial Resource Strain: Not on file  Food Insecurity: No Food Insecurity (12/27/2023)   Hunger Vital Sign    Worried About Running Out of Food in the Last Year: Never true    Ran Out of Food in the Last Year: Never true  Transportation Needs: No Transportation Needs (12/27/2023)   PRAPARE - Administrator, Civil Service (Medical): No    Lack of Transportation (Non-Medical): No  Physical Activity: Inactive (12/27/2023)   Exercise Vital Sign    Days of Exercise per Week: 0 days    Minutes of Exercise per Session: 0 min  Stress: Not on file  Social Connections: Not on file  Intimate Partner Violence: Not At Risk (12/27/2023)   Humiliation, Afraid, Rape, and Kick questionnaire    Fear of Current or Ex-Partner: No    Emotionally Abused: No    Physically Abused:  No    Sexually Abused: No    Family History  Problem Relation Age of Onset   Colon cancer Mother    Pancreatic cancer Father    Cancer Sister      Current Outpatient Medications:    acyclovir  (ZOVIRAX ) 400 MG tablet, Take 1 tablet (400 mg total) by mouth daily. (Patient not taking: Reported on 06/04/2024), Disp: 30 tablet, Rfl: 0   allopurinol  (ZYLOPRIM ) 300 MG tablet, Take 300 mg by mouth daily. (Patient not taking: Reported on 06/04/2024), Disp: , Rfl:    HYDROcodone -acetaminophen  (NORCO/VICODIN) 5-325 MG tablet, Take 1 tablet by mouth every 6 (six) hours as needed for moderate pain (pain score 4-6). (Patient not taking: Reported on 06/04/2024), Disp: 15 tablet, Rfl: 0   lidocaine -prilocaine  (EMLA ) cream, Apply 1 Application topically as needed., Disp: 30 g, Rfl: 2    pantoprazole  (PROTONIX ) 20 MG tablet, Take 1 tablet (20 mg total) by mouth daily. Take 1 tablet (20 mg total) by mouth every morning. (Patient not taking: Reported on 06/04/2024), Disp: 30 tablet, Rfl: 2   sulfamethoxazole -trimethoprim  (BACTRIM  DS) 800-160 MG tablet, Take 1 tablet by mouth 2 (two) times daily. (Patient not taking: Reported on 06/04/2024), Disp: , Rfl:  No current facility-administered medications for this visit.  Facility-Administered Medications Ordered in Other Visits:    0.9 %  sodium chloride  infusion, , Intravenous, Continuous, Melanee Annah BROCKS, MD, Stopped at 01/29/24 1143   0.9 %  sodium chloride  infusion, , Intravenous, Continuous, Melanee Annah BROCKS, MD, Stopped at 03/11/24 1228   0.9 %  sodium chloride  infusion, , Intravenous, Continuous, Melanee Annah BROCKS, MD, Stopped at 04/01/24 1156   methotrexate  (PF) 12 mg, hydrocortisone  sodium succinate (SOLU-CORTEF ) 50 mg in sodium chloride  (PF) 0.9 % INTRATHECAL chemo injection, , Intrathecal, Once, Melanee Annah BROCKS, MD   sodium chloride  flush (NS) 0.9 % injection 10 mL, 10 mL, Intracatheter, PRN, Melanee Annah BROCKS, MD, 10 mL at 01/30/24 1254   sodium chloride  flush (NS) 0.9 % injection 10 mL, 10 mL, Intracatheter, PRN, Melanee Annah BROCKS, MD, 10 mL at 04/03/24 1327  Physical exam: There were no vitals filed for this visit. Physical Exam Cardiovascular:     Rate and Rhythm: Normal rate and regular rhythm.     Heart sounds: Normal heart sounds.  Pulmonary:     Effort: Pulmonary effort is normal.     Breath sounds: Normal breath sounds.  Skin:    General: Skin is warm and dry.  Neurological:     Mental Status: He is alert and oriented to person, place, and time.      I have personally reviewed labs listed below:    Latest Ref Rng & Units 06/04/2024   12:58 PM  CMP  Glucose 70 - 99 mg/dL 882   BUN 8 - 23 mg/dL 15   Creatinine 9.38 - 1.24 mg/dL 8.85   Sodium 864 - 854 mmol/L 138   Potassium 3.5 - 5.1 mmol/L 3.9   Chloride 98 - 111 mmol/L  107   CO2 22 - 32 mmol/L 23   Calcium 8.9 - 10.3 mg/dL 8.8   Total Protein 6.5 - 8.1 g/dL 6.5   Total Bilirubin 0.0 - 1.2 mg/dL 0.7   Alkaline Phos 38 - 126 U/L 68   AST 15 - 41 U/L 29   ALT 0 - 44 U/L 25       Latest Ref Rng & Units 06/04/2024   12:58 PM  CBC  WBC 4.0 - 10.5 K/uL 8.9  Hemoglobin 13.0 - 17.0 g/dL 88.0   Hematocrit 60.9 - 52.0 % 35.2   Platelets 150 - 400 K/uL 211    I have personally reviewed Radiology images listed below: No images are attached to the encounter.  NM PET Image Restage (PS) Skull Base to Thigh (F-18 FDG) Result Date: 09/05/2024 EXAM: PET AND CT SKULL BASE TO MID THIGH 09/03/2024 10:49:09 AM TECHNIQUE: RADIOPHARMACEUTICAL: 10.29 mCi F-18 FDG Uptake time 60 minutes. Glucose level 96 mg/dl. PET imaging was acquired from the base of the skull to the mid thighs. Non-contrast enhanced computed tomography was obtained for attenuation correction and anatomic localization. COMPARISON: PET CT dated 05/20/2024. CLINICAL HISTORY: Diffuse large B cell lymphoma. Encounter for follow-up surveillance of diffuse large B-cell lymphoma. Diffuse large B-cell lymphoma of intra-abdominal lymph nodes. No recent surgery/biopsy. No antibiotics/steroids. Last Chemotherapy 04/29/24. Previous PETs: 02/28/24, 05/20/24. FINDINGS: HEAD AND NECK: No metabolically active cervical lymphadenopathy. CHEST: Port in the anterior chest wall with tip in distal SVC. No metabolically active pulmonary nodules. No metabolically active lymphadenopathy. ABDOMEN AND PELVIS: Matt of nodal tissue in the central mesentery measures 8.9 x 3.9 cm (image 106/series 6) compared to 11.2 x 4.9 cm on the prior study. This central mesenteric mass continues to have very low metabolic activity with SUVmax equal to 2.8 compared to SUVmax equal to 2.3 on the prior study. This low metabolic activity is equal to background blood pool activity (SUVmax equal to 2.7). Deauville category 2. No new sites of hypermetabolic tissue in the  abdomen or pelvis. The spleen is normal volume. Physiologic activity within the gastrointestinal and genitourinary systems. BONES AND SOFT TISSUE: No evidence of skeletal malignancy. No abnormal FDG activity localizes to the bones. No metabolically active aggressive osseous lesion. IMPRESSION: 1. Decreased size of the central mesenteric nodal mass (with persistently very low metabolic activity (SUVmax 2.8, Deauville 2) 2. No new hypermetabolic sites Electronically signed by: Norleen Boxer MD 09/05/2024 11:14 AM EDT RP Workstation: [id number]     Assessment and plan- Patient is a 63 y.o. 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  SUV uptake 2.8 equal to background blood pool activity Deauville category 2.  No other new hypermetabolic sites noted.He is presently in remission as far as his double hit DLBCL is concerned.  I did discuss his case with radiation oncology Dr. Camelia to see if there would be any role for involved field radiation to the residual mesenteric mass although he does not have any significant hypermetabolism over and about the surrounding blood pool.  Moreover his clonoseq testing that was done 3 months ago was negative.  Dr. Camelia would like to see him and patient is agreeable to meeting him to decide if he would like to pursue radiation or not  I will see him back in 3 months with CBC with differential CMP and LDH for clinical exam.  Plan to repeat CT chest abdomen and pelvis with contrast 6 months from now   Visit Diagnosis 1. Encounter for follow-up surveillance of diffuse large B-cell lymphoma      Dr. Annah Skene, MD, MPH [facility] [phone] 09/18/2024 10:57 AM

## 2024-09-18 NOTE — Telephone Encounter (Signed)
 Dr. Jordis indicated he will do the port removal.  In secure chat it was communicated to Shona Daring that port removal will be done same day patient comes, patient will probably be out of work that day and needs to stop ASA 5 days before if he is taking any. Shona was speaking with patient's daughter at time of chat.  No further follow up needed.

## 2024-09-19 ENCOUNTER — Other Ambulatory Visit: Payer: Self-pay

## 2024-09-23 ENCOUNTER — Ambulatory Visit: Admitting: Radiation Oncology

## 2024-09-25 ENCOUNTER — Ambulatory Visit
Admission: RE | Admit: 2024-09-25 | Discharge: 2024-09-25 | Disposition: A | Discharge status: Home / Self Care | Source: Ambulatory Visit | Attending: Radiation Oncology | Admitting: Radiation Oncology

## 2024-09-25 ENCOUNTER — Encounter: Payer: Self-pay | Admitting: Surgery

## 2024-09-25 ENCOUNTER — Ambulatory Visit: Admitting: Surgery

## 2024-09-25 ENCOUNTER — Encounter: Payer: Self-pay | Admitting: Oncology

## 2024-09-25 ENCOUNTER — Encounter: Payer: Self-pay | Admitting: Radiation Oncology

## 2024-09-25 VITALS — BP 128/83 | HR 85 | Temp 97.7°F | Wt 176.0 lb

## 2024-09-25 VITALS — BP 125/84 | HR 73 | Ht 64.0 in | Wt 174.0 lb

## 2024-09-25 DIAGNOSIS — C8333 Diffuse large B-cell lymphoma, intra-abdominal lymph nodes: Secondary | ICD-10-CM

## 2024-09-25 DIAGNOSIS — C8331 Diffuse large B-cell lymphoma, lymph nodes of head, face, and neck: Secondary | ICD-10-CM | POA: Diagnosis not present

## 2024-09-25 NOTE — Telephone Encounter (Signed)
 I called and spoke to his daughter.

## 2024-09-25 NOTE — Patient Instructions (Addendum)
 Today we have removed your Port in our office. Please see information below regarding this.  You are free to shower 09/26/24.  Do not scrub the area.  Your incision was closed with Dermabond.  It is best to keep it clean and dry, it will tolerate a brief shower, but do not soak it or apply any creams or lotions to the incisions.  The Dermabond should gradually flake off over time.  Keep it open to air so you can evaluate your incisions.  Dermabond assists the underlying sutures to keep your incision closed and protected from infection.  Should you develop some drainage from your incision, some drops of drainage would be okay but if it persists continue to put keep a dry dressing over it.   You have glue on your skin and sutures under the skin. The glue will come off on it's own in 10-14 days. You may shower normally until this occurs but do not submerge.  Please use Tylenol  or Ibuprofen  for pain as needed. You may use ice to the area 3-4 times today and tomorrow for any achiness.   you may continue your regular activities right away but if you are having pain while doing something, stop what you are doing and try this activity once again in 3 days. Please call our office with any questions or concerns prior to your appointment.     Implanted Port Removal Implanted port removal is a procedure to remove the port and catheter (port-a-cath) that is implanted under your skin. The port is a small disc under your skin that can be punctured with a needle. It is connected to a vein in your chest or neck by a small flexible tube (catheter). The port-a-cath is used for treatment through an IV tube and for taking blood samples. Your health care provider will remove the port-a-cath if: You no longer need it for treatment. It is not working properly. The area around it gets infected.  Tell a health care provider about: Any allergies you have. All medicines you are taking, including vitamins, herbs, eye  drops, creams, and over-the-counter medicines. Any problems you or family members have had with anesthetic medicines. Any blood disorders you have. Any surgeries you have had. Any medical conditions you have. Whether you are pregnant or may be pregnant. What are the risks? Generally, this is a safe procedure. However, problems may occur, including: Infection. Bleeding. Allergic reactions to anesthetic medicines. Damage to nerves or blood vessels.

## 2024-09-25 NOTE — Consult Note (Signed)
 NEW PATIENT EVALUATION  Name: William Mathews  MRN: [id number]  Date:   09/25/2024     DOB: [birth year]   This 63 y.o. male patient presents to the clinic for initial evaluation of abdominal presentation of large diffuse B-cell lymphoma.SABRA  REFERRING PHYSICIAN: No ref. provider found  CHIEF COMPLAINT: No chief complaint on file.   DIAGNOSIS: There were no encounter diagnoses.   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  He initially presented to the ER with abdominal pain and discomfort CT scan showed a large soft tissue mass along the mesentery measuring up to 21 cm.  He had encasing the central mesenteric vessels and loss of tissue planes.  PET scan also confirmed hypermetabolic activity in the left supraclavicular mediastinal retroperitoneal nodes along with a large central mesenteric nodal mass consistent with lymphoma.  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  Cells were positive for CD20 with coexpression of CD10 and BCL6 consistent with germinal cell.  Patient receivedPola R CHP chemotherapy for cycle 1.  And then was switched toR-EPOCH.  Patient also underwent 2 cycles of intrathecal methotrexate  although that was discontinued secondary to significant headaches.  He completed 6 cycles of chemotherapy.  Repeat PET CT scan at that time showed slightly hypermetabolic matted soft tissue mass in the small bowel mesentery a Deauville 2.  There was focal hypermetabolic in the lobe midline the small bowel without CT correlate.  Interesting the initial mass was also only barely hypermetabolic with an SUV max of 2.8 which was the same in his repeat PET CT scan.  He is asymptomatic this time specific  denies fever chills night sweats weight loss abdominal pain or discomfort.  He is seen today for consideration of involved field treatment.  PLANNED TREATMENT REGIMEN: Recommendation for involved field radiation to his abdominal residual mass  PAST MEDICAL HISTORY:  has no past medical history on file.    PAST SURGICAL HISTORY:  Past Surgical History:  Procedure Laterality Date   FISTULOTOMY N/A 01/10/2018   Procedure: FISTULOTOMY;  Surgeon: Rodolph Romano, MD;  Location: [facility] ORS;  Service: General;  Laterality: N/A;   MASS BIOPSY Left 12/28/2023   Procedure: NECK MASS BIOPSY;  Surgeon: Jordis Laneta FALCON, MD;  Location: [facility] ORS;  Service: General;  Laterality: Left;   PORTACATH PLACEMENT N/A 12/28/2023   Procedure: INSERTION PORT-A-CATH;  Surgeon: Jordis Laneta FALCON, MD;  Location: [facility] OR[facility];  Service: General;  Laterality: N/A;    FAMILY HISTORY: family history includes Cancer in his sister; Colon cancer in his mother; Pancreatic cancer in his father.  SOCIAL HISTORY:  reports that he has quit smoking. His smoking use included cigarettes. He has been exposed to tobacco smoke. He has never used smokeless tobacco. He reports that he does not currently use alcohol. He reports that he does not currently use drugs after having used the following drugs: Cocaine.  ALLERGIES: Patient has no known allergies.  MEDICATIONS:  Current Outpatient Medications  Medication Sig Dispense Refill   lidocaine -prilocaine  (EMLA ) cream Apply 1 Application topically as needed. 30 g 2   No current facility-administered medications for this encounter.   Facility-Administered Medications Ordered in Other Encounters  Medication Dose Route Frequency Provider Last Rate Last Admin   0.9 %  sodium chloride  infusion   Intravenous Continuous Melanee Annah BROCKS, MD   Stopped at 01/29/24 1143   0.9 %  sodium chloride  infusion   Intravenous Continuous Melanee Annah BROCKS, MD   Stopped at 03/11/24 1228   0.9 %  sodium chloride   infusion   Intravenous Continuous Melanee Annah BROCKS, MD   Stopped at 04/01/24 1156   methotrexate  (PF) 12 mg, hydrocortisone  sodium succinate (SOLU-CORTEF ) 50 mg in sodium chloride  (PF) 0.9 % INTRATHECAL chemo injection   Intrathecal Once Rao, Archana C, MD       sodium chloride  flush (NS) 0.9 % injection 10 mL  10 mL Intracatheter PRN Melanee Annah BROCKS, MD   10 mL at 01/30/24 1254   sodium chloride  flush (NS) 0.9 % injection 10 mL  10 mL Intracatheter PRN Melanee Annah BROCKS, MD   10 mL at 04/03/24 1327    ECOG PERFORMANCE STATUS:  0 - Asymptomatic  REVIEW OF SYSTEMS: Patient denies any weight loss, fatigue, weakness, fever, chills or night sweats. Patient denies any loss of vision, blurred vision. Patient denies any ringing  of the ears or hearing loss. No irregular heartbeat. Patient denies heart murmur or history of fainting. Patient denies any chest pain or pain radiating to her upper extremities. Patient denies any shortness of breath, difficulty breathing at night, cough or hemoptysis. Patient denies any swelling in the lower legs. Patient denies any nausea vomiting, vomiting of blood, or coffee ground material in the vomitus. Patient denies any stomach pain. Patient states has had normal bowel movements no significant constipation or diarrhea. Patient denies any dysuria, hematuria or significant nocturia. Patient denies any problems walking, swelling in the joints or loss of balance. Patient denies any skin changes, loss of hair or loss of weight. Patient denies any excessive worrying or anxiety or significant depression. Patient denies any problems with insomnia. Patient denies excessive thirst, polyuria, polydipsia. Patient denies any swollen glands, patient denies easy bruising or easy bleeding. Patient denies any recent infections, allergies or URI. Patient s visual fields have not changed significantly in recent time.   PHYSICAL EXAM: BP 128/83   Pulse 85   Temp 97.7 F (36.5 C) (Tympanic)   Wt  176 lb (79.8 kg)   BMI 30.21 kg/m  Well-developed well-nourished patient in NAD. HEENT reveals PERLA, EOMI, discs not visualized.  Oral cavity is clear. No oral mucosal lesions are identified. Neck is clear without evidence of cervical or supraclavicular adenopathy. Lungs are clear to A&P. Cardiac examination is essentially unremarkable with regular rate and rhythm without murmur rub or thrill. Abdomen is benign with no organomegaly or masses noted. Motor sensory and DTR levels are equal and symmetric in the upper and lower extremities. Cranial nerves II through XII are grossly intact. Proprioception is intact. No peripheral adenopathy or edema is identified. No motor or sensory levels are noted. Crude visual fields are within normal range.  LABORATORY DATA: Pathology reports reviewed    RADIOLOGY RESULTS: Serial PET CT scans reviewed compatible with above-stated findings   IMPRESSION: Diffuse large B-cell lymphoma presenting with a large abdominal mass status post chemotherapy with still residual matted mass in the abdomen and slight hypermetabolic activity in 63 year old male.  PLAN: At this time based on the residual mass in his abdomen I believe it would be beneficial to treat with involved field radiation therapy.  We treated only the residual mass at this time.  Would treat to 3000 cGy over 3 weeks.  Risks and benefits of treatment including  possible diarrhea fatigue alteration of blood counts skin reaction slight chance of nausea all were reviewed with the patient and his daughter.  They would like to think about things I did review all other films with them and they will let us  know if he decides on pursuing treatment.  I would like to take this opportunity to thank you for allowing me to participate in the care of your patient.SABRA Marcey Penton, MD

## 2024-09-26 ENCOUNTER — Encounter: Payer: Self-pay | Admitting: Surgery

## 2024-09-26 NOTE — Progress Notes (Signed)
 Outpatient Surgical Follow Up    William Mathews is an 63 y.o. male.   Chief Complaint  Patient presents with   Procedrue    Port Removal     HPI: William Mathews is a 63 year old well-known to me with history of B-cell lymphoma status postchemotherapy.  Did have a large soft tissue mass along the mesentery as well as mediastinal and retroperitoneal nodes I did perform excisional biopsy of neck mass and port 9 months ago.  He is doing very well He is now here for consideration of port removal.  He did have a recent Repeat that I have personally reviewed PET CT scan at that time showed slightly hypermetabolic matted soft tissue mass in the small bowel mesentery a Deauville 2. There was focal hypermetabolic in the lobe midline the small bowel without CT , he is asymptomatic, no fevers, chills  History reviewed. No pertinent past medical history.  Past Surgical History:  Procedure Laterality Date   FISTULOTOMY N/A 01/10/2018   Procedure: FISTULOTOMY;  Surgeon: Rodolph Romano, MD;  Location: [facility];  Service: General;  Laterality: N/A;   MASS BIOPSY Left 12/28/2023   Procedure: NECK MASS BIOPSY;  Surgeon: Jordis Laneta FALCON, MD;  Location: [facility];  Service: General;  Laterality: Left;   PORTACATH PLACEMENT N/A 12/28/2023   Procedure: INSERTION PORT-A-CATH;  Surgeon: Jordis Laneta FALCON, MD;  Location: [facility];  Service: General;  Laterality: N/A;    Family History  Problem Relation Age of Onset   Colon cancer Mother    Pancreatic cancer Father    Cancer Sister     Social History:  reports that he has quit smoking. His smoking use included cigarettes. He has been exposed to tobacco smoke. He has never used smokeless tobacco. He reports that he does not currently use alcohol. He reports that he does not currently use drugs after having used the following drugs: Cocaine.  Allergies: No Known Allergies  Medications reviewed.    ROS Full ROS performed and is otherwise negative other than what  is stated in HPI   BP 125/84   Pulse 73   Ht 5' 4 (1.626 m)   Wt 174 lb (78.9 kg)   SpO2 98%   BMI 29.87 kg/m   Physical Exam Vitals and nursing note reviewed. Exam conducted with a chaperone present.  Constitutional:      Appearance: Normal appearance. He is normal weight.  Cardiovascular:     Comments: LEft chest wall port in place w/o infection Pulmonary:     Effort: Pulmonary effort is normal.     Breath sounds: No stridor.  Abdominal:     General: Abdomen is flat. There is no distension.     Palpations: Abdomen is soft. There is no mass.  Musculoskeletal:        General: Normal range of motion.     Cervical back: Normal range of motion and neck supple. No rigidity.  Skin:    General: Skin is warm and dry.     Capillary Refill: Capillary refill takes less than 2 seconds.  Neurological:     Mental Status: He is alert.  Psychiatric:        Mood and Affect: Mood normal.        Behavior: Behavior normal.        Thought Content: Thought content normal.        Judgment: Judgment normal.        Assessment/Plan:  63 year old male history of B-cell lymphoma completed chemoradiation  therapy.  He wishes to have port removed.  Procedure discussed with him and the daughter in detail.  Risk benefit and possible complications.  I do think that is reasonable to perform this here under local.  They are in agreement  I personally spent a total of 20 minutes in the care of the patient today including performing a medically appropriate exam/evaluation, counseling and educating, placing orders, referring and communicating with other health care professionals, documenting clinical information in the EHR, independently interpreting and reviewing images studies and coordinating care.    PROCEDURE NOTE 1. Excision of Right IJ port  ANESTHESIA: Lidocaine  1% w epi 9cc  EBL: minimal  Complications: none  After informed consent was obtained.  We placed the patient in the supine  position and prepped and draped in the usual sterile fashion.  An infraclavicular incision was created where the previous port was placed and we excised the subcutaneous tissue.  We identified the port on incises capsule.  We remove it from adjacent structures and cut the Prolene sutures.  We remove the port after asking the patient for a Valsalva and obtain hemostasis with pressure.  The wound was closed in a 2 layer fashion with 3-0 Vicryl and 4 Monocryl for the skin.  Dermabond was used to coat the skin.  There were no complications Laneta Luna, MD [facility] General Surgeon

## 2024-09-27 ENCOUNTER — Encounter: Payer: Self-pay | Admitting: Oncology

## 2024-10-07 ENCOUNTER — Other Ambulatory Visit: Payer: Self-pay

## 2024-10-21 ENCOUNTER — Encounter: Payer: Self-pay | Admitting: Oncology

## 2024-10-21 ENCOUNTER — Telehealth: Payer: Self-pay

## 2024-10-21 NOTE — Telephone Encounter (Signed)
 Voicemail received from [name] today 10/21/24 at 2:13PM inquiring if echo ordered in February is still needed; best call back number is [phone].  Outbound call spoke to [name] and informed patient is not on treatment, recently had port taken out; echo order cancelled.

## 2024-12-18 ENCOUNTER — Inpatient Hospital Stay: Attending: Oncology

## 2024-12-18 ENCOUNTER — Encounter: Payer: Self-pay | Admitting: Oncology

## 2024-12-18 ENCOUNTER — Inpatient Hospital Stay: Admitting: Oncology

## 2024-12-18 VITALS — BP 125/86 | HR 96 | Resp 19 | Ht 64.0 in | Wt 180.8 lb

## 2024-12-18 DIAGNOSIS — R7989 Other specified abnormal findings of blood chemistry: Secondary | ICD-10-CM | POA: Insufficient documentation

## 2024-12-18 DIAGNOSIS — R140 Abdominal distension (gaseous): Secondary | ICD-10-CM | POA: Diagnosis not present

## 2024-12-18 DIAGNOSIS — C8333 Diffuse large B-cell lymphoma, intra-abdominal lymph nodes: Secondary | ICD-10-CM

## 2024-12-18 DIAGNOSIS — Z79631 Long term (current) use of antimetabolite agent: Secondary | ICD-10-CM | POA: Diagnosis not present

## 2024-12-18 DIAGNOSIS — Z8579 Personal history of other malignant neoplasms of lymphoid, hematopoietic and related tissues: Secondary | ICD-10-CM

## 2024-12-18 DIAGNOSIS — C833A Diffuse large b-cell lymphoma, in remission: Secondary | ICD-10-CM | POA: Insufficient documentation

## 2024-12-18 DIAGNOSIS — Z79899 Other long term (current) drug therapy: Secondary | ICD-10-CM | POA: Diagnosis not present

## 2024-12-18 DIAGNOSIS — Z809 Family history of malignant neoplasm, unspecified: Secondary | ICD-10-CM | POA: Insufficient documentation

## 2024-12-18 DIAGNOSIS — R6881 Early satiety: Secondary | ICD-10-CM | POA: Diagnosis not present

## 2024-12-18 DIAGNOSIS — Z87891 Personal history of nicotine dependence: Secondary | ICD-10-CM | POA: Diagnosis not present

## 2024-12-18 DIAGNOSIS — Z800 Family history of malignant neoplasm of digestive organs: Secondary | ICD-10-CM | POA: Diagnosis not present

## 2024-12-18 DIAGNOSIS — R519 Headache, unspecified: Secondary | ICD-10-CM | POA: Diagnosis not present

## 2024-12-18 DIAGNOSIS — Z9221 Personal history of antineoplastic chemotherapy: Secondary | ICD-10-CM | POA: Diagnosis not present

## 2024-12-18 DIAGNOSIS — R109 Unspecified abdominal pain: Secondary | ICD-10-CM | POA: Diagnosis not present

## 2024-12-18 LAB — CBC WITH DIFFERENTIAL (CANCER CENTER ONLY)
Abs Immature Granulocytes: 0.02 K/uL (ref 0.00–0.07)
Basophils Absolute: 0 K/uL (ref 0.0–0.1)
Basophils Relative: 0 %
Eosinophils Absolute: 0.1 K/uL (ref 0.0–0.5)
Eosinophils Relative: 2 %
HCT: 38.9 % — ABNORMAL LOW (ref 39.0–52.0)
Hemoglobin: 13.7 g/dL (ref 13.0–17.0)
Immature Granulocytes: 0 %
Lymphocytes Relative: 20 %
Lymphs Abs: 1.2 K/uL (ref 0.7–4.0)
MCH: 29.7 pg (ref 26.0–34.0)
MCHC: 35.2 g/dL (ref 30.0–36.0)
MCV: 84.4 fL (ref 80.0–100.0)
Monocytes Absolute: 0.6 K/uL (ref 0.1–1.0)
Monocytes Relative: 10 %
Neutro Abs: 4.1 K/uL (ref 1.7–7.7)
Neutrophils Relative %: 68 %
Platelet Count: 196 K/uL (ref 150–400)
RBC: 4.61 MIL/uL (ref 4.22–5.81)
RDW: 13.3 % (ref 11.5–15.5)
WBC Count: 6 K/uL (ref 4.0–10.5)
nRBC: 0 % (ref 0.0–0.2)

## 2024-12-18 LAB — CMP (CANCER CENTER ONLY)
ALT: 36 U/L (ref 0–44)
AST: 28 U/L (ref 15–41)
Albumin: 4.4 g/dL (ref 3.5–5.0)
Alkaline Phosphatase: 93 U/L (ref 38–126)
Anion gap: 11 (ref 5–15)
BUN: 15 mg/dL (ref 8–23)
CO2: 24 mmol/L (ref 22–32)
Calcium: 9.3 mg/dL (ref 8.9–10.3)
Chloride: 103 mmol/L (ref 98–111)
Creatinine: 0.97 mg/dL (ref 0.61–1.24)
GFR, Estimated: >60 mL/min
Glucose, Bld: 98 mg/dL (ref 70–99)
Potassium: 3.8 mmol/L (ref 3.5–5.1)
Sodium: 139 mmol/L (ref 135–145)
Total Bilirubin: 0.3 mg/dL (ref 0.0–1.2)
Total Protein: 6.7 g/dL (ref 6.5–8.1)

## 2024-12-18 LAB — LACTATE DEHYDROGENASE: LDH: 157 U/L (ref 105–235)

## 2024-12-18 NOTE — Progress Notes (Signed)
 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  He had a CT abdomen and pelvis with contrast which showed large soft tissue mass along the mesentery measuring up to 21 cm with encasement of central mesenteric vessels and loss of tissue planes between the mass and several bowel loops.  Caliber change along the third portion of duodenum with partial mild obstruction.  Findings concerning for lymphoma.  Serum creatinine mildly elevated at 1.4.  Spleen slightly enlarged without intrinsic splenic mass.  Patient denies any significant nausea or vomiting.  He has early satiety   PET CT scan from 12/22/2023 showed Hypermetabolic left supraclavicular mediastinal retroperitoneal nodes along with large central mesenteric nodal mass consistent with lymphoma.  SUV in these areas ranging around SUV 16.  Central mesenteric nodal mass extending into the upper pelvis.  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  Cells positive for CD20 with coexpression of CD10 and BCL6 consistent with germinal center/folic killer phenotype and aberrantly express Bcl-2 strongly.  MOM1 and CD30 are  negative.  EBV ISH negative.  Proliferation ranges from 20 to 80% with overall average of 60 to 70%.  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       Patient received Pola R CHP chemotherapy for cycle 1.  Double hit testing showed make gene rearrangement at 75%. Although Bcl-2 and BCL6 rearrangement was detected on FISH these were not known partners to make and therefore it was reported as a pseudo double hit.  However given the fact that this is a transformed diffuse large B-cell lymphoma from a pre-existing follicular lymphoma this would fit the pattern of a double hit lymphoma/ high grade B cell lymphoma.  Plan was therefore made to switch him to dose adjusted R-EPOCH chemotherapy for the remainder of the cycles   Initially plan was not to offer CNS prophylaxis however given that his double hit lymphoma with an intermediate risk score of 3 intrathecal methotrexate  will be given if tolerated for 4 cycles.  Patient went through 2 cycles of intrathecal methotrexate  but following that it was stopped due to significant headache   Patient completed 6 cycles of dose adjusted R-EPOCH chemotherapy as an outpatient on 04/29/2024 and tolerated it without any significant side effects. PET CT scan after 6 cycles showed hypermetabolic matted soft tissue mass in the small bowel mesentery Deauville 2.  Focal hypermetabolic send in the low midline small bowel without CT correlate.  Clono- sequencing testing was negative at the end of treatment      Interval  history-history obtained with the help of Spanish interpreter William Mathews is a 64 year old male with diffuse large B-cell lymphoma of the intra-abdominal lymph nodes who presents for routine hematology-oncology follow-up.  He is currently asymptomatic and has returned to his  baseline level of health, including resuming weightlifting and maintaining a good appetite. He denies new pain, constitutional symptoms, or any difficulty swallowing, which was present at the time of initial lymphoma diagnosis.       ECOG PS- 0 Pain scale- 0   Review of systems- Review of Systems  Constitutional:  Negative for chills, fever, malaise/fatigue and weight loss.  HENT:  Negative for congestion, ear discharge and nosebleeds.   Eyes:  Negative for blurred vision.  Respiratory:  Negative for cough, hemoptysis, sputum production, shortness of breath and wheezing.   Cardiovascular:  Negative for chest pain, palpitations, orthopnea and claudication.  Gastrointestinal:  Negative for abdominal pain, blood in stool, constipation, diarrhea, heartburn, melena, nausea and vomiting.  Genitourinary:  Negative for dysuria, flank pain, frequency, hematuria and urgency.  Musculoskeletal:  Negative for back pain, joint pain and myalgias.  Skin:  Negative for rash.  Neurological:  Negative for dizziness, tingling, focal weakness, seizures, weakness and headaches.  Endo/Heme/Allergies:  Does not bruise/bleed easily.  Psychiatric/Behavioral:  Negative for depression and suicidal ideas. The patient does not have insomnia.       Allergies[1]   History reviewed. No pertinent past medical history.   Past Surgical History:  Procedure Laterality Date   FISTULOTOMY N/A 01/10/2018   Procedure: FISTULOTOMY;  Surgeon: Rodolph Romano, MD;  Location: [facility] ORS;  Service: General;  Laterality: N/A;   MASS BIOPSY Left 12/28/2023   Procedure: NECK MASS BIOPSY;  Surgeon: Jordis Laneta FALCON, MD;  Location: [facility] OR[facility];  Service: General;  Laterality: Left;   PORTACATH PLACEMENT N/A 12/28/2023   Procedure: INSERTION PORT-A-CATH;  Surgeon: Jordis Laneta FALCON, MD;  Location: [facility] OR[facility];  Service: General;  Laterality: N/A;    Social History   Socioeconomic History   Marital status: Single    Spouse name: Not  on file   Number of children: 4   Years of education: Not on file   Highest education level: Not on file  Occupational History   Not on file  Tobacco Use   Smoking status: Former    Types: Cigarettes    Passive exposure: Past   Smokeless tobacco: Never  Vaping Use   Vaping status: Never Used  Substance and Sexual Activity   Alcohol use: Not Currently   Drug use: Not Currently    Types: Cocaine    Comment: years ago    Sexual activity: Yes  Other Topics Concern   Not on file  Social History Narrative   Not on file   Social Drivers of Health   Tobacco Use: Medium Risk (12/18/2024)   Patient History    Smoking Tobacco Use: Former    Smokeless Tobacco Use: Never    Passive Exposure: Past  Physicist, Medical Strain: Not on file  Food Insecurity: No Food Insecurity (12/27/2023)   Hunger Vital Sign    Worried About Running Out of Food in the Last Year: Never true    Ran Out of Food in the Last Year: Never true  Transportation Needs: No Transportation Needs (12/27/2023)   PRAPARE - Administrator, Civil Service (Medical): No    Lack of Transportation (Non-Medical): No  Physical Activity: Inactive (12/27/2023)   Exercise Vital Sign    Days of Exercise  per Week: 0 days    Minutes of Exercise per Session: 0 min  Stress: Not on file  Social Connections: Not on file  Intimate Partner Violence: Not At Risk (12/27/2023)   Humiliation, Afraid, Rape, and Kick questionnaire    Fear of Current or Ex-Partner: No    Emotionally Abused: No    Physically Abused: No    Sexually Abused: No  Depression (PHQ2-9): Low Risk (12/18/2024)   Depression (PHQ2-9)    PHQ-2 Score: 0  Alcohol Screen: Not on file  Housing: Unknown (12/27/2023)   Housing Stability Vital Sign    Unable to Pay for Housing in the Last Year: No    Number of Times Moved in the Last Year: 0    Homeless in the Last Year: Not on file  Utilities: Not At Risk (12/27/2023)   [employer]HC Utilities    Threatened with loss of  utilities: No  Health Literacy: Not on file    Family History  Problem Relation Age of Onset   Colon cancer Mother    Pancreatic cancer Father    Cancer Sister     Current Medications[2]  Physical exam:  Vitals:   12/18/24 1029  BP: 125/86  Pulse: 96  Resp: 19  TempSrc: Tympanic  SpO2: 98%  Weight: 180 lb 12.8 oz (82 kg)  Height: 5' 4 (1.626 m)   Physical Exam Cardiovascular:     Rate and Rhythm: Normal rate and regular rhythm.     Heart sounds: Normal heart sounds.  Pulmonary:     Effort: Pulmonary effort is normal.     Breath sounds: Normal breath sounds.  Abdominal:     General: Bowel sounds are normal. There is no distension.     Palpations: Abdomen is soft.     Tenderness: There is no abdominal tenderness.  Lymphadenopathy:     Comments: No palpable cervical, supraclavicular, axillary or inguinal adenopathy    Skin:    General: Skin is warm and dry.  Neurological:     Mental Status: He is alert and oriented to person, place, and time.      I have personally reviewed labs listed below:    Latest Ref Rng & Units 09/18/2024   10:59 AM  CMP  Glucose 70 - 99 mg/dL 897   BUN 8 - 23 mg/dL 15   Creatinine 9.38 - 1.24 mg/dL 9.04   Sodium 864 - 854 mmol/L 134   Potassium 3.5 - 5.1 mmol/L 4.0   Chloride 98 - 111 mmol/L 103   CO2 22 - 32 mmol/L 22   Calcium 8.9 - 10.3 mg/dL 8.8   Total Protein 6.5 - 8.1 g/dL 6.9   Total Bilirubin 0.0 - 1.2 mg/dL 0.7   Alkaline Phos 38 - 126 U/L 74   AST 15 - 41 U/L 27   ALT 0 - 44 U/L 25       Latest Ref Rng & Units 12/18/2024   10:15 AM  CBC  WBC 4.0 - 10.5 K/uL 6.0   Hemoglobin 13.0 - 17.0 g/dL 86.2   Hematocrit 60.9 - 52.0 % 38.9   Platelets 150 - 400 K/uL 196     Assessment and plan- Patient is a 64 y.o. male with his diffuse large B-cell lymphoma s/p 6 cycles of dose adjusted R-EPOCH chemotherapy currently in CR 1 here for routine follow-up  Assessment and Plan    Diffuse large B-cell lymphoma of  intra-abdominal lymph nodes s/p 6 cycles of dose adjusted R EPOCH chemotherapy  currently in CR 1  - Clinically patient is doing well with no concerning signs and symptoms of recurrence based on today's exam.  He does not have any B symptoms and clinically does not have any palpable adenopathy or hepatosplenomegaly. Asymptomatic with no evidence of recurrence. Normal blood counts. - - Repeat PET scan in three months along with CBC with differential CMP LDH and Clono sequencing testing - Follow-up visit scheduled two weeks post-PET scan.         Visit Diagnosis 1. Encounter for follow-up surveillance of diffuse large B-cell lymphoma   2. Diffuse large B-cell lymphoma of intra-abdominal lymph nodes (HCC)      Dr. Annah Skene, MD, MPH [facility] [phone] 12/18/2024 11:01 AM                   [1] No Known Allergies [2]  Current Outpatient Medications:    lidocaine -prilocaine  (EMLA ) cream, Apply 1 Application topically as needed., Disp: 30 g, Rfl: 2 No current facility-administered medications for this visit.  Facility-Administered Medications Ordered in Other Visits:    0.9 %  sodium chloride  infusion, , Intravenous, Continuous, Skene Annah BROCKS, MD, Stopped at 01/29/24 1143   0.9 %  sodium chloride  infusion, , Intravenous, Continuous, Skene Annah BROCKS, MD, Stopped at 03/11/24 1228   0.9 %  sodium chloride  infusion, , Intravenous, Continuous, Skene Annah BROCKS, MD, Stopped at 04/01/24 1156   methotrexate  (PF) 12 mg, hydrocortisone  sodium succinate (SOLU-CORTEF ) 50 mg in sodium chloride  (PF) 0.9 % INTRATHECAL chemo injection, , Intrathecal, Once, Skene Annah BROCKS, MD   sodium chloride  flush (NS) 0.9 % injection 10 mL, 10 mL, Intracatheter, PRN, Skene Annah BROCKS, MD, 10 mL at 01/30/24 1254   sodium chloride  flush (NS) 0.9 % injection 10 mL, 10 mL, Intracatheter, PRN, Skene Annah BROCKS, MD, 10 mL at 04/03/24 1327  "

## 2024-12-18 NOTE — Progress Notes (Signed)
 Patient doing good; no new or acute concerns at this [email or web address]

## 2024-12-19 ENCOUNTER — Other Ambulatory Visit: Payer: Self-pay

## 2025-03-17 ENCOUNTER — Other Ambulatory Visit

## 2025-03-31 ENCOUNTER — Inpatient Hospital Stay

## 2025-03-31 ENCOUNTER — Inpatient Hospital Stay: Admitting: Oncology
